# Patient Record
Sex: Female | Born: 1945 | Race: White | Hispanic: No | Marital: Married | Smoking: Former smoker
Health system: Southern US, Community
[De-identification: ages and names within clinical notes are randomized; demographics above are authoritative.]

## PROBLEM LIST (undated history)

## (undated) DIAGNOSIS — U071 COVID-19: Secondary | ICD-10-CM

## (undated) DIAGNOSIS — Z91018 Allergy to other foods: Secondary | ICD-10-CM

## (undated) DIAGNOSIS — I7300 Raynaud's syndrome without gangrene: Secondary | ICD-10-CM

## (undated) DIAGNOSIS — I872 Venous insufficiency (chronic) (peripheral): Secondary | ICD-10-CM

## (undated) DIAGNOSIS — E785 Hyperlipidemia, unspecified: Secondary | ICD-10-CM

## (undated) DIAGNOSIS — M359 Systemic involvement of connective tissue, unspecified: Secondary | ICD-10-CM

## (undated) DIAGNOSIS — R7989 Other specified abnormal findings of blood chemistry: Secondary | ICD-10-CM

## (undated) DIAGNOSIS — R945 Abnormal results of liver function studies: Secondary | ICD-10-CM

## (undated) DIAGNOSIS — I739 Peripheral vascular disease, unspecified: Secondary | ICD-10-CM

## (undated) DIAGNOSIS — J439 Emphysema, unspecified: Secondary | ICD-10-CM

## (undated) DIAGNOSIS — K648 Other hemorrhoids: Secondary | ICD-10-CM

## (undated) DIAGNOSIS — T7840XA Allergy, unspecified, initial encounter: Secondary | ICD-10-CM

## (undated) DIAGNOSIS — K635 Polyp of colon: Secondary | ICD-10-CM

## (undated) DIAGNOSIS — D6852 Prothrombin gene mutation: Secondary | ICD-10-CM

## (undated) DIAGNOSIS — IMO0002 Reserved for concepts with insufficient information to code with codable children: Secondary | ICD-10-CM

## (undated) DIAGNOSIS — I776 Arteritis, unspecified: Secondary | ICD-10-CM

## (undated) DIAGNOSIS — M1990 Unspecified osteoarthritis, unspecified site: Secondary | ICD-10-CM

## (undated) DIAGNOSIS — J45909 Unspecified asthma, uncomplicated: Secondary | ICD-10-CM

## (undated) DIAGNOSIS — R87619 Unspecified abnormal cytological findings in specimens from cervix uteri: Secondary | ICD-10-CM

## (undated) DIAGNOSIS — J189 Pneumonia, unspecified organism: Secondary | ICD-10-CM

## (undated) DIAGNOSIS — I10 Essential (primary) hypertension: Secondary | ICD-10-CM

## (undated) HISTORY — DX: Polyp of colon: K63.5

## (undated) HISTORY — PX: DILATION AND CURETTAGE OF UTERUS: SHX78

## (undated) HISTORY — DX: Unspecified abnormal cytological findings in specimens from cervix uteri: R87.619

## (undated) HISTORY — DX: Other hemorrhoids: K64.8

## (undated) HISTORY — PX: OTHER SURGICAL HISTORY: SHX169

## (undated) HISTORY — DX: Hyperlipidemia, unspecified: E78.5

## (undated) HISTORY — DX: Arteritis, unspecified: I77.6

## (undated) HISTORY — PX: COLONOSCOPY: SHX174

## (undated) HISTORY — PX: ABLATION SAPHENOUS VEIN W/ RFA: SUR11

## (undated) HISTORY — DX: Reserved for concepts with insufficient information to code with codable children: IMO0002

## (undated) HISTORY — DX: Prothrombin gene mutation: D68.52

## (undated) HISTORY — DX: Abnormal results of liver function studies: R94.5

## (undated) HISTORY — DX: Peripheral vascular disease, unspecified: I73.9

## (undated) HISTORY — DX: Other specified abnormal findings of blood chemistry: R79.89

## (undated) HISTORY — DX: Venous insufficiency (chronic) (peripheral): I87.2

## (undated) HISTORY — DX: Unspecified osteoarthritis, unspecified site: M19.90

## (undated) HISTORY — PX: PELVIC LAPAROSCOPY: SHX162

## (undated) HISTORY — DX: Systemic involvement of connective tissue, unspecified: M35.9

## (undated) HISTORY — PX: MASS EXCISION: SHX2000

## (undated) HISTORY — PX: LASER ABLATION: SHX1947

## (undated) HISTORY — DX: Raynaud's syndrome without gangrene: I73.00

## (undated) HISTORY — DX: Essential (primary) hypertension: I10

## (undated) HISTORY — DX: Allergy, unspecified, initial encounter: T78.40XA

---

## 1968-03-31 HISTORY — PX: CERVIX LESION DESTRUCTION: SHX591

## 1977-03-31 HISTORY — PX: ABDOMINAL HYSTERECTOMY: SHX81

## 2000-04-09 ENCOUNTER — Encounter: Admission: RE | Admit: 2000-04-09 | Discharge: 2000-04-09 | Payer: Self-pay | Admitting: Neurology

## 2000-04-09 ENCOUNTER — Encounter: Payer: Self-pay | Admitting: Neurology

## 2000-06-18 ENCOUNTER — Other Ambulatory Visit: Admission: RE | Admit: 2000-06-18 | Discharge: 2000-06-18 | Payer: Self-pay | Admitting: Obstetrics and Gynecology

## 2001-07-09 ENCOUNTER — Encounter: Payer: Self-pay | Admitting: Internal Medicine

## 2002-09-22 ENCOUNTER — Encounter: Payer: Self-pay | Admitting: Internal Medicine

## 2002-09-22 ENCOUNTER — Encounter: Admission: RE | Admit: 2002-09-22 | Discharge: 2002-09-22 | Payer: Self-pay | Admitting: Internal Medicine

## 2002-12-12 ENCOUNTER — Encounter: Payer: Self-pay | Admitting: Internal Medicine

## 2004-11-11 ENCOUNTER — Ambulatory Visit: Payer: Self-pay | Admitting: Internal Medicine

## 2004-11-13 ENCOUNTER — Ambulatory Visit: Payer: Self-pay | Admitting: Internal Medicine

## 2005-06-17 ENCOUNTER — Ambulatory Visit: Payer: Self-pay | Admitting: Internal Medicine

## 2006-01-27 ENCOUNTER — Ambulatory Visit: Payer: Self-pay | Admitting: Internal Medicine

## 2006-01-27 LAB — CONVERTED CEMR LAB
ALT: 22 units/L (ref 0–40)
AST: 24 units/L (ref 0–37)
Albumin: 3.4 g/dL — ABNORMAL LOW (ref 3.5–5.2)
Alkaline Phosphatase: 55 units/L (ref 39–117)
BUN: 12 mg/dL (ref 6–23)
Basophils Absolute: 0 10*3/uL (ref 0.0–0.1)
Basophils Relative: 0.1 % (ref 0.0–1.0)
CO2: 28 meq/L (ref 19–32)
Calcium: 9.1 mg/dL (ref 8.4–10.5)
Chloride: 107 meq/L (ref 96–112)
Chol/HDL Ratio, serum: 4.3
Cholesterol: 207 mg/dL (ref 0–200)
Creatinine, Ser: 1 mg/dL (ref 0.4–1.2)
Eosinophil percent: 3.1 % (ref 0.0–5.0)
GFR calc non Af Amer: 61 mL/min
Glomerular Filtration Rate, Af Am: 73 mL/min/{1.73_m2}
Glucose, Bld: 83 mg/dL (ref 70–99)
HCT: 38.1 % (ref 36.0–46.0)
HDL: 47.8 mg/dL (ref >39.0)
Hemoglobin: 12.9 g/dL (ref 12.0–15.0)
LDL DIRECT: 133.6 mg/dL
Lymphocytes Relative: 47.9 % — ABNORMAL HIGH (ref 12.0–46.0)
MCHC: 33.7 g/dL (ref 30.0–36.0)
MCV: 104 fL — ABNORMAL HIGH (ref 78.0–100.0)
Monocytes Absolute: 0.4 10*3/uL (ref 0.2–0.7)
Monocytes Relative: 10.8 % (ref 3.0–11.0)
Neutro Abs: 1.4 10*3/uL (ref 1.4–7.7)
Neutrophils Relative %: 38.1 % — ABNORMAL LOW (ref 43.0–77.0)
Platelets: 283 10*3/uL (ref 150–400)
Potassium: 4 meq/L (ref 3.5–5.1)
RBC: 3.66 M/uL — ABNORMAL LOW (ref 3.87–5.11)
RDW: 12.5 % (ref 11.5–14.6)
Sodium: 142 meq/L (ref 135–145)
TSH: 1.8 microintl units/mL (ref 0.35–5.50)
Total Bilirubin: 0.6 mg/dL (ref 0.3–1.2)
Total Protein: 7.1 g/dL (ref 6.0–8.3)
Triglyceride fasting, serum: 78 mg/dL (ref 0–149)
VLDL: 16 mg/dL (ref 0–40)
WBC: 3.8 10*3/uL — ABNORMAL LOW (ref 4.5–10.5)

## 2006-02-03 ENCOUNTER — Ambulatory Visit: Payer: Self-pay | Admitting: Internal Medicine

## 2006-02-03 LAB — CONVERTED CEMR LAB
Folate: 13.3 ng/mL
Vitamin B-12: 262 pg/mL (ref 211–911)

## 2006-03-31 HISTORY — PX: OTHER SURGICAL HISTORY: SHX169

## 2006-04-21 ENCOUNTER — Ambulatory Visit: Payer: Self-pay | Admitting: Internal Medicine

## 2006-05-08 ENCOUNTER — Ambulatory Visit: Payer: Self-pay | Admitting: Internal Medicine

## 2006-09-29 ENCOUNTER — Ambulatory Visit: Payer: Self-pay

## 2006-10-09 ENCOUNTER — Ambulatory Visit: Payer: Self-pay | Admitting: Internal Medicine

## 2006-10-16 ENCOUNTER — Ambulatory Visit: Payer: Self-pay | Admitting: Internal Medicine

## 2006-10-19 ENCOUNTER — Ambulatory Visit: Payer: Self-pay | Admitting: Internal Medicine

## 2006-10-19 LAB — CONVERTED CEMR LAB
Basophils Absolute: 0 10*3/uL (ref 0.0–0.1)
Basophils Relative: 1.1 % — ABNORMAL HIGH (ref 0.0–1.0)
ENA SM Ab Ser-aCnc: <0.2 (ref <1.0)
Eosinophils Absolute: 0.2 10*3/uL (ref 0.0–0.6)
Eosinophils Relative: 3.8 % (ref 0.0–5.0)
HCT: 38.8 % (ref 36.0–46.0)
Hemoglobin: 13.3 g/dL (ref 12.0–15.0)
Lymphocytes Relative: 46.4 % — ABNORMAL HIGH (ref 12.0–46.0)
MCHC: 34.3 g/dL (ref 30.0–36.0)
MCV: 102.5 fL — ABNORMAL HIGH (ref 78.0–100.0)
Monocytes Absolute: 0.5 10*3/uL (ref 0.2–0.7)
Monocytes Relative: 10.3 % (ref 3.0–11.0)
Neutro Abs: 1.7 10*3/uL (ref 1.4–7.7)
Neutrophils Relative %: 38.4 % — ABNORMAL LOW (ref 43.0–77.0)
Platelets: 296 10*3/uL (ref 150–400)
RBC: 3.78 M/uL — ABNORMAL LOW (ref 3.87–5.11)
RDW: 13.1 % (ref 11.5–14.6)
Scleroderma (Scl-70) (ENA) Antibody, IgG: <0.2 (ref <1.0)
WBC: 4.4 10*3/uL — ABNORMAL LOW (ref 4.5–10.5)
ds DNA Ab: <1 (ref <5)

## 2006-10-20 ENCOUNTER — Encounter: Payer: Self-pay | Admitting: Internal Medicine

## 2006-10-26 ENCOUNTER — Encounter: Payer: Self-pay | Admitting: Internal Medicine

## 2006-10-30 ENCOUNTER — Ambulatory Visit: Payer: Self-pay | Admitting: Vascular Surgery

## 2006-10-30 ENCOUNTER — Encounter: Payer: Self-pay | Admitting: Internal Medicine

## 2006-11-10 ENCOUNTER — Telehealth: Payer: Self-pay | Admitting: Internal Medicine

## 2006-11-18 ENCOUNTER — Ambulatory Visit: Payer: Self-pay | Admitting: Internal Medicine

## 2006-11-19 ENCOUNTER — Telehealth: Payer: Self-pay | Admitting: Internal Medicine

## 2006-11-19 DIAGNOSIS — I10 Essential (primary) hypertension: Secondary | ICD-10-CM | POA: Insufficient documentation

## 2007-01-06 ENCOUNTER — Encounter: Payer: Self-pay | Admitting: Internal Medicine

## 2007-07-07 ENCOUNTER — Encounter: Payer: Self-pay | Admitting: Internal Medicine

## 2007-09-29 ENCOUNTER — Encounter: Payer: Self-pay | Admitting: Internal Medicine

## 2007-10-12 ENCOUNTER — Ambulatory Visit: Payer: Self-pay | Admitting: Internal Medicine

## 2007-10-22 ENCOUNTER — Ambulatory Visit: Payer: Self-pay | Admitting: Internal Medicine

## 2007-11-23 ENCOUNTER — Encounter: Payer: Self-pay | Admitting: Internal Medicine

## 2007-11-29 ENCOUNTER — Ambulatory Visit: Payer: Self-pay | Admitting: Internal Medicine

## 2007-11-29 DIAGNOSIS — I999 Unspecified disorder of circulatory system: Secondary | ICD-10-CM | POA: Insufficient documentation

## 2007-12-15 ENCOUNTER — Encounter: Payer: Self-pay | Admitting: Internal Medicine

## 2008-07-12 ENCOUNTER — Encounter: Payer: Self-pay | Admitting: Internal Medicine

## 2008-11-08 ENCOUNTER — Encounter (INDEPENDENT_AMBULATORY_CARE_PROVIDER_SITE_OTHER): Payer: Self-pay | Admitting: *Deleted

## 2009-01-24 ENCOUNTER — Emergency Department (HOSPITAL_COMMUNITY): Admission: EM | Admit: 2009-01-24 | Discharge: 2009-01-24 | Payer: Self-pay | Admitting: Emergency Medicine

## 2009-01-24 ENCOUNTER — Ambulatory Visit: Payer: Self-pay | Admitting: Internal Medicine

## 2009-01-24 DIAGNOSIS — Z860100 Personal history of colon polyps, unspecified: Secondary | ICD-10-CM | POA: Insufficient documentation

## 2009-01-24 DIAGNOSIS — K648 Other hemorrhoids: Secondary | ICD-10-CM

## 2009-01-24 HISTORY — DX: Other hemorrhoids: K64.8

## 2009-01-25 ENCOUNTER — Encounter: Payer: Self-pay | Admitting: Internal Medicine

## 2009-01-25 ENCOUNTER — Ambulatory Visit: Payer: Self-pay | Admitting: Internal Medicine

## 2009-01-26 LAB — CONVERTED CEMR LAB
Alkaline Phosphatase: 52 units/L (ref 39–117)
Amylase: 109 units/L (ref 27–131)
Bilirubin, Direct: 0.1 mg/dL (ref 0.0–0.3)
Sed Rate: 46 mm/hr — ABNORMAL HIGH (ref 0–22)

## 2009-01-29 ENCOUNTER — Telehealth: Payer: Self-pay | Admitting: Internal Medicine

## 2009-02-05 ENCOUNTER — Encounter: Payer: Self-pay | Admitting: Internal Medicine

## 2009-02-14 ENCOUNTER — Ambulatory Visit: Payer: Self-pay | Admitting: Internal Medicine

## 2009-02-15 LAB — CONVERTED CEMR LAB
AST: 62 units/L — ABNORMAL HIGH (ref 0–37)
Folate: 20 ng/mL
Vitamin B-12: 463 pg/mL (ref 211–911)

## 2009-03-07 ENCOUNTER — Telehealth: Payer: Self-pay | Admitting: Internal Medicine

## 2009-03-21 ENCOUNTER — Ambulatory Visit: Payer: Self-pay | Admitting: Internal Medicine

## 2009-04-09 ENCOUNTER — Ambulatory Visit: Payer: Self-pay | Admitting: Internal Medicine

## 2009-05-04 ENCOUNTER — Ambulatory Visit: Payer: Self-pay | Admitting: Internal Medicine

## 2009-05-07 LAB — CONVERTED CEMR LAB
ALT: 84 units/L — ABNORMAL HIGH (ref 0–35)
AST: 60 units/L — ABNORMAL HIGH (ref 0–37)
Alkaline Phosphatase: 61 units/L (ref 39–117)
Bilirubin, Direct: 0.1 mg/dL (ref 0.0–0.3)
Iron: 117 ug/dL (ref 42–145)
Saturation Ratios: 38.1 % (ref 20.0–50.0)
Total Bilirubin: 0.6 mg/dL (ref 0.3–1.2)
Total Protein: 7.3 g/dL (ref 6.0–8.3)

## 2009-05-09 ENCOUNTER — Encounter: Payer: Self-pay | Admitting: Internal Medicine

## 2009-08-29 ENCOUNTER — Telehealth: Payer: Self-pay | Admitting: Internal Medicine

## 2009-09-12 ENCOUNTER — Telehealth: Payer: Self-pay | Admitting: Internal Medicine

## 2009-09-18 ENCOUNTER — Encounter: Payer: Self-pay | Admitting: Internal Medicine

## 2009-09-18 ENCOUNTER — Telehealth: Payer: Self-pay | Admitting: Internal Medicine

## 2009-09-27 ENCOUNTER — Telehealth: Payer: Self-pay | Admitting: Internal Medicine

## 2009-10-19 ENCOUNTER — Ambulatory Visit: Payer: Self-pay | Admitting: Internal Medicine

## 2009-10-20 LAB — CONVERTED CEMR LAB
ALT: 65 units/L — ABNORMAL HIGH (ref 0–35)
AST: 48 units/L — ABNORMAL HIGH (ref 0–37)
Albumin: 3.6 g/dL (ref 3.5–5.2)
Alkaline Phosphatase: 57 units/L (ref 39–117)
Bilirubin, Direct: 0.1 mg/dL (ref 0.0–0.3)
Total Protein: 6.7 g/dL (ref 6.0–8.3)

## 2009-10-23 ENCOUNTER — Encounter: Payer: Self-pay | Admitting: Internal Medicine

## 2009-10-29 LAB — CONVERTED CEMR LAB
IgA: 480 mg/dL — ABNORMAL HIGH (ref 68–378)
IgG (Immunoglobin G), Serum: 1370 mg/dL (ref 694–1618)

## 2009-11-19 ENCOUNTER — Encounter: Admission: RE | Admit: 2009-11-19 | Discharge: 2009-11-19 | Payer: Self-pay | Admitting: Obstetrics and Gynecology

## 2009-11-20 ENCOUNTER — Encounter: Admission: RE | Admit: 2009-11-20 | Discharge: 2009-11-20 | Payer: Self-pay | Admitting: Obstetrics and Gynecology

## 2009-12-24 ENCOUNTER — Telehealth: Payer: Self-pay | Admitting: Internal Medicine

## 2010-02-04 ENCOUNTER — Ambulatory Visit: Payer: Self-pay | Admitting: Internal Medicine

## 2010-02-04 DIAGNOSIS — E785 Hyperlipidemia, unspecified: Secondary | ICD-10-CM | POA: Insufficient documentation

## 2010-02-04 LAB — CONVERTED CEMR LAB
Folate: 15.3 ng/mL
Vitamin B-12: 393 pg/mL (ref 211–911)

## 2010-02-05 LAB — CONVERTED CEMR LAB
AST: 54 units/L — ABNORMAL HIGH (ref 0–37)
Alkaline Phosphatase: 73 units/L (ref 39–117)
BUN: 17 mg/dL (ref 6–23)
CO2: 29 meq/L (ref 19–32)
Creatinine, Ser: 1.2 mg/dL (ref 0.4–1.2)
Eosinophils Relative: 1.8 % (ref 0.0–5.0)
Glucose, Bld: 82 mg/dL (ref 70–99)
HCT: 41.6 % (ref 36.0–46.0)
LDL Cholesterol: 67 mg/dL (ref 0–99)
Monocytes Absolute: 0.5 10*3/uL (ref 0.1–1.0)
Total CHOL/HDL Ratio: 2
Triglycerides: 70 mg/dL (ref 0.0–149.0)
VLDL: 14 mg/dL (ref 0.0–40.0)

## 2010-04-21 ENCOUNTER — Encounter: Payer: Self-pay | Admitting: Internal Medicine

## 2010-04-28 LAB — CONVERTED CEMR LAB
AST: 31 units/L (ref 0–37)
Alkaline Phosphatase: 59 units/L (ref 39–117)
Bilirubin, Direct: 0.1 mg/dL (ref 0.0–0.3)
Calcium: 9.8 mg/dL (ref 8.4–10.5)
Chloride: 106 meq/L (ref 96–112)
Creatinine, Ser: 1.1 mg/dL (ref 0.4–1.2)
Eosinophils Absolute: 0 10*3/uL (ref 0.0–0.7)
Eosinophils Relative: 1.3 % (ref 0.0–5.0)
GFR calc Af Amer: 65 mL/min
GFR calc non Af Amer: 54 mL/min
Lymphocytes Relative: 43.1 % (ref 12.0–46.0)
MCV: 103.8 fL — ABNORMAL HIGH (ref 78.0–100.0)
Neutrophils Relative %: 45.7 % (ref 43.0–77.0)
Potassium: 4.3 meq/L (ref 3.5–5.1)
RBC: 3.44 M/uL — ABNORMAL LOW (ref 3.87–5.11)
RDW: 13.4 % (ref 11.5–14.6)
Total Bilirubin: 0.5 mg/dL (ref 0.3–1.2)
Total Protein: 7.5 g/dL (ref 6.0–8.3)
Triglycerides: 56 mg/dL (ref 0–149)
VLDL: 11 mg/dL (ref 0–40)
WBC: 3.2 10*3/uL — ABNORMAL LOW (ref 4.5–10.5)

## 2010-04-30 NOTE — Letter (Signed)
Summary: Need [facility]  [address] [place], [place] [address]   Phone: [phone]  Fax: [phone]              September 18, 2009 MRN: [id number]    Kari Welch [address] [place], [place]  [address]    Dear Ms. Jambor,  Our office has made several attempts to reach you in regards to coming in for follow up labs to check on your liver. To date, we still have not seen any labwork completed. It is extremely important that we follow these tests especially in view of the fact that you have had chronic elevation of these tests previously as well as a history of pancreatitis. Please come for this labwork as soon as possible or call our office should you have any questions. We look forward in continuing to participate in your healthcare needs.  Sincerely,     Hedwig Morton. Juanda Chance, MD

## 2010-04-30 NOTE — Progress Notes (Signed)
Summary: tetanus?  Phone Note Call from Patient Call back at Home Phone [phone] Call back at [facility]   Caller: vm Summary of Call: Last tetanus shot.   None on record.  Patient aware.  Says she will get it tomorrow at [facility] appointment.  Asked her to call us back when she has had it, so we can get it on our record also.  Rudy Jew, RN  December 24, 2009 12:43 PM  Initial call taken by: Rudy Jew, RN,  December 24, 2009 9:52 AM

## 2010-04-30 NOTE — Assessment & Plan Note (Signed)
Summary: ears clogged/njr   Vital Signs:  Patient profile:   65 year old female Weight:      130 pounds Temp:     97.7 degrees F Pulse rate:   64 / minute Resp:     12 per minute BP sitting:   114 / 70  (left arm)  Vitals Entered By: Gladis Riffle, RN (April 09, 2009 10:34 AM)   Primary Care Provider:  Birdie Sons, MD   History of Present Illness: feels as if ears are clogged she uses q-tips frequently admits to decreased hearing needs somee meds refilled  .ncoo   Preventive Screening-Counseling & Management  Alcohol-Tobacco     Smoking Status: quit     Year Quit: 1985  Current Problems (verified): 1)  Internal Hemorrhoids  (ICD-455.0) 2)  Colonic Polyps, Hyperplastic, Hx of  (ICD-V12.72) 3)  Hx of Splenic Infarction  (ICD-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 Take One By Mouth Once Daily 2)  Felodipine 10 Mg Xr24h-Tab (Felodipine) .... One Tablet By Mouth Two Times A Day 3)  Triamterene-Hctz 37.5-25 Mg Caps (Triamterene-Hctz) .... Take 1 Capsule By Mouth Once A Day 4)  Baby Aspirin 81 Mg  Chew (Aspirin) .... One Qid 5)  Vivelle-Dot 0.05 Mg/24hr Pttw (Estradiol) .... Change Twice Weekly 6)  Pletal 100 Mg  Tabs (Cilostazol) .Marland Kitchen.. 1 By Mouth Two Times A Day 7)  Zocor 40 Mg Tabs (Simvastatin) .Marland Kitchen.. 1 By Mouth Twice Weekly 8)  Isosorbide Mononitrate Cr 60 Mg Tb24 (Isosorbide Mononitrate) .Marland Kitchen.. 1 By Mouth Once Daily 9)  Daily Multiple Vitamins  Tabs (Multiple Vitamin) .... Once Daily 10)  Vitamin D 1000 Unit Tabs (Cholecalciferol) .... One Tablet By Mouth Once Daily 11)  Klor-Con M20 20 Meq Cr-Tabs (Potassium Chloride Crys Cr) .... One Tablet By Mouth Once Daily 12)  Hydrocodone-Acetaminophen 5-500 Mg Tabs (Hydrocodone-Acetaminophen) .... Take One Every Eight Hours As Needed Migraines, Back Pain or Leg Pain 13)   Alprazolam 0.5 Mg Tabs (Alprazolam) .... Take 1 Tablet By Mouth Once A Day As Needed  Allergies: 1)  ! Prednisone (Prednisone)  Comments:  Nurse/Medical Assistant: c/o bilateral ear clooging x 1 month  The patient's medications and allergies were reviewed with the patient and were updated in the Medication and Allergy Lists. Gladis Riffle, RN (April 09, 2009 10:35 AM)  Past History:  Past Medical History: Last updated: 01/24/2009 she is being evaluated for possibility of vasculitis Hypertension Headache, migraine  Family Hx of COLON CANCER (ICD-153.9) INTERNAL HEMORRHOIDS (ICD-455.0) COLONIC POLYPS, HYPERPLASTIC, HX OF (ICD-V12.72) Hx of SPLENIC INFARCTION (ICD-289.59) BACK PAIN (ICD-724.5) VASCULITIS (ICD-447.6) HEADACHE (ICD-784.0) HYPERTENSION, ESSENTIAL NOS (ICD-401.9) PAIN, EXTREMITY (ICD-729.5)    Past Surgical History: Last updated: 01/24/2009 Hysterectomy--fibroids, ovarian cyst (2/3 one ovary remaining) 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 Excision of mass in left hand sympthectomy left hand  Family History: Last updated: 02/14/2009 Family History of Stroke F 1st degree relative <60--in 60's Family History of Stroke M 1st degree relative <50--in 60's 2 brothers, 1 sister healthy Family History of Colon Cancer : Mother  Social History: Last updated: 01/24/2009 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 Head:  normocephalic and atraumatic.   Ears:  bilateral cerumen impaction Skin:  turgor normal and color normal.     Impression & Recommendations:  Problem # 1:  CERUMEN IMPACTION (ICD-380.4) discussed ears---bilateral successfully  irrigated discussed avoiding q-tips  Complete Medication List: 1)  Fluoxetine Hcl 20 Mg Caps (Fluoxetine hcl) .... Take one by mouth once daily 2)  Felodipine 10 Mg Xr24h-tab (Felodipine) .... One tablet by mouth two times a day 3)  Triamterene-hctz 37.5-25 Mg Caps (Triamterene-hctz) .... Take 1 capsule by mouth once a day 4)  Baby Aspirin 81 Mg Chew (Aspirin) .... One qid 5)  Vivelle-dot 0.05 Mg/24hr Pttw (Estradiol) .... Change twice weekly 6)  Pletal 100 Mg Tabs (Cilostazol) .Marland Kitchen.. 1 by mouth two times a day 7)  Zocor 40 Mg Tabs (Simvastatin) .Marland Kitchen.. 1 by mouth twice weekly 8)  Isosorbide Mononitrate Cr 60 Mg Tb24 (Isosorbide mononitrate) .Marland Kitchen.. 1 by mouth once daily 9)  Daily Multiple Vitamins Tabs (Multiple vitamin) .... Once daily 10)  Vitamin D 1000 Unit Tabs (Cholecalciferol) .... One tablet by mouth once daily 11)  Klor-con M20 20 Meq Cr-tabs (Potassium chloride crys cr) .... One tablet by mouth once daily 12)  Hydrocodone-acetaminophen 5-500 Mg Tabs (Hydrocodone-acetaminophen) .... Take one every eight hours as needed migraines, back pain or leg pain 13)  Alprazolam 0.5 Mg Tabs (Alprazolam) .... Take 1 tablet by mouth once a day as needed Prescriptions: ALPRAZOLAM 0.5 MG TABS (ALPRAZOLAM) Take 1 tablet by mouth once a day as needed  #30 x 0   Entered by:   Gladis Riffle, RN   Authorized by:   Birdie Sons MD   Signed by:   Gladis Riffle, RN on 04/09/2009   Method used:   Print then Give to Patient   RxID:   [id number]

## 2010-04-30 NOTE — Assessment & Plan Note (Signed)
Summary: fup on meds//ccm   Vital Signs:  Patient profile:   65 year old female Weight:      133 pounds BMI:     24.81 Temp:     97.6 degrees F oral Pulse rate:   84 / minute Pulse rhythm:   regular BP sitting:   96 / 60  (left arm) Cuff size:   regular  Vitals Entered By: Alfred Levins, CMA (February 04, 2010 11:54 AM)  Primary Care Provider:  Birdie Sons, MD   History of Present Illness: not seen in quite some time.  Patient comes in for followup of multiple medical problems. She is most concerned about renewing medications.  left leg pain from hip to knee. significant pain with walking, going up or down stairs. She thinks sxs coming from hip  lipids---needs f/u  review of systems: Patient does admit to some fatigue. She does fairly well with her chronic pain syndrome. She hasn't had any significant rashes or pain of her hands in quite some time. No other complaints in a complete review of systems.  Current Medications (verified): 1)  Fluoxetine Hcl 20 Mg Caps (Fluoxetine Hcl) .... Take One By Mouth Once Daily 2)  Felodipine 10 Mg Xr24h-Tab (Felodipine) .... One Tablet By Mouth Two Times A Day 3)  Triamterene-Hctz 37.5-25 Mg Caps (Triamterene-Hctz) .... Take 1 Capsule By Mouth Once A Day 4)  Baby Aspirin 81 Mg  Chew (Aspirin) .... One Qid 5)  Vivelle-Dot 0.05 Mg/24hr Pttw (Estradiol) .... Change Twice Weekly 6)  Pletal 100 Mg  Tabs (Cilostazol) .Marland Kitchen.. 1 By Mouth Two Times A Day 7)  Lipitor 40 Mg Tabs (Atorvastatin Calcium) .... Take 1 Tab By Mouth Daily 8)  Isosorbide Mononitrate Cr 60 Mg Tb24 (Isosorbide Mononitrate) .Marland Kitchen.. 1 By Mouth Once Daily 9)  Daily Multiple Vitamins  Tabs (Multiple Vitamin) .... Once Daily 10)  Vitamin D 1000 Unit Tabs (Cholecalciferol) .... One Tablet By Mouth Once Daily 11)  Hydrocodone-Acetaminophen 5-500 Mg Tabs (Hydrocodone-Acetaminophen) .... Take One Every Eight Hours As Needed Migraines, Back Pain or Leg Pain 12)  Alprazolam 0.5 Mg Tabs  (Alprazolam) .... Take 1 Tablet By Mouth Once A Day As Needed 13)  Magnesium 200 Mg Tabs (Magnesium) .... Once Daily 14)  Calcium Carbonate-Vitamin D 600-400 Mg-Unit  Tabs (Calcium Carbonate-Vitamin D) .... Once Daily 15)  Zinc 100 Mg Tabs (Zinc) .... Once Daily 16)  Osteo Bi-Flex Joint Shield  Tabs (Misc Natural Products) .... Once Daily  Allergies (verified): 1)  ! 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 HEENT exam atraumatic, normocephalic, neck is supple. Chest clear auscultation. Cardiac  exam S1-S2 are regular. Abdominal exam active bowel sounds, soft her extremities there is no clubbing or edema.   Impression & Recommendations:  Problem # 1:  HYPERTENSION, ESSENTIAL NOS (ICD-401.9) well-controlled. Continue current medications. Part of her medication regimen is to across her known vasculitis. Her updated medication list for  this problem includes:    Felodipine 10 Mg Xr24h-tab (Felodipine) ..... One tablet by mouth two times a day    Triamterene-hctz 37.5-25 Mg Caps (Triamterene-hctz) .Marland Kitchen... Take 1 capsule by mouth once a day  BP today: 96/60 Prior BP: 114/70 (04/09/2009)  Labs Reviewed: K+: 4.2 (02/14/2009) Creat: : 1.1 (11/29/2007)   Chol: 117 (11/29/2007)   HDL: 73.7 (11/29/2007)   LDL: 32 (11/29/2007)   TG: 56 (11/29/2007)  Orders: TLB-BMP (Basic Metabolic Panel-BMET) (80048-METABOL)  Problem # 2:  HYPERLIPIDEMIA (ICD-272.4)  changed from zocor at [facility] Her updated medication list for this problem includes:    Lipitor 40 Mg Tabs (Atorvastatin calcium) .Marland Kitchen... Take 1 tab by mouth daily  Orders: Venipuncture ([address]) Specimen Handling (528[address]) TLB-Lipid Panel (80061-LIPID) TLB-Hepatic/Liver Function Pnl (80076-HEPATIC)  Problem # 3:  Hx of SPLENIC INFARCTION (ICD-289.59) followed at [facility]  Complete Medication List: 1)  Fluoxetine Hcl 20 Mg Caps (Fluoxetine hcl) .... Take one by mouth once daily 2)  Felodipine 10 Mg Xr24h-tab (Felodipine) .... One tablet by mouth two times a day 3)  Triamterene-hctz 37.5-25 Mg Caps (Triamterene-hctz) .... Take 1 capsule by mouth once a day 4)  Baby Aspirin 81 Mg Chew (Aspirin) .... One qid 5)  Vivelle-dot 0.05 Mg/24hr Pttw (Estradiol) .... Change twice weekly 6)  Pletal 100 Mg Tabs (Cilostazol) .Marland Kitchen.. 1 by mouth two times a day 7)  Lipitor 40 Mg Tabs (Atorvastatin calcium) .... Take 1 tab by mouth daily 8)  Isosorbide Mononitrate Cr 60 Mg Tb24 (Isosorbide mononitrate) .Marland Kitchen.. 1 by mouth once daily 9)  Daily Multiple Vitamins Tabs (Multiple vitamin) .... Once daily 10)  Vitamin D 1000 Unit Tabs (Cholecalciferol) .... One tablet by mouth once daily 11)  Hydrocodone-acetaminophen 5-500 Mg Tabs (Hydrocodone-acetaminophen) .... Take one every eight hours as needed migraines, back pain or leg pain 12)  Alprazolam 0.5 Mg Tabs (Alprazolam) .... Take 1 tablet by mouth once a day as  needed 13)  Magnesium 200 Mg Tabs (Magnesium) .... Once daily 14)  Calcium Carbonate-vitamin D 600-400 Mg-unit Tabs (Calcium carbonate-vitamin d) .... Once daily 15)  Zinc 100 Mg Tabs (Zinc) .... Once daily 16)  Osteo Bi-flex Joint Shield Tabs (Misc natural products) .... Once daily  Other Orders: TLB-CBC Platelet - w/Differential (85025-CBCD) Prescriptions: HYDROCODONE-ACETAMINOPHEN 5-500 MG TABS (HYDROCODONE-ACETAMINOPHEN) Take one every eight hours as needed migraines, back pain or leg pain  #30 x 0   Entered and Authorized by:   Birdie Sons MD   Signed by:   Birdie Sons MD on 02/04/2010   Method used:   Print then Give to Patient   RxID:   [id number] ALPRAZOLAM 0.5 MG TABS (ALPRAZOLAM) Take 1 tablet by mouth once a day as needed  #30 x 0   Entered and Authorized by:   Birdie Sons MD   Signed by:   Birdie Sons MD on 02/04/2010   Method used:   Print then Give to Patient   RxID:   [id number]    Orders Added: 1)  Venipuncture [33295] 2)  Specimen Handling [99000] 3)  TLB-Lipid Panel [80061-LIPID] 4)  TLB-Hepatic/Liver Function Pnl [80076-HEPATIC] 5)  TLB-BMP (Basic Metabolic Panel-BMET) [80048-METABOL] 6)  TLB-CBC Platelet - w/Differential [85025-CBCD] 7)  Est. Patient Level IV [id number]

## 2010-04-30 NOTE — Progress Notes (Signed)
Summary: REFILL REQUEST (Vicodin)  Phone Note Refill Request Message from:  Fax from Pharmacy on August 29, 2009 10:01 AM  Refills Requested: Medication #1:  HYDROCODONE-ACETAMINOPHEN 5-500 MG TABS Take one every eight hours as needed migraines   Notes: [facility] - Qty-30.  Medication #2:  ALPRAZOLAM 0.5 MG TABS Take 1 tablet by mouth once a day as needed.   Notes: [facility] - Qty-30.    Initial call taken by: Debbra Riding,  August 29, 2009 10:03 AM  Follow-up for Phone Call        see Rx Follow-up by: Gladis Riffle, RN,  August 29, 2009 3:33 PM    Prescriptions: ALPRAZOLAM 0.5 MG TABS (ALPRAZOLAM) Take 1 tablet by mouth once a day as needed  #30 x 3   Entered by:   Gladis Riffle, RN   Authorized by:   Birdie Sons MD   Signed by:   Gladis Riffle, RN on 08/29/2009   Method used:   Telephoned to ...       [facility] (retail)       [address] [place], [place]  [address]       Ph: [phone] or [phone]       Fax: [phone]   RxID:   [id number] HYDROCODONE-ACETAMINOPHEN 5-500 MG TABS (HYDROCODONE-ACETAMINOPHEN) Take one every eight hours as needed migraines, back pain or leg pain  #30 x 3   Entered by:   Gladis Riffle, RN   Authorized by:   Birdie Sons MD   Signed by:   Gladis Riffle, RN on 08/29/2009   Method used:   Telephoned to ...       [facility] (retail)       [address] [place], [place]  [address]       Ph: [phone] or [phone]       Fax: [phone]   RxID:   [id number]

## 2010-04-30 NOTE — Progress Notes (Signed)
Summary: Patient overdue for Labs   ---- Converted from flag ----     ---- 05/09/2009 8:24 AM, Twinkle Kitchens Nelson-Smith CMA (AAMA) wrote: pt to have labs drawn 5/11...did she?..I have left a message for the patient to call back.    ---- 09/10/2009 9:02 AM, Lamona Curl CMA (AAMA) wrote: lm for Patient to have labs drawn...   ---- 08/30/2009 8:33 AM, Miyani Kitchens Nelson-Smith CMA (AAMA) wrote: reminded Patient to have labs drawn  After multiple attempts to have patient come for labs, labs have still not been completed on patient. I will send a letter to patient and advise Dr Juanda Chance as well. Dottie Nelson-Smith CMA Duncan Dull)  September 18, 2009 8:31 AM

## 2010-04-30 NOTE — Letter (Signed)
Summary: [facility]  [facility]   Imported By: Maryln Gottron 06/27/2009 13:02:00  _____________________________________________________________________  External Attachment:    Type:   Image     Comment:   External Document

## 2010-04-30 NOTE — Progress Notes (Signed)
Summary: poison ivy  Phone Note Call from Patient   Caller: Patient Call For: Birdie Sons MD Summary of Call:  Poison ivy all over and continues to break out.  Around eyes also. Broken out between fingers, palm of hands, inside nose and all over trunk and extremities.  Cannot tolerate steroids, due to swelling.... [facility] [phone]  Benadryl, and Avino Baths have not helped.  Triamcinolone cream did not help neither. Initial call taken by: Lynann Beaver CMA,  September 12, 2009 10:59 AM  Follow-up for Phone Call        can call in triamcinolone cream 0.5 % two times a day to affected area (can be used on most severe areas, not on face) 30 grams/1 atarax 25 mg by mouth three times a day as needed pruritis #30/0 Follow-up by: Birdie Sons MD,  September 12, 2009 3:41 PM  Additional Follow-up for Phone Call Additional follow up Details #1::        Phone Call Completed Additional Follow-up by: Rudy Jew, RN,  September 12, 2009 4:50 PM    New/Updated Medications: TRIAMCINOLONE ACETONIDE 0.5 % CREA (TRIAMCINOLONE ACETONIDE) Apply two times a day affected area.  Do not use on face. HYDROXYZINE HCL 25 MG TABS (HYDROXYZINE HCL) One by mouth three times a day as needed pruritis Prescriptions: HYDROXYZINE HCL 25 MG TABS (HYDROXYZINE HCL) One by mouth three times a day as needed pruritis  #30 x 0   Entered by:   Rudy Jew, RN   Authorized by:   Birdie Sons MD   Signed by:   Rudy Jew, RN on 09/12/2009   Method used:   Electronically to        [facility]* (retail)       [address] [place], [place]  [address]       Ph: [phone] or [phone]       Fax: [phone]   RxID:   [id number] TRIAMCINOLONE ACETONIDE 0.5 % CREA (TRIAMCINOLONE ACETONIDE) Apply two times a day affected area.  Do not use on face.  #30 grams x 1   Entered by:   Rudy Jew, RN   Authorized by:   Birdie Sons MD   Signed by:   Rudy Jew, RN on 09/12/2009   Method used:   Electronically to        [facility]* (retail)       [address] [place], [place]  [address]       Ph: [phone] or [phone]       Fax: [phone]   RxID:   [id number]

## 2010-04-30 NOTE — Progress Notes (Signed)
Summary: Labs?   Phone Note Call from Patient   Caller: Dr Amelia Jo, Patient's Husband Call For: Dr Juanda Chance Summary of Call: Dr Clarisse Gouge called to request we add some tests to the hepatic panel his wife will be having drawn next week (which she is overdue for at this time.) He states that [facility] Cardiologist, Dr Coral Ceo originally wanted tests ordered at his facility, however, the incorrect ones were drawn. Per [facility]'s 05/09/09 office note, it states that patient needs "serial hepatic transaminase determinations with consideration of additional studies to include a smooth muscle antibody, microsomal antibody and serum immunoglobulins." Dr Clarisse Gouge states that we need to make certain to draw the "antimicrosomal antibody for the kidneys and liver, NOT the antimicrosomal antibody for the thyroid." Could you tell me which tests to order? Is this just our normal hepatic panel, Anti-SMA, AMA, ANA, IgG,IgM,IgA???? Results to be sent to Dr Coral Ceo at fax #[phone]. Initial call taken by: Lamona Curl CMA Duncan Dull),  September 27, 2009 3:44 PM  Follow-up for Phone Call        I have never oreder antimicrosomal antibody. We will have to look into the CPT code book when I get back in to the office.  Follow-up by: Hart Carwin MD,  September 27, 2009 11:16 PM  Additional Follow-up for Phone Call Additional follow up Details #1::        OK, so, I "googled" the test and found a "liver-kidney microsome-1 antibody"....I called [facility] who does our testing....they draw this lab but send it off to [facility]. I have spoken to Dr Clarisse Gouge and told him the exact name of the test I have found. He seems to think that this is the correct test to be ordered. [facility] lab order # is [id number]. It should be drawn in an SST tube, 1 mL of serum and it should be refrigerated. I have entered the order in the computer and will send the results to Dr Lorenda Cahill when they return. Additional Follow-up by: Lamona Curl CMA Duncan Dull),   September 28, 2009 9:58 AM     Appended Document: Labs? Lab oreder

## 2010-07-04 LAB — CBC
HCT: 39.3 % (ref 36.0–46.0)
Hemoglobin: 13.2 g/dL (ref 12.0–15.0)
MCHC: 33.7 g/dL (ref 30.0–36.0)
MCV: 105.8 fL — ABNORMAL HIGH (ref 78.0–100.0)
Platelets: 270 10*3/uL (ref 150–400)
RBC: 3.71 MIL/uL — ABNORMAL LOW (ref 3.87–5.11)
RDW: 13.7 % (ref 11.5–15.5)
WBC: 5.8 10*3/uL (ref 4.0–10.5)

## 2010-07-04 LAB — IRON AND TIBC
Saturation Ratios: 5 % — ABNORMAL LOW (ref 20–55)
TIBC: 298 ug/dL (ref 250–470)
UIBC: 282 ug/dL

## 2010-07-04 LAB — URINALYSIS, ROUTINE W REFLEX MICROSCOPIC
Hgb urine dipstick: NEGATIVE
Nitrite: NEGATIVE
Protein, ur: NEGATIVE mg/dL
Urobilinogen, UA: 0.2 mg/dL (ref 0.0–1.0)

## 2010-07-04 LAB — PROTIME-INR
INR: 0.96 (ref 0.00–1.49)
Prothrombin Time: 12.7 seconds (ref 11.6–15.2)

## 2010-07-04 LAB — COMPREHENSIVE METABOLIC PANEL
ALT: 67 U/L — ABNORMAL HIGH (ref 0–35)
AST: 47 U/L — ABNORMAL HIGH (ref 0–37)
Albumin: 3.6 g/dL (ref 3.5–5.2)
BUN: 12 mg/dL (ref 6–23)
CO2: 29 mEq/L (ref 19–32)
Calcium: 9 mg/dL (ref 8.4–10.5)
Chloride: 100 mEq/L (ref 96–112)
GFR calc non Af Amer: 53 mL/min — ABNORMAL LOW (ref >60)
Glucose, Bld: 93 mg/dL (ref 70–99)
Total Protein: 7.7 g/dL (ref 6.0–8.3)

## 2010-07-04 LAB — AMYLASE: Amylase: 136 U/L — ABNORMAL HIGH (ref 27–131)

## 2010-07-04 LAB — SEDIMENTATION RATE: Sed Rate: 54 mm/hr — ABNORMAL HIGH (ref 0–22)

## 2010-07-04 LAB — TSH: TSH: 4.594 u[IU]/mL — ABNORMAL HIGH (ref 0.350–4.500)

## 2010-08-13 NOTE — Assessment & Plan Note (Signed)
OFFICE VISIT   Kari, Welch  DOB:  11/19/1945                                       10/30/2006  CHART#:[id number]   The patient is a 65 year old female who has a 20-year history of  Raynaud's syndrome.  She had several exacerbations and remissions of  this while she was living in  in a [place].  However,  recently she has developed ulceration on the left fourth finger.  It has  been present for greater than a month and is quite painful.  She has  also had previous problems with her left third finger.  Apparently, she  has had a biopsy in the past that showed fibrovascular tissue with focal  arteritis.  She has also had a previous history of a positive rheumatoid  factor in 2000, and a positive ANA.  She has been treated with Plaquenil  in the past.  She has previously been treated for her Raynaud's with  felodipine twice a day, but cut back to once a day because of swelling  in her lower extremities.  She also has some left foot numbness and  tingling from walking.  She also seems to have some symptoms of  Raynaud's in the feet when there is cold weather.  She does have a  family history of rheumatoid arthritis in a paternal aunt and a brother.  She is a former smoker but has not smoked for almost 30 years.  She was  recently placed on nitroglycerin paste by Dr. Kellie Simmering, but is having  some headaches from this.   PAST MEDICAL HISTORY:  Otherwise remarkable for hypertension.   FAMILY HISTORY:  As mentioned above.   SOCIAL HISTORY:  She is married.  Works as an Tree surgeon.  Her husband is a  Scientist, research (medical).  She has three children.  Smoking history is as  above.  She occasionally drinks a glass of wine.   REVIEW OF SYSTEMS:  She is 5 feet 2 inches, 140 pounds.  She denies  recent history of shortness of breath or cardiac arrhythmias.  She has  no history of wheezing, but has had some bronchitis in the past.  She  denies history of dysphagia  or peptic ulcer disease.  She denies history  of renal dysfunction, TIA, stroke, or DVT.  She does have some chronic  headaches and joint pain.  She denies any history of hypercoagulable  state.   PAST SURGICAL HISTORY:  Remarkable for excision of a mass in her left  fourth finger in 09/1997.   MEDICATIONS:  1. Prozac 40 mg once a day.  2. Felodipine 5 mg once a day.  3. Lisinopril 5 mg once a day.  4. Alona patch 0.05 mg twice a week.  5. Aspirin two 81 mg once a day.  6. Nitroglycerin patch.   She has no known drug allergies.  However, she does have intolerance to  PREDNISONE, CELEBREX, NONSTEROIDALS, and LYRICA.   PHYSICAL EXAMINATION:  Blood pressure is 144/84, heart rate is 54 and  regular.  HEENT shows no changes in the external skin tissue of the face  and neck.  She has 2+ carotid pulses bilaterally.  Chest is clear to  auscultation.  Cardiac exam is regular rate and rhythm, without murmur.  She has 2+ brachial and radial pulses bilaterally.  Abdomen is soft,  nontender.  Lower extremity exam shows a 1+ dorsalis pedis and 2+  posterior tibial pulse on the right foot, a 1+ dorsalis pedis and absent  posterior tibial pulse on the left foot.  She has some mild decrease in  sensation in her toes to light touch.  On her upper extremities, the  left fourth finger has an ulceration.  There is some flexion contracture  to this and loss of the nail.  She has some smoothing and tightness of  the skin in the fingers, with blunting of the tips of the fingers.   She underwent upper extremity noninvasive vascular testing today.  This  showed normal biphasic waveforms in the right and left brachial, radial,  and ulnar arteries.  Left and right sides were fairly symmetric.  However, waveforms were fairly flat in the right third and fourth digit,  as well as the left fourth digit, and to a lesser extent the left fifth  digit.  These are both consistent with vasospastic disorder of the   digital vessels as well as obstruction of the digital vessels.  Of note,  she also recently had ABIs performed at [facility] on 09/29/2006.  ABI on  the right was 1.2, and on the left 0.92.  These were overall fairly  normal, with some atherosclerosis in both lower extremities which was  mild.   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  I agree with Dr.  Kellie Simmering that this is most likely the cause of the ulceration in her  finger.  Noninvasive tests confirmed that this is primarily a digital  artery problem and not a large vessel obstruction problem.  She also has  some Raynaud's syndrome which probably contributes to the obstruction  symptoms.  Most common cause of this is some type of connective tissue  disorder.  The most common of these is scleroderma.  However, there are  also undifferentiated connective tissue disorders which could fit this  picture.  Dr. Kellie Simmering is still in the process of trying to eliminate  these disorders as a cause for her symptoms.   Unfortunately, from a vascular surgical standpoint, there are no large  vessel obstruction apparent which we would be able to reconstruct.  I do  not believe an arteriogram is warranted at this point, since the  noninvasive tests have pretty much confirmed that this is digital  obstruction, which could be consistent with a large variety of  disorders, and the arteriogram would be nondiagnostic of any of these  and also not provide any treatment options.  She is currently receiving  the best treatment possible for her Raynaud's symptoms, including  calcium channel blockers and nitroglycerin.  Hopefully, these  ulcerations will continue to heal with conservative management with good  local wound care.  I have not scheduled her for further followup.  Please let me know if I can be of further assistance in her care.   Janetta Hora. Fields, MD  Electronically  Signed   CEF/MEDQ  D:  11/01/2006  T:  11/02/2006  Job:  [id number]   cc:   Valetta Mole. Swords, MD  Aundra Dubin, M.D.  Rene Kocher, M.D.

## 2010-08-16 ENCOUNTER — Other Ambulatory Visit: Payer: Self-pay | Admitting: Internal Medicine

## 2010-09-23 ENCOUNTER — Other Ambulatory Visit: Payer: Self-pay | Admitting: Internal Medicine

## 2010-10-29 ENCOUNTER — Other Ambulatory Visit: Payer: Self-pay | Admitting: Obstetrics and Gynecology

## 2010-10-29 DIAGNOSIS — Z1231 Encounter for screening mammogram for malignant neoplasm of breast: Secondary | ICD-10-CM

## 2010-11-22 ENCOUNTER — Ambulatory Visit: Payer: Self-pay

## 2010-12-03 ENCOUNTER — Other Ambulatory Visit: Payer: Self-pay | Admitting: Internal Medicine

## 2010-12-24 ENCOUNTER — Other Ambulatory Visit (INDEPENDENT_AMBULATORY_CARE_PROVIDER_SITE_OTHER): Payer: BC Managed Care – PPO

## 2010-12-24 DIAGNOSIS — R945 Abnormal results of liver function studies: Secondary | ICD-10-CM

## 2010-12-24 LAB — HEPATIC FUNCTION PANEL
ALT: 87 U/L — ABNORMAL HIGH (ref 0–35)
Total Bilirubin: 0.6 mg/dL (ref 0.3–1.2)
Total Protein: 7.3 g/dL (ref 6.0–8.3)

## 2010-12-24 LAB — CBC WITH DIFFERENTIAL/PLATELET
Basophils Relative: 0.5 % (ref 0.0–3.0)
Eosinophils Relative: 2.1 % (ref 0.0–5.0)
Lymphocytes Relative: 38.7 % (ref 12.0–46.0)
MCV: 107.5 fl — ABNORMAL HIGH (ref 78.0–100.0)
Neutrophils Relative %: 48.9 % (ref 43.0–77.0)
RBC: 3.73 Mil/uL — ABNORMAL LOW (ref 3.87–5.11)
WBC: 4.4 10*3/uL — ABNORMAL LOW (ref 4.5–10.5)

## 2010-12-26 LAB — PROTEIN ELECTROPHORESIS, SERUM
Albumin ELP: 54.4 % — ABNORMAL LOW (ref 55.8–66.1)
Alpha-1-Globulin: 3.9 % (ref 2.9–4.9)
Beta 2: 6.9 % — ABNORMAL HIGH (ref 3.2–6.5)
Beta Globulin: 6.8 % (ref 4.7–7.2)

## 2011-01-09 ENCOUNTER — Encounter: Payer: Self-pay | Admitting: Internal Medicine

## 2011-01-14 ENCOUNTER — Other Ambulatory Visit: Payer: Self-pay | Admitting: Internal Medicine

## 2011-01-16 ENCOUNTER — Encounter: Payer: Self-pay | Admitting: Internal Medicine

## 2011-01-17 ENCOUNTER — Ambulatory Visit (INDEPENDENT_AMBULATORY_CARE_PROVIDER_SITE_OTHER): Payer: BC Managed Care – PPO | Admitting: Internal Medicine

## 2011-01-17 ENCOUNTER — Encounter: Payer: Self-pay | Admitting: Internal Medicine

## 2011-01-17 VITALS — BP 114/72 | HR 88 | Temp 98.5°F | Ht 61.5 in | Wt 136.0 lb

## 2011-01-17 DIAGNOSIS — E785 Hyperlipidemia, unspecified: Secondary | ICD-10-CM

## 2011-01-17 DIAGNOSIS — I776 Arteritis, unspecified: Secondary | ICD-10-CM

## 2011-01-17 DIAGNOSIS — Z23 Encounter for immunization: Secondary | ICD-10-CM

## 2011-01-17 MED ORDER — HYDROCODONE-ACETAMINOPHEN 5-500 MG PO TABS: 1.0000 | ORAL_TABLET | Freq: Three times a day (TID) | ORAL | Status: DC | PRN

## 2011-01-17 MED ORDER — ALPRAZOLAM 0.5 MG PO TABS: 0.5000 mg | ORAL_TABLET | Freq: Every day | ORAL | Status: DC | PRN

## 2011-01-17 NOTE — Progress Notes (Signed)
  Subjective:    Patient ID: Kari Welch, female    DOB: 1946-01-30, 65 y.o.   MRN: [id number]  HPI  Vasculitis---sxs are reasonably well controlled  Lipids--back on statin (previously stopped related to possibility of increased LFTs)  Ankle swelling---likely related to plendil (taking for symptomatic relief of vasculitis). Tolerating intermittent lasix use  Past Medical History  Diagnosis Date  . Hypertension   . Migraine   . Colon cancer   . Splenic infarction   . Vasculitis   . Hyperlipidemia    Past Surgical History  Procedure Date  . Abdominal hysterectomy   . Dilation and curettage of uterus   . Mass excision     left hand  . Sympthectomy     left hand    reports that she has quit smoking. She does not have any smokeless tobacco history on file. She reports that she drinks about .6 ounces of alcohol per week. She reports that she does not use illicit drugs. family history includes Cancer in her mother and Stroke in her father and mother. Allergies  Allergen Reactions  . Prednisone     REACTION: short course=swelling     Review of Systems  patient denies chest pain, shortness of breath, orthopnea. Denies lower extremity edema, abdominal pain, change in appetite, change in bowel movements. Patient denies rashes, musculoskeletal complaints. No other specific complaints in a complete review of systems.      Objective:   Physical Exam  Well-developed well-nourished female in no acute distress. HEENT exam atraumatic, normocephalic, extraocular muscles are intact. Neck is supple. No jugular venous distention no thyromegaly. Chest clear to auscultation without increased work of breathing. Cardiac exam S1 and S2 are regular. Abdominal exam active bowel sounds, soft, nontender. Extremities no edema. Neurologic exam she is alert without any motor sensory deficits. Gait is normal.        Assessment & Plan:

## 2011-01-19 NOTE — Assessment & Plan Note (Signed)
Patient's symptoms are much better. She has followup at [facility]. She does complain of some lower extremity edema. I suspect this is related to felodipine. In this situation and it is better for her to continue the felodipine then risks recurrent symptoms from vasculitis.

## 2011-01-19 NOTE — Assessment & Plan Note (Signed)
She has her lipids checked at [facility] she will get me those results. If she's unable to obtain them we will get them in approximately one month. Note liver function test abnormalities. These have been ongoing. Not related to statin therapy. She is recently discontinued thiazide diuretic. We will followup laboratories in one month or so.

## 2011-01-30 ENCOUNTER — Other Ambulatory Visit (INDEPENDENT_AMBULATORY_CARE_PROVIDER_SITE_OTHER): Payer: BC Managed Care – PPO

## 2011-01-30 DIAGNOSIS — I776 Arteritis, unspecified: Secondary | ICD-10-CM

## 2011-01-30 LAB — BASIC METABOLIC PANEL
BUN: 14 mg/dL (ref 6–23)
Chloride: 105 mEq/L (ref 96–112)
Glucose, Bld: 89 mg/dL (ref 70–99)
Potassium: 4.1 mEq/L (ref 3.5–5.1)

## 2011-01-30 LAB — HEPATIC FUNCTION PANEL
ALT: 81 U/L — ABNORMAL HIGH (ref 0–35)
AST: 49 U/L — ABNORMAL HIGH (ref 0–37)
Albumin: 3.8 g/dL (ref 3.5–5.2)

## 2011-01-30 LAB — TSH: TSH: 1.43 u[IU]/mL (ref 0.35–5.50)

## 2011-01-30 LAB — CBC WITH DIFFERENTIAL/PLATELET
Basophils Absolute: 0 10*3/uL (ref 0.0–0.1)
Eosinophils Absolute: 0.1 10*3/uL (ref 0.0–0.7)
HCT: 39.6 % (ref 36.0–46.0)
Lymphocytes Relative: 37.8 % (ref 12.0–46.0)
Lymphs Abs: 1.7 10*3/uL (ref 0.7–4.0)
Monocytes Relative: 9.9 % (ref 3.0–12.0)
Platelets: 322 10*3/uL (ref 150.0–400.0)
RDW: 14.1 % (ref 11.5–14.6)

## 2011-01-31 LAB — HEPATITIS B CORE ANTIBODY, TOTAL: Hep B Core Total Ab: NEGATIVE

## 2011-01-31 LAB — HEPATITIS B CORE ANTIBODY, IGM: Hep B C IgM: NEGATIVE

## 2011-01-31 LAB — HEPATITIS C ANTIBODY: HCV Ab: NEGATIVE

## 2011-01-31 LAB — GAMMA GT: GGT: 23 U/L (ref 7–51)

## 2011-04-30 ENCOUNTER — Other Ambulatory Visit: Payer: Self-pay | Admitting: Internal Medicine

## 2011-05-01 ENCOUNTER — Other Ambulatory Visit: Payer: Self-pay | Admitting: Internal Medicine

## 2011-05-02 NOTE — Telephone Encounter (Signed)
Called into pharmacy

## 2011-05-02 NOTE — Telephone Encounter (Signed)
Cindy please call hydrocodone into [facility] [phone]

## 2011-07-30 ENCOUNTER — Other Ambulatory Visit: Payer: Self-pay | Admitting: Internal Medicine

## 2011-11-30 LAB — HM MAMMOGRAPHY

## 2011-12-11 ENCOUNTER — Other Ambulatory Visit: Payer: Self-pay | Admitting: Internal Medicine

## 2011-12-15 ENCOUNTER — Telehealth: Payer: Self-pay | Admitting: Internal Medicine

## 2011-12-15 NOTE — Telephone Encounter (Signed)
Pt is requesting appt for follow up on meds etc. Pt is requesting appt with Dr Cato Mulligan only before 01-02-2012

## 2011-12-16 NOTE — Telephone Encounter (Signed)
11:30 on 10/1

## 2011-12-16 NOTE — Telephone Encounter (Signed)
lmom for pt to call back

## 2011-12-17 NOTE — Telephone Encounter (Signed)
Pt is aware.  

## 2011-12-30 ENCOUNTER — Encounter: Payer: Self-pay | Admitting: Internal Medicine

## 2011-12-30 ENCOUNTER — Ambulatory Visit (INDEPENDENT_AMBULATORY_CARE_PROVIDER_SITE_OTHER): Payer: BC Managed Care – PPO | Admitting: Internal Medicine

## 2011-12-30 VITALS — BP 114/80 | HR 84 | Temp 97.9°F | Ht 61.5 in | Wt 129.0 lb

## 2011-12-30 DIAGNOSIS — D239 Other benign neoplasm of skin, unspecified: Secondary | ICD-10-CM

## 2011-12-30 DIAGNOSIS — I10 Essential (primary) hypertension: Secondary | ICD-10-CM

## 2011-12-30 DIAGNOSIS — E785 Hyperlipidemia, unspecified: Secondary | ICD-10-CM

## 2011-12-30 DIAGNOSIS — I776 Arteritis, unspecified: Secondary | ICD-10-CM

## 2011-12-30 LAB — CBC WITH DIFFERENTIAL/PLATELET
Basophils Relative: 0.6 % (ref 0.0–3.0)
Eosinophils Absolute: 0 10*3/uL (ref 0.0–0.7)
HCT: 41.2 % (ref 36.0–46.0)
Hemoglobin: 13.6 g/dL (ref 12.0–15.0)
Lymphocytes Relative: 28.5 % (ref 12.0–46.0)
Lymphs Abs: 1.3 10*3/uL (ref 0.7–4.0)
MCHC: 32.9 g/dL (ref 30.0–36.0)
Neutro Abs: 2.8 10*3/uL (ref 1.4–7.7)
RBC: 3.84 Mil/uL — ABNORMAL LOW (ref 3.87–5.11)

## 2011-12-30 LAB — HEPATIC FUNCTION PANEL
AST: 82 U/L — ABNORMAL HIGH (ref 0–37)
Bilirubin, Direct: 0.1 mg/dL (ref 0.0–0.3)
Total Bilirubin: 0.6 mg/dL (ref 0.3–1.2)

## 2011-12-30 LAB — LIPID PANEL
HDL: 64.7 mg/dL (ref >39.00)
Total CHOL/HDL Ratio: 2
VLDL: 9 mg/dL (ref 0.0–40.0)

## 2011-12-30 LAB — BASIC METABOLIC PANEL
CO2: 27 mEq/L (ref 19–32)
Glucose, Bld: 102 mg/dL — ABNORMAL HIGH (ref 70–99)
Potassium: 3 mEq/L — ABNORMAL LOW (ref 3.5–5.1)
Sodium: 139 mEq/L (ref 135–145)

## 2011-12-30 MED ORDER — SCOPOLAMINE 1 MG/3DAYS TD PT72
1.0000 | MEDICATED_PATCH | TRANSDERMAL | Status: DC
Start: 2011-12-30 — End: 2013-04-04

## 2011-12-30 NOTE — Progress Notes (Signed)
Patient ID: LIBBY GOEHRING, female   DOB: April 28, 1945, 66 y.o.   MRN: [id number]  Left great toe-- painful / swollen 1st mtp joint.  Dark mole. RLQ   She otherwise feels well Has regular f/u with rheumatology  Reviewed meds, pmh   Well-developed well-nourished female in no acute distress. HEENT exam atraumatic, normocephalic, extraocular muscles are intact. Neck is supple. No jugular venous distention no thyromegaly. Chest clear to auscultation without increased work of breathing. Cardiac exam S1 and S2 are regular. Abdominal exam active bowel sounds, soft, nontender. Extremities no edema. Neurologic exam she is alert without any motor sensory deficits. Gait is normal.derm- has 2.5 mm dark , irregular mole RLQ  A/P-- mole- removed- verbal consent Sterile technique Punch biopsy after lidocaine/epin  Bunion- refer ortho-- she will call for appt

## 2012-01-02 ENCOUNTER — Telehealth: Payer: Self-pay | Admitting: Family Medicine

## 2012-01-02 ENCOUNTER — Other Ambulatory Visit: Payer: Self-pay | Admitting: *Deleted

## 2012-01-02 MED ORDER — POTASSIUM CHLORIDE CRYS ER 20 MEQ PO TBCR: 20.0000 meq | EXTENDED_RELEASE_TABLET | Freq: Every day | ORAL | Status: DC

## 2012-01-02 NOTE — Telephone Encounter (Signed)
Patient notified that results are not available at this time.  Please follow up with Dr. Cato Mulligan.

## 2012-01-02 NOTE — Telephone Encounter (Signed)
Talked with pt and gave results

## 2012-01-02 NOTE — Telephone Encounter (Signed)
Pt req call back from nurse asap re: biopsy and labs.

## 2012-01-02 NOTE — Telephone Encounter (Signed)
Dr. Clarisse Gouge requesting a call back from you on Monday, 10/7 re his wife, Kari Welch. This is regarding a biopsy she had 10/1. Thank you.

## 2012-01-15 ENCOUNTER — Encounter: Payer: Self-pay | Admitting: Internal Medicine

## 2012-01-15 ENCOUNTER — Other Ambulatory Visit (INDEPENDENT_AMBULATORY_CARE_PROVIDER_SITE_OTHER): Payer: BC Managed Care – PPO

## 2012-01-15 DIAGNOSIS — K750 Abscess of liver: Secondary | ICD-10-CM

## 2012-01-15 LAB — HEPATIC FUNCTION PANEL
Alkaline Phosphatase: 60 U/L (ref 39–117)
Bilirubin, Direct: 0.1 mg/dL (ref 0.0–0.3)
Total Bilirubin: 0.5 mg/dL (ref 0.3–1.2)
Total Protein: 6.8 g/dL (ref 6.0–8.3)

## 2012-01-16 ENCOUNTER — Other Ambulatory Visit: Payer: Self-pay | Admitting: *Deleted

## 2012-01-16 MED ORDER — ALPRAZOLAM 0.5 MG PO TABS: 0.5000 mg | ORAL_TABLET | Freq: Three times a day (TID) | ORAL | Status: DC | PRN

## 2012-01-16 MED ORDER — HYDROCODONE-ACETAMINOPHEN 5-500 MG PO TABS: 1.0000 | ORAL_TABLET | Freq: Every day | ORAL | Status: DC

## 2012-01-20 ENCOUNTER — Other Ambulatory Visit: Payer: Self-pay | Admitting: Obstetrics and Gynecology

## 2012-01-20 DIAGNOSIS — Z1231 Encounter for screening mammogram for malignant neoplasm of breast: Secondary | ICD-10-CM

## 2012-01-28 ENCOUNTER — Ambulatory Visit (INDEPENDENT_AMBULATORY_CARE_PROVIDER_SITE_OTHER): Payer: BC Managed Care – PPO | Admitting: Internal Medicine

## 2012-01-28 ENCOUNTER — Encounter: Payer: Self-pay | Admitting: Internal Medicine

## 2012-01-28 VITALS — BP 122/80 | Wt 129.0 lb

## 2012-01-28 DIAGNOSIS — D239 Other benign neoplasm of skin, unspecified: Secondary | ICD-10-CM

## 2012-01-28 DIAGNOSIS — R7989 Other specified abnormal findings of blood chemistry: Secondary | ICD-10-CM

## 2012-01-28 DIAGNOSIS — D229 Melanocytic nevi, unspecified: Secondary | ICD-10-CM

## 2012-01-28 DIAGNOSIS — H6120 Impacted cerumen, unspecified ear: Secondary | ICD-10-CM

## 2012-01-29 ENCOUNTER — Ambulatory Visit
Admission: RE | Admit: 2012-01-29 | Discharge: 2012-01-29 | Disposition: A | Discharge status: Home / Self Care | Payer: BC Managed Care – PPO | Source: Ambulatory Visit | Attending: Internal Medicine | Admitting: Internal Medicine

## 2012-01-29 DIAGNOSIS — R7989 Other specified abnormal findings of blood chemistry: Secondary | ICD-10-CM

## 2012-02-02 NOTE — Progress Notes (Signed)
Patient ID: Kari Welch, female   DOB: 1945/04/02, 66 y.o.   MRN: [id number] reexcision of mole- dysplastic nevus Sterile technique 1%lidocaine infomred consent  Sutures out in 7-10 days

## 2012-02-16 ENCOUNTER — Other Ambulatory Visit: Payer: Self-pay | Admitting: Internal Medicine

## 2012-02-19 ENCOUNTER — Telehealth: Payer: Self-pay | Admitting: Internal Medicine

## 2012-02-19 MED ORDER — FLUOXETINE HCL 40 MG PO CAPS: 40.0000 mg | ORAL_CAPSULE | Freq: Every day | ORAL | Status: DC

## 2012-02-19 NOTE — Telephone Encounter (Signed)
Pt called and said that Dr Donalee Citrin at [facility] has transferred to another state. Dr Donalee Citrin used to fill her FLUoxetine (PROZAC) 40 MG capsule. Pt is unable to get this med filled until she see Dr Pandora Leiter  Feb 26th 2014. Pt is req Dr Cato Mulligan to write the script for FLUoxetine (PROZAC) 40 MG capsule 90 day supply to [facility] in [place], to last until pts appt on February 26. Pls call pt when this has been done.

## 2012-02-19 NOTE — Telephone Encounter (Signed)
Ok per Dr Cato Mulligan, rx sent in electronically, pt aware

## 2012-02-24 ENCOUNTER — Ambulatory Visit
Admission: RE | Admit: 2012-02-24 | Discharge: 2012-02-24 | Disposition: A | Discharge status: Home / Self Care | Payer: BC Managed Care – PPO | Source: Ambulatory Visit | Attending: Obstetrics and Gynecology | Admitting: Obstetrics and Gynecology

## 2012-02-24 DIAGNOSIS — Z1231 Encounter for screening mammogram for malignant neoplasm of breast: Secondary | ICD-10-CM

## 2012-03-16 ENCOUNTER — Other Ambulatory Visit: Payer: Self-pay | Admitting: *Deleted

## 2012-03-16 MED ORDER — FLUOXETINE HCL 40 MG PO CAPS: 40.0000 mg | ORAL_CAPSULE | Freq: Every day | ORAL | Status: DC

## 2012-05-26 DIAGNOSIS — G64 Other disorders of peripheral nervous system: Secondary | ICD-10-CM | POA: Insufficient documentation

## 2012-05-26 DIAGNOSIS — I739 Peripheral vascular disease, unspecified: Secondary | ICD-10-CM | POA: Insufficient documentation

## 2012-05-26 DIAGNOSIS — I7300 Raynaud's syndrome without gangrene: Secondary | ICD-10-CM | POA: Insufficient documentation

## 2012-05-26 DIAGNOSIS — D6859 Other primary thrombophilia: Secondary | ICD-10-CM | POA: Insufficient documentation

## 2012-05-26 DIAGNOSIS — D6852 Prothrombin gene mutation: Secondary | ICD-10-CM | POA: Insufficient documentation

## 2012-05-26 DIAGNOSIS — I872 Venous insufficiency (chronic) (peripheral): Secondary | ICD-10-CM | POA: Insufficient documentation

## 2012-05-26 DIAGNOSIS — I10 Essential (primary) hypertension: Secondary | ICD-10-CM | POA: Insufficient documentation

## 2012-06-18 ENCOUNTER — Other Ambulatory Visit: Payer: Self-pay | Admitting: Internal Medicine

## 2012-06-21 ENCOUNTER — Other Ambulatory Visit: Payer: Self-pay | Admitting: Internal Medicine

## 2012-06-23 ENCOUNTER — Telehealth: Payer: Self-pay | Admitting: Internal Medicine

## 2012-06-23 NOTE — Telephone Encounter (Signed)
PT called and stated that her HYDROcodone-acetaminophen (NORCO/VICODIN) 5-325 MG per tablet was NOT received by the pharmacy. We sent her Xanax prescription over and it was received and filled. She wants to refrain from picking it up, until her Hydrocodone is prepared as well. Please assist.

## 2012-06-23 NOTE — Telephone Encounter (Signed)
This was called in this morning, pt aware

## 2012-08-17 ENCOUNTER — Other Ambulatory Visit: Payer: Self-pay | Admitting: Neurology

## 2012-08-17 ENCOUNTER — Telehealth: Payer: Self-pay | Admitting: Internal Medicine

## 2012-08-17 ENCOUNTER — Ambulatory Visit
Admission: RE | Admit: 2012-08-17 | Discharge: 2012-08-17 | Disposition: A | Discharge status: Home / Self Care | Payer: BC Managed Care – PPO | Source: Ambulatory Visit | Attending: Internal Medicine | Admitting: Internal Medicine

## 2012-08-17 DIAGNOSIS — I776 Arteritis, unspecified: Secondary | ICD-10-CM

## 2012-08-17 NOTE — Telephone Encounter (Signed)
Patient Information:  Caller Name: Kari Welch  Phone: [phone]  Patient: Kari Welch, Kari Welch  Gender: Female  DOB: 03-11-1946  Age: 67 Years  PCP: Kari Welch (Adults only)  Office Follow Up:  Does the office need to follow up with this patient?: Yes  Instructions For The Office: Please check with MD and call [name] to have appointment scheduled. Needs order/referral to [facility] for [place] of L leg.  RN Note:  Requesting appointment to have nodule in leg checked and possible [place]. Hx Vasculitis - and seen at [facility] but wanting checked locally. Uses [facility].  Symptoms  Reason For Call & Symptoms: Nodule in lower L leg since 08/08/12 that feels tender with light pressure Twinge or pull in leg as she starts walking. Looks bluish in that area. Yesterday had slight pain/ pinching feeling in back of knee on that leg. Felt chilled yesterday, but no fever.   Reviewed Health History In EMR: Yes  Reviewed Medications In EMR: Yes  Reviewed Allergies In EMR: No  Reviewed Surgeries / Procedures: Yes  Date of Onset of Symptoms: 08/08/2012  Treatments Tried: Resting more frequently.  Treatments Tried Worked: No  Guideline(s) Used:  Leg Pain  Disposition Per Guideline:   Go to ED Now (or to Office with PCP Approval)  Reason For Disposition Reached:   Thigh or calf pain in only one leg and present > 1 hour  Advice Given:  Call Back If:  Moderate pain (e.g., limping) lasts more than 3 days  Signs of infection occur (e.g., spreading redness, warmth, fever)  You become worse.  Call Back If:   Calf swelling or constant calf pain occur  Signs of infection occur (e.g., spreading redness, warmth, fever)  You become worse.  Varicose Veins - Treatment  Walking is good for your blood flow (Reason: helps pump the blood out of the veins).  Avoid prolonged standing in one place.  Patient Refused Recommendation:  Patient Will Make Own Appointment  Requesting appointment before or after US  imaging of L leg

## 2012-08-17 NOTE — Addendum Note (Signed)
Addended by: Alfred Levins D on: 08/17/2012 11:57 AM   Modules accepted: Orders

## 2012-08-17 NOTE — Addendum Note (Signed)
Addended by: Alfred Levins D on: 08/17/2012 01:27 PM   Modules accepted: Orders

## 2012-08-17 NOTE — Addendum Note (Signed)
Addended by: Alfred Levins D on: 08/17/2012 12:21 PM   Modules accepted: Orders

## 2012-08-17 NOTE — Telephone Encounter (Signed)
Ok per Dr Cato Mulligan for hx of vasculitis, r/o clot.  Pt aware, referral order placed

## 2012-08-17 NOTE — Telephone Encounter (Signed)
Dr Clarisse Gouge called back and wanted pt have bilateral instead of unilateral left.  Order cancelled and changed to bilateral

## 2012-08-18 ENCOUNTER — Other Ambulatory Visit (HOSPITAL_COMMUNITY): Payer: Self-pay | Admitting: Interventional Radiology

## 2012-08-18 DIAGNOSIS — I83813 Varicose veins of bilateral lower extremities with pain: Secondary | ICD-10-CM

## 2012-08-19 ENCOUNTER — Other Ambulatory Visit: Payer: Self-pay | Admitting: Internal Medicine

## 2012-09-08 ENCOUNTER — Ambulatory Visit
Admission: RE | Admit: 2012-09-08 | Discharge: 2012-09-08 | Disposition: A | Discharge status: Home / Self Care | Payer: BC Managed Care – PPO | Source: Ambulatory Visit | Attending: Interventional Radiology | Admitting: Interventional Radiology

## 2012-09-08 VITALS — BP 112/56 | HR 84 | Resp 16 | Ht 61.5 in | Wt 130.0 lb

## 2012-09-08 DIAGNOSIS — I83813 Varicose veins of bilateral lower extremities with pain: Secondary | ICD-10-CM

## 2012-09-15 ENCOUNTER — Encounter: Payer: Self-pay | Admitting: Internal Medicine

## 2012-09-20 ENCOUNTER — Other Ambulatory Visit: Payer: Self-pay | Admitting: Internal Medicine

## 2012-09-21 ENCOUNTER — Telehealth: Payer: Self-pay | Admitting: Emergency Medicine

## 2012-09-21 NOTE — Telephone Encounter (Signed)
CALLED PT TO MAKE HER AWARE THAT [employer] IS REQUIRING HER TO DO OF CONSERVATIVE TX W/ COMPRESSION STOCKINGS AND WE WILL SCHEDULE HER BACK HER FOR A F/U APPT.

## 2012-09-27 ENCOUNTER — Encounter: Payer: Self-pay | Admitting: Internal Medicine

## 2012-10-05 ENCOUNTER — Telehealth: Payer: Self-pay | Admitting: Emergency Medicine

## 2012-10-05 ENCOUNTER — Other Ambulatory Visit (HOSPITAL_COMMUNITY): Payer: Self-pay | Admitting: Interventional Radiology

## 2012-10-05 DIAGNOSIS — I83812 Varicose veins of left lower extremities with pain: Secondary | ICD-10-CM

## 2012-10-05 NOTE — Telephone Encounter (Signed)
CALLED PT TO MAKE HER AWARE THAT [employer] APPROVED HER EVLT.  SHE WOULD LIKE TO SET UP IN AUG.  WILL CALL HER WHEN SCHEDULE IS AVA.

## 2012-10-12 ENCOUNTER — Encounter: Payer: Self-pay | Admitting: Internal Medicine

## 2012-10-18 ENCOUNTER — Telehealth: Payer: Self-pay | Admitting: *Deleted

## 2012-10-18 ENCOUNTER — Encounter: Payer: Self-pay | Admitting: Internal Medicine

## 2012-10-18 ENCOUNTER — Ambulatory Visit (AMBULATORY_SURGERY_CENTER): Payer: BC Managed Care – PPO | Admitting: *Deleted

## 2012-10-18 VITALS — Ht 61.0 in | Wt 131.0 lb

## 2012-10-18 DIAGNOSIS — Z86010 Personal history of colonic polyps: Secondary | ICD-10-CM

## 2012-10-18 MED ORDER — MOVIPREP 100 G PO SOLR: ORAL | Status: DC

## 2012-10-18 NOTE — Telephone Encounter (Signed)
Dr. Juanda Chance, This pt is on Pletal for PVD.  Do you want her to hold this for her procedure?  Thank you, Baxter Hire

## 2012-10-18 NOTE — Telephone Encounter (Signed)
No. Pletal does not have to be stopped prior to colonoscpy.

## 2012-10-18 NOTE — Progress Notes (Signed)
No egg or soy allergies 

## 2012-10-19 NOTE — Telephone Encounter (Signed)
Spoke with pt and told her that she doesn't need to stop her Pletal or ASA

## 2012-10-25 ENCOUNTER — Telehealth: Payer: Self-pay | Admitting: Emergency Medicine

## 2012-10-25 NOTE — Telephone Encounter (Signed)
Called pt to make her aware that Dr Archer Asa does want her to stop her Pletal 5d prior to her laser tx.  Told pt to stop on 11-12-12. Any other questions, please call.

## 2012-11-03 ENCOUNTER — Ambulatory Visit (AMBULATORY_SURGERY_CENTER): Payer: BC Managed Care – PPO | Admitting: Internal Medicine

## 2012-11-03 ENCOUNTER — Encounter: Payer: Self-pay | Admitting: Internal Medicine

## 2012-11-03 VITALS — BP 116/68 | HR 71 | Temp 98.5°F | Resp 16 | Ht 61.0 in | Wt 131.0 lb

## 2012-11-03 DIAGNOSIS — Z86010 Personal history of colonic polyps: Secondary | ICD-10-CM

## 2012-11-03 DIAGNOSIS — Z800 Family history of malignant neoplasm of digestive organs: Secondary | ICD-10-CM

## 2012-11-03 MED ORDER — SODIUM CHLORIDE 0.9 % IV SOLN: 500.0000 mL | INTRAVENOUS | Status: DC

## 2012-11-03 NOTE — Op Note (Signed)
[facility] [address] [place], [address]   COLONOSCOPY PROCEDURE REPORT  PATIENT: Kari Welch, Kari Welch  MR#: [id number] BIRTHDATE: 04-13-1945 , 66  yrs. old GENDER: Female ENDOSCOPIST: Hart Carwin, MD REFERRED XB:JYNWGN colonoscopy PROCEDURE DATE:  11/03/2012 PROCEDURE:   Colonoscopy, screening First Screening Colonoscopy - Avg.  risk and is 50 yrs.  old or older - No.  Prior Negative Screening - Now for repeat screening. Above average risk  History of Adenoma - Now for follow-up colonoscopy & has been > or = to 3 yrs.  N/A  Polyps Removed Today? No.  Recommend repeat exam, <10 yrs? Yes.  High risk (family or personal hx). ASA CLASS:   Class I INDICATIONS:Patient's immediate family history of colon cancer and mother with colon cancer, normal colonoscopy 09/2007. MEDICATIONS: MAC sedation, administered by CRNA and propofol (Diprivan) 200mg  IV  DESCRIPTION OF PROCEDURE:   After the risks benefits and alternatives of the procedure were thoroughly explained, informed consent was obtained.  A digital rectal exam revealed no abnormalities of the rectum.   The LB [id number]-[id number]  endoscope was introduced through the anus and advanced to the cecum, which was identified by both the appendix and ileocecal valve. No adverse events experienced.   The quality of the prep was excellent, using MoviPrep  The instrument was then slowly withdrawn as the colon was fully examined.      COLON FINDINGS: A normal appearing cecum, ileocecal valve, and appendiceal orifice were identified.  The ascending, hepatic flexure, transverse, splenic flexure, descending, sigmoid colon and rectum appeared unremarkable.  No polyps or cancers were seen. Retroflexed views revealed no abnormalities. The time to cecum=minutes 0 seconds.  Withdrawal time=8 minutes 0 seconds.  The scope was withdrawn and the procedure completed. COMPLICATIONS: There were no complications.  ENDOSCOPIC  IMPRESSION: Normal colon  RECOMMENDATIONS: High fiber diet recall 5 years   eSigned:  Hart Carwin, MD 11/03/2012 4:06 PM   cc:   PATIENT NAME:  Maycie, Luera MR#: [id number]

## 2012-11-03 NOTE — Progress Notes (Signed)
Patient did not experience any of the following events: a burn prior to discharge; a fall within the facility; wrong site/side/patient/procedure/implant event; or a hospital transfer or hospital admission upon discharge from the facility. (G8907) Patient did not have preoperative order for IV antibiotic SSI prophylaxis. (G8918)  

## 2012-11-03 NOTE — Patient Instructions (Addendum)
YOU HAD AN ENDOSCOPIC PROCEDURE TODAY AT THE [facility]: Refer to the procedure report that was given to you for any specific questions about what was found during the examination.  If the procedure report does not answer your questions, please call your gastroenterologist to clarify.  If you requested that your care partner not be given the details of your procedure findings, then the procedure report has been included in a sealed envelope for you to review at your convenience later.  YOU SHOULD EXPECT: Some feelings of bloating in the abdomen. Passage of more gas than usual.  Walking can help get rid of the air that was put into your GI tract during the procedure and reduce the bloating. If you had a lower endoscopy (such as a colonoscopy or flexible sigmoidoscopy) you may notice spotting of blood in your stool or on the toilet paper. If you underwent a bowel prep for your procedure, then you may not have a normal bowel movement for a few days.  DIET: Your first meal following the procedure should be a light meal and then it is ok to progress to your normal diet.  A half-sandwich or bowl of soup is an example of a good first meal.  Heavy or fried foods are harder to digest and may make you feel nauseous or bloated.  Likewise meals heavy in dairy and vegetables can cause extra gas to form and this can also increase the bloating.  Drink plenty of fluids but you should avoid alcoholic beverages for 24 hours.  ACTIVITY: Your care partner should take you home directly after the procedure.  You should plan to take it easy, moving slowly for the rest of the day.  You can resume normal activity the day after the procedure however you should NOT DRIVE or use heavy machinery for 24 hours (because of the sedation medicines used during the test).    SYMPTOMS TO REPORT IMMEDIATELY: A gastroenterologist can be reached at any hour.  During normal business hours, 8:30 AM to 5:00 PM Monday through Friday,  call [phone].  After hours and on weekends, please call the GI answering service at [phone] who will take a message and have the physician on call contact you.   Following lower endoscopy (colonoscopy or flexible sigmoidoscopy):  Excessive amounts of blood in the stool  Significant tenderness or worsening of abdominal pains  Swelling of the abdomen that is new, acute  Fever of 100F or higher    FOLLOW UP: If any biopsies were taken you will be contacted by phone or by letter within the next 1-3 weeks.  Call your gastroenterologist if you have not heard about the biopsies in 3 weeks.  Our staff will call the home number listed on your records the next business day following your procedure to check on you and address any questions or concerns that you may have at that time regarding the information given to you following your procedure. This is a courtesy call and so if there is no answer at the home number and we have not heard from you through the emergency physician on call, we will assume that you have returned to your regular daily activities without incident.  SIGNATURES/CONFIDENTIALITY: You and/or your care partner have signed paperwork which will be entered into your electronic medical record.  These signatures attest to the fact that that the information above on your After Visit Summary has been reviewed and is understood.  Full responsibility of the confidentiality   of this discharge information lies with you and/or your care-partner.     

## 2012-11-05 ENCOUNTER — Telehealth: Payer: Self-pay

## 2012-11-05 NOTE — Telephone Encounter (Signed)
No answer, left message to call if questions or concerns following procedure 11/03/2012

## 2012-11-17 ENCOUNTER — Ambulatory Visit
Admission: RE | Admit: 2012-11-17 | Discharge: 2012-11-17 | Disposition: A | Discharge status: Home / Self Care | Payer: BC Managed Care – PPO | Source: Ambulatory Visit | Attending: Interventional Radiology | Admitting: Interventional Radiology

## 2012-11-17 VITALS — BP 111/76 | HR 74 | Temp 97.5°F | Resp 14

## 2012-11-17 DIAGNOSIS — I83812 Varicose veins of left lower extremities with pain: Secondary | ICD-10-CM

## 2012-11-17 MED ORDER — DIAZEPAM 10 MG PO TABS: 10.0000 mg | ORAL_TABLET | Freq: Once | ORAL | Status: DC

## 2012-11-17 MED ORDER — DIAZEPAM 5 MG PO TABS: 5.0000 mg | ORAL_TABLET | Freq: Once | ORAL | Status: DC

## 2012-11-17 NOTE — Progress Notes (Signed)
1610  Informed consent obtained.  Given verbal & written discharge instructions.  Patient states that she understands.    0945  22 gauge x 1" insyte catheter started Left antecubital (x 1 attempt) with saline lock & 7" extension.  Flushed w/ 10 mL NSS without difficulty.  Site unremarkable.  1045 Procedure started.  1235  Procedure completed.  Patient wearing graduated compression pantyhose, 20-30 mm Hg.   1245 IV discontinued, catheter intact, site unremarkable.    1250  Reviewed discharge instructions with patient and spouse.  1255 Ambulated to car.  Spouse to drive.    Teneil Shiller Carmell [place], RN 11/17/2012 1:23 PM

## 2012-11-24 ENCOUNTER — Ambulatory Visit
Admission: RE | Admit: 2012-11-24 | Discharge: 2012-11-24 | Disposition: A | Discharge status: Home / Self Care | Payer: BC Managed Care – PPO | Source: Ambulatory Visit | Attending: Interventional Radiology | Admitting: Interventional Radiology

## 2012-11-24 ENCOUNTER — Other Ambulatory Visit (HOSPITAL_COMMUNITY): Payer: Self-pay | Admitting: Interventional Radiology

## 2012-11-24 DIAGNOSIS — I83812 Varicose veins of left lower extremities with pain: Secondary | ICD-10-CM

## 2012-11-30 ENCOUNTER — Telehealth: Payer: Self-pay | Admitting: Emergency Medicine

## 2012-11-30 NOTE — Telephone Encounter (Signed)
Called pt to r/s her 25mo f/u EVLT of LLE as requested per pt.   Pt also stated that the IBU has helped the red spot on her LLE/ankle area is much improved.  Still tender, but not red.  She will call if needed prior to her next appt. On 01-13-13.

## 2012-12-01 ENCOUNTER — Encounter: Payer: BC Managed Care – PPO | Admitting: Internal Medicine

## 2012-12-13 ENCOUNTER — Encounter: Payer: Self-pay | Admitting: Obstetrics and Gynecology

## 2012-12-22 ENCOUNTER — Other Ambulatory Visit: Payer: BC Managed Care – PPO

## 2012-12-22 ENCOUNTER — Ambulatory Visit: Payer: BC Managed Care – PPO

## 2012-12-24 ENCOUNTER — Other Ambulatory Visit: Payer: Self-pay | Admitting: *Deleted

## 2012-12-24 MED ORDER — POTASSIUM CHLORIDE CRYS ER 20 MEQ PO TBCR: 20.0000 meq | EXTENDED_RELEASE_TABLET | Freq: Every day | ORAL | Status: DC

## 2012-12-27 ENCOUNTER — Other Ambulatory Visit: Payer: Self-pay | Admitting: *Deleted

## 2012-12-27 MED ORDER — FLUOXETINE HCL 40 MG PO CAPS: 40.0000 mg | ORAL_CAPSULE | Freq: Every day | ORAL | Status: DC

## 2012-12-29 ENCOUNTER — Telehealth: Payer: Self-pay | Admitting: Internal Medicine

## 2012-12-29 ENCOUNTER — Other Ambulatory Visit: Payer: Self-pay | Admitting: *Deleted

## 2012-12-29 MED ORDER — HYDROCODONE-ACETAMINOPHEN 5-325 MG PO TABS: ORAL_TABLET | ORAL | Status: DC

## 2012-12-29 NOTE — Telephone Encounter (Signed)
FYI Pt will be switching to  [employer]

## 2012-12-29 NOTE — Telephone Encounter (Signed)
Chart is already updated

## 2013-01-07 ENCOUNTER — Ambulatory Visit (INDEPENDENT_AMBULATORY_CARE_PROVIDER_SITE_OTHER): Payer: Medicare Other

## 2013-01-07 DIAGNOSIS — Z23 Encounter for immunization: Secondary | ICD-10-CM

## 2013-01-13 ENCOUNTER — Ambulatory Visit
Admission: RE | Admit: 2013-01-13 | Discharge: 2013-01-13 | Disposition: A | Discharge status: Home / Self Care | Payer: Medicare Other | Source: Ambulatory Visit | Attending: Interventional Radiology | Admitting: Interventional Radiology

## 2013-01-13 DIAGNOSIS — I83812 Varicose veins of left lower extremities with pain: Secondary | ICD-10-CM

## 2013-01-20 ENCOUNTER — Telehealth: Payer: Self-pay | Admitting: Radiology

## 2013-01-20 ENCOUNTER — Telehealth: Payer: Self-pay | Admitting: Emergency Medicine

## 2013-01-20 NOTE — Telephone Encounter (Signed)
Pt returned our call to ck status of her left lower ankle sclero injections.  Pt states that everything is going well, no complications!

## 2013-01-20 NOTE — Telephone Encounter (Signed)
Left message requesting patient call with s/p sclerotherapy update.  Daphnie Venturini Carmell [place], RN 01/20/2013 11:34 AM

## 2013-01-26 ENCOUNTER — Other Ambulatory Visit (HOSPITAL_COMMUNITY): Payer: Self-pay | Admitting: Interventional Radiology

## 2013-01-26 DIAGNOSIS — I83811 Varicose veins of right lower extremities with pain: Secondary | ICD-10-CM

## 2013-02-22 ENCOUNTER — Other Ambulatory Visit: Payer: Self-pay | Admitting: *Deleted

## 2013-02-22 MED ORDER — FLUOXETINE HCL 40 MG PO CAPS: 40.0000 mg | ORAL_CAPSULE | Freq: Every day | ORAL | Status: DC

## 2013-03-02 ENCOUNTER — Ambulatory Visit
Admission: RE | Admit: 2013-03-02 | Discharge: 2013-03-02 | Disposition: A | Discharge status: Home / Self Care | Payer: Medicare Other | Source: Ambulatory Visit | Attending: Interventional Radiology | Admitting: Interventional Radiology

## 2013-03-02 VITALS — BP 109/71 | HR 70 | Temp 97.6°F | Resp 14

## 2013-03-02 DIAGNOSIS — I83811 Varicose veins of right lower extremities with pain: Secondary | ICD-10-CM

## 2013-03-02 MED ORDER — DIAZEPAM 5 MG PO TABS
10.0000 mg | ORAL_TABLET | Freq: Once | ORAL | Status: AC
  Administered 2013-03-02: 10 mg via ORAL

## 2013-03-02 MED ORDER — DIAZEPAM 5 MG PO TABS
5.0000 mg | ORAL_TABLET | Freq: Once | ORAL | Status: AC
  Administered 2013-03-02: 5 mg via ORAL

## 2013-03-03 ENCOUNTER — Other Ambulatory Visit: Payer: Medicare Other

## 2013-03-03 ENCOUNTER — Ambulatory Visit: Payer: Medicare Other

## 2013-03-09 ENCOUNTER — Ambulatory Visit
Admission: RE | Admit: 2013-03-09 | Discharge: 2013-03-09 | Disposition: A | Discharge status: Home / Self Care | Payer: Medicare Other | Source: Ambulatory Visit | Attending: Interventional Radiology | Admitting: Interventional Radiology

## 2013-03-09 DIAGNOSIS — I83811 Varicose veins of right lower extremities with pain: Secondary | ICD-10-CM

## 2013-03-11 ENCOUNTER — Encounter: Payer: Self-pay | Admitting: *Deleted

## 2013-03-14 ENCOUNTER — Other Ambulatory Visit (INDEPENDENT_AMBULATORY_CARE_PROVIDER_SITE_OTHER): Payer: Medicare Other

## 2013-03-14 DIAGNOSIS — E785 Hyperlipidemia, unspecified: Secondary | ICD-10-CM

## 2013-03-14 DIAGNOSIS — D735 Infarction of spleen: Secondary | ICD-10-CM

## 2013-03-14 DIAGNOSIS — D7389 Other diseases of spleen: Secondary | ICD-10-CM

## 2013-03-14 LAB — HEPATIC FUNCTION PANEL
ALT: 49 U/L — ABNORMAL HIGH (ref 0–35)
AST: 34 U/L (ref 0–37)
Alkaline Phosphatase: 65 U/L (ref 39–117)
Bilirubin, Direct: 0.1 mg/dL (ref 0.0–0.3)
Total Bilirubin: 0.7 mg/dL (ref 0.3–1.2)

## 2013-03-15 ENCOUNTER — Telehealth: Payer: Self-pay | Admitting: *Deleted

## 2013-03-15 NOTE — Telephone Encounter (Signed)
Received a call from [place] with Dr. Clarisse Gouge. Dr. Clarisse Gouge is asking for Dr. Juanda Chance to call him about his wife's labs from yesterday.

## 2013-03-16 ENCOUNTER — Other Ambulatory Visit (HOSPITAL_COMMUNITY): Payer: Self-pay | Admitting: Interventional Radiology

## 2013-03-16 ENCOUNTER — Ambulatory Visit: Payer: Self-pay | Admitting: Obstetrics and Gynecology

## 2013-03-16 ENCOUNTER — Telehealth: Payer: Self-pay | Admitting: Internal Medicine

## 2013-03-16 DIAGNOSIS — I83811 Varicose veins of right lower extremities with pain: Secondary | ICD-10-CM

## 2013-03-16 DIAGNOSIS — I83813 Varicose veins of bilateral lower extremities with pain: Secondary | ICD-10-CM

## 2013-03-16 NOTE — Telephone Encounter (Signed)
[facility] samples up front for p/u, pt aware

## 2013-03-16 NOTE — Telephone Encounter (Signed)
I have spoken to Dr. Vela Prose personally about his wife's abnormal liver function tests. She will make an appointment to see me and we are planning to obtain serum ceruloplasmin, alpha-1 antitrypsin, IgG IgM, antimitochondrial antibody and ferritin levels she will need upper abdominal ultrasound every 2 years. Last one was in October 2013

## 2013-03-17 ENCOUNTER — Ambulatory Visit: Payer: Self-pay | Admitting: Obstetrics and Gynecology

## 2013-03-18 ENCOUNTER — Other Ambulatory Visit: Payer: Self-pay

## 2013-03-18 DIAGNOSIS — Z1231 Encounter for screening mammogram for malignant neoplasm of breast: Secondary | ICD-10-CM

## 2013-03-23 ENCOUNTER — Other Ambulatory Visit: Payer: Self-pay | Admitting: *Deleted

## 2013-03-23 MED ORDER — ESTRADIOL 0.05 MG/24HR TD PTTW: 1.0000 | MEDICATED_PATCH | TRANSDERMAL | Status: DC

## 2013-03-23 NOTE — Telephone Encounter (Signed)
Faxed refill request received from pharmacy for [facility] Last filled by MD on 03/02/12 X 1 YEAR Last AEX - 03/02/12  Next AEX - 04/04/12 Last MMG - 02/24/12  Patient would like to have 3 month supply for insurance reasons.  Pt has mammogram scheduled 03/29/13.  Please advise if OK.

## 2013-03-29 ENCOUNTER — Ambulatory Visit
Admission: RE | Admit: 2013-03-29 | Discharge: 2013-03-29 | Disposition: A | Discharge status: Home / Self Care | Payer: Medicare Other | Source: Ambulatory Visit

## 2013-03-29 DIAGNOSIS — Z1231 Encounter for screening mammogram for malignant neoplasm of breast: Secondary | ICD-10-CM

## 2013-03-29 LAB — HM MAMMOGRAPHY

## 2013-03-31 LAB — HM PAP SMEAR

## 2013-03-31 LAB — HM MAMMOGRAPHY

## 2013-04-04 ENCOUNTER — Ambulatory Visit (INDEPENDENT_AMBULATORY_CARE_PROVIDER_SITE_OTHER): Payer: Medicare Other | Admitting: Obstetrics and Gynecology

## 2013-04-04 ENCOUNTER — Encounter: Payer: Self-pay | Admitting: Obstetrics and Gynecology

## 2013-04-04 VITALS — BP 102/60 | HR 80 | Resp 16 | Ht 61.25 in | Wt 130.2 lb

## 2013-04-04 DIAGNOSIS — Z780 Asymptomatic menopausal state: Secondary | ICD-10-CM

## 2013-04-04 DIAGNOSIS — N951 Menopausal and female climacteric states: Secondary | ICD-10-CM

## 2013-04-04 DIAGNOSIS — Z01419 Encounter for gynecological examination (general) (routine) without abnormal findings: Secondary | ICD-10-CM

## 2013-04-04 DIAGNOSIS — M810 Age-related osteoporosis without current pathological fracture: Secondary | ICD-10-CM

## 2013-04-04 NOTE — Progress Notes (Signed)
GYNECOLOGY VISIT  PCP: Dr Leanne Chang  Referring provider:   HPI: 68 y.o.   Married  Caucasian  female   G0P0 with Patient's last menstrual period was 03/31/1977.   here for  AEX. A couple of concerns today. Lumps on labial folds, more on the right side.  No pain or drainage but are larger than last year.  Minivelle not covered by Medicare. Has hot flashes when is off from the patch for a couple of days Wants to continue with ERT due to hot flashes.   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 History of ulceration of fingertips. History of phlebitis.  Has had saphenous vein surgery.  Had a skin reaction to Climara patch. Off Vivelle last year and switched to [place] 0.5.  Hgb: PCP  Urine:  PCP  GYNECOLOGIC HISTORY: Patient's last menstrual period was 03/31/1977. Sexually active: yes  Partner preference: female Contraception:  TAH  Menopausal hormone therapy: Minivelle 0.05mg  DES exposure:   no Blood transfusions: no   Sexually transmitted diseases:  no  GYN Procedures:  TAH-infertility work up prior to surgery, D&C, statu post LSO Mammogram:  03/29/13 normal               Pap: 06/18/00 normal   History of abnormal pap smear:  yes   OB History   Grav Para Term Preterm Abortions TAB SAB Ect Mult Living   0              Obstetric Comments   3 adopted children       LIFESTYLE: Exercise:   walking            Tobacco: former smoker Alcohol:occ glass of wine Drug use: none   OTHER HEALTH MAINTENANCE: Tetanus/TDap: 2014 Gardisil: no Influenza:  2014 Zostavax: 2013  Bone density:2009 Colonoscopy:2013 repeat in 5 years  Cholesterol check: 12/14 with PCP  Family History  Problem Relation Age of Onset  . Stroke Mother   . Cancer Mother     colon  . Colon cancer Mother   . Heart attack Mother   . Migraines Mother   . Stroke Father   . Heart attack Father   . Esophageal cancer Neg Hx   . Stomach cancer Neg Hx   . Rectal cancer Neg  Hx   . Diabetes Maternal Grandmother     Patient Active Problem List   Diagnosis Date Noted  . HYPERLIPIDEMIA 02/04/2010  . 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 INTERNAL HEMORRHOIDS 01/24/2009  . COLONIC POLYPS, HYPERPLASTIC, HX OF 01/24/2009  . VASCULITIS 11/29/2007  . HYPERTENSION, ESSENTIAL NOS 11/19/2006   Past Medical History  Diagnosis Date  . Hypertension   . Migraine   . Splenic infarction   . Vasculitis   . Hyperlipidemia   . Allergy     seasonal  . Arthritis   . Clotting disorder     superficial left leg- seeing a vein specialist in August  . PVD (peripheral vascular disease)     takes Pletal to open blood flow of blood to fingers, toes, and lower extremities  . Raynaud's disease     Past Surgical History  Procedure Laterality Date  . Dilation and curettage of uterus    . Mass excision      left hand  . Sympthectomy      left hand, 2008  . Colonoscopy    . Abdominal hysterectomy  1979    TAH/LSO  . Pelvic laparoscopy      d/t  infertility  . Cervix lesion destruction  1970  . Laser ablation      bilateral leg-left 8/14, right 12/14    ALLERGIES: Prednisone  Current Outpatient Prescriptions  Medication Sig Dispense Refill  . ALPRAZolam (XANAX) 0.5 MG tablet TAKE 1 TABLET BY MOUTH EVERY 8 HOURS AS NEEDED  30 tablet  4  . amLODipine (NORVASC) 5 MG tablet Take 1 tablet by mouth 2 (two) times daily.       Marland Kitchen aspirin (BABY ASPIRIN) 81 MG chewable tablet Chew 81 mg by mouth daily.        . cilostazol (PLETAL) 100 MG tablet Take 100 mg by mouth 2 (two) times daily.        Marland Kitchen estradiol (MINIVELLE) 0.05 MG/24HR patch Place 1 patch (0.05 mg total) onto the skin 2 (two) times a week.  24 patch  0  . FLUoxetine (PROZAC) 40 MG capsule Take 1 capsule (40 mg total) by mouth daily.  90 capsule  3  . HYDROcodone-acetaminophen (NORCO/VICODIN) 5-325 MG per tablet TAKE 1 TABLET BY MOUTH EVERY DAY AS NEEDED  30 tablet  3  . isosorbide mononitrate (IMDUR) 60 MG 24 hr tablet Take  60 mg by mouth daily.        . potassium chloride SA (K-DUR,KLOR-CON) 20 MEQ tablet Take 1 tablet (20 mEq total) by mouth daily.  90 tablet  3  . PROAIR HFA 108 (90 BASE) MCG/ACT inhaler       . rosuvastatin (CRESTOR) 20 MG tablet Take 10 mg by mouth every other day.      . triamterene-hydrochlorothiazide (MAXZIDE-25) 37.5-25 MG per tablet Take 1 tablet by mouth daily.      Marland Kitchen atorvastatin (LIPITOR) 40 MG tablet 40 mg.       No current facility-administered medications for this visit.     ROS:  Pertinent items are noted in HPI.  SOCIAL HISTORY:    Patient is a Brewing technologist. Husband is a neurologist.  PHYSICAL EXAMINATION:    LMP 03/31/1977   Wt Readings from Last 3 Encounters:  11/03/12 131 lb (59.421 kg)  10/18/12 131 lb (59.421 kg)  09/08/12 130 lb (58.968 kg)     Ht Readings from Last 3 Encounters:  11/03/12 5\' 1"  (1.549 m)  10/18/12 5\' 1"  (1.549 m)  09/08/12 5' 1.5" (1.562 m)    General appearance: alert, cooperative and appears stated age Head: Normocephalic, without obvious abnormality, atraumatic Neck: no adenopathy, supple, symmetrical, trachea midline and thyroid not enlarged, symmetric, no tenderness/mass/nodules Lungs: clear to auscultation bilaterally Breasts: Inspection negative, No nipple retraction or dimpling, No nipple discharge or bleeding, No axillary or supraclavicular adenopathy, Normal to palpation without dominant masses Heart: regular rate and rhythm Abdomen: soft, non-tender; no masses,  no organomegaly Extremities: extremities normal, atraumatic, no cyanosis or edema Skin: Skin color, texture, turgor normal. No rashes or lesions Lymph nodes: Cervical, supraclavicular, and axillary nodes normal. No abnormal inguinal nodes palpated Neurologic: Grossly normal  Pelvic: External genitalia:  no lesions.  Small right labia majora cysts - 4 mm and firm (colestiomas or sebaceous cysts)              Urethra:  normal appearing urethra with no masses, tenderness or  lesions              Bartholins and Skenes: normal                 Vagina: normal appearing vagina with normal color and discharge, no lesions  Cervix: normal appearance              Pap and high risk HPV testing done: no.            Bimanual Exam:  Uterus:  uterus is  absent                                      Adnexa: normal adnexa in size, nontender and no masses                                      Rectovaginal: Confirms                                      Anus:  normal sphincter tone, no lesions  ASSESSMENT  Normal gynecologic exam. History of phlebitis and vasculitis. Cardiology OK's use of ERT.  Benign vulvar cysts.  PLAN  Mammogram # D yearly. Pap smear and high risk HPV testing not indicated. Counseled on use and side effects of HRT - risk of stroke and DVT/PE, breast cancer discussed. When runs out of [place] will refill with generic patch of Vivelle 0.05 mg.  Patient will call in beginning of March when runs out.  May need prior authorization for ERT at that time.  Bone density ordered. Discussed Calcium and Vit D and weight bearing exercise. Return annually or prn   An After Visit Summary was printed and given to the patient.

## 2013-04-04 NOTE — Patient Instructions (Signed)

## 2013-04-11 ENCOUNTER — Telehealth: Payer: Self-pay | Admitting: Internal Medicine

## 2013-04-11 DIAGNOSIS — R7989 Other specified abnormal findings of blood chemistry: Secondary | ICD-10-CM

## 2013-04-11 DIAGNOSIS — R945 Abnormal results of liver function studies: Secondary | ICD-10-CM

## 2013-04-12 ENCOUNTER — Ambulatory Visit
Admission: RE | Admit: 2013-04-12 | Discharge: 2013-04-12 | Disposition: A | Discharge status: Home / Self Care | Payer: Medicare Other | Source: Ambulatory Visit | Attending: Interventional Radiology | Admitting: Interventional Radiology

## 2013-04-12 ENCOUNTER — Other Ambulatory Visit (INDEPENDENT_AMBULATORY_CARE_PROVIDER_SITE_OTHER): Payer: Medicare Other

## 2013-04-12 ENCOUNTER — Other Ambulatory Visit (HOSPITAL_COMMUNITY): Payer: Self-pay | Admitting: Interventional Radiology

## 2013-04-12 DIAGNOSIS — R945 Abnormal results of liver function studies: Secondary | ICD-10-CM

## 2013-04-12 DIAGNOSIS — I83811 Varicose veins of right lower extremities with pain: Secondary | ICD-10-CM

## 2013-04-12 DIAGNOSIS — I83813 Varicose veins of bilateral lower extremities with pain: Secondary | ICD-10-CM

## 2013-04-12 DIAGNOSIS — R7989 Other specified abnormal findings of blood chemistry: Secondary | ICD-10-CM

## 2013-04-12 LAB — FERRITIN: Ferritin: 152.3 ng/mL (ref 10.0–291.0)

## 2013-04-12 NOTE — Telephone Encounter (Signed)
Spoke with patient and scheduled labs and OV on 04/26/13 at 2:30 PM with Dr. Olevia Perches.

## 2013-04-13 ENCOUNTER — Ambulatory Visit: Payer: Medicare Other | Admitting: Internal Medicine

## 2013-04-13 ENCOUNTER — Telehealth: Payer: Self-pay | Admitting: Obstetrics and Gynecology

## 2013-04-13 DIAGNOSIS — Z13820 Encounter for screening for osteoporosis: Secondary | ICD-10-CM

## 2013-04-13 DIAGNOSIS — Z780 Asymptomatic menopausal state: Secondary | ICD-10-CM

## 2013-04-13 LAB — CERULOPLASMIN: Ceruloplasmin: 28 mg/dL (ref 20–60)

## 2013-04-13 LAB — IGM: IGM, SERUM: 95 mg/dL (ref 52–322)

## 2013-04-13 LAB — ALPHA-1-ANTITRYPSIN: A1 ANTITRYPSIN SER: 159 mg/dL (ref 90–200)

## 2013-04-13 LAB — IGG: IGG (IMMUNOGLOBIN G), SERUM: 1280 mg/dL (ref 690–1700)

## 2013-04-13 NOTE — Telephone Encounter (Signed)
My apologies for the confusion. It was supposed to say for osteoporosis screening and not osteoporosis. Menopause is also an appropriate diagnosis.

## 2013-04-13 NOTE — Telephone Encounter (Signed)
Kari Welch from the [facility] is calling for some clarification on a order for bone density Referral said it was for osteopetrosis  but patient said she doesn't have osteopetrosis when they call to schedule for bone density needs some clarification

## 2013-04-13 NOTE — Telephone Encounter (Signed)
Dr. Quincy Simmonds, okay to change order to screening for osteoporosis or menopausal?

## 2013-04-14 ENCOUNTER — Encounter: Payer: Self-pay | Admitting: *Deleted

## 2013-04-14 ENCOUNTER — Other Ambulatory Visit: Payer: Self-pay | Admitting: Obstetrics and Gynecology

## 2013-04-14 DIAGNOSIS — E2839 Other primary ovarian failure: Secondary | ICD-10-CM

## 2013-04-14 LAB — MITOCHONDRIAL ANTIBODIES: MITOCHONDRIAL M2 AB, IGG: 0.7 (ref <0.91)

## 2013-04-14 NOTE — Telephone Encounter (Signed)
Spoke with Noelia at [facility]. New order placed with dx screening for osteoporosis.

## 2013-04-19 ENCOUNTER — Encounter: Payer: Self-pay | Admitting: Internal Medicine

## 2013-04-19 ENCOUNTER — Ambulatory Visit (INDEPENDENT_AMBULATORY_CARE_PROVIDER_SITE_OTHER): Payer: Medicare Other | Admitting: Internal Medicine

## 2013-04-19 VITALS — BP 114/74 | HR 88 | Temp 98.2°F | Ht 61.5 in | Wt 129.0 lb

## 2013-04-19 DIAGNOSIS — Z23 Encounter for immunization: Secondary | ICD-10-CM

## 2013-04-19 DIAGNOSIS — I10 Essential (primary) hypertension: Secondary | ICD-10-CM

## 2013-04-19 DIAGNOSIS — Z0000 Encounter for general adult medical examination without abnormal findings: Secondary | ICD-10-CM

## 2013-04-19 DIAGNOSIS — E785 Hyperlipidemia, unspecified: Secondary | ICD-10-CM

## 2013-04-19 LAB — HEPATIC FUNCTION PANEL
ALBUMIN: 3.8 g/dL (ref 3.5–5.2)
ALT: 74 U/L — ABNORMAL HIGH (ref 0–35)
AST: 55 U/L — ABNORMAL HIGH (ref 0–37)
Alkaline Phosphatase: 56 U/L (ref 39–117)
BILIRUBIN TOTAL: 0.6 mg/dL (ref 0.3–1.2)
Bilirubin, Direct: 0 mg/dL (ref 0.0–0.3)
Total Protein: 7.9 g/dL (ref 6.0–8.3)

## 2013-04-19 LAB — BASIC METABOLIC PANEL
BUN: 15 mg/dL (ref 6–23)
CALCIUM: 9.6 mg/dL (ref 8.4–10.5)
CHLORIDE: 104 meq/L (ref 96–112)
CO2: 25 meq/L (ref 19–32)
Creatinine, Ser: 1.4 mg/dL — ABNORMAL HIGH (ref 0.4–1.2)
GFR: 41.1 mL/min — ABNORMAL LOW (ref >60.00)
GLUCOSE: 93 mg/dL (ref 70–99)
POTASSIUM: 3.9 meq/L (ref 3.5–5.1)
Sodium: 138 mEq/L (ref 135–145)

## 2013-04-19 LAB — CBC WITH DIFFERENTIAL/PLATELET
BASOS ABS: 0 10*3/uL (ref 0.0–0.1)
Basophils Relative: 0.6 % (ref 0.0–3.0)
EOS PCT: 1.8 % (ref 0.0–5.0)
Eosinophils Absolute: 0.1 10*3/uL (ref 0.0–0.7)
HEMATOCRIT: 43.2 % (ref 36.0–46.0)
Hemoglobin: 14.5 g/dL (ref 12.0–15.0)
LYMPHS PCT: 33.3 % (ref 12.0–46.0)
Lymphs Abs: 1.5 10*3/uL (ref 0.7–4.0)
MCHC: 33.5 g/dL (ref 30.0–36.0)
MCV: 105.9 fl — ABNORMAL HIGH (ref 78.0–100.0)
Monocytes Absolute: 0.5 10*3/uL (ref 0.1–1.0)
Monocytes Relative: 10.6 % (ref 3.0–12.0)
NEUTROS PCT: 53.7 % (ref 43.0–77.0)
Neutro Abs: 2.4 10*3/uL (ref 1.4–7.7)
PLATELETS: 360 10*3/uL (ref 150.0–400.0)
RBC: 4.07 Mil/uL (ref 3.87–5.11)
RDW: 13.7 % (ref 11.5–14.6)
WBC: 4.5 10*3/uL (ref 4.5–10.5)

## 2013-04-19 LAB — TSH: TSH: 4.02 u[IU]/mL (ref 0.35–5.50)

## 2013-04-19 LAB — LIPID PANEL
CHOL/HDL RATIO: 2
Cholesterol: 187 mg/dL (ref 0–200)
HDL: 78.4 mg/dL (ref >39.00)
LDL Cholesterol: 93 mg/dL (ref 0–99)
TRIGLYCERIDES: 80 mg/dL (ref 0.0–149.0)
VLDL: 16 mg/dL (ref 0.0–40.0)

## 2013-04-19 MED ORDER — HYDROCODONE-ACETAMINOPHEN 5-325 MG PO TABS: ORAL_TABLET | ORAL | Status: DC

## 2013-04-19 MED ORDER — ALPRAZOLAM 0.5 MG PO TABS: ORAL_TABLET | ORAL | Status: DC

## 2013-04-19 NOTE — Progress Notes (Signed)
Pre visit review using our clinic review tool, if applicable. No additional management support is needed unless otherwise documented below in the visit note. 

## 2013-04-19 NOTE — Progress Notes (Signed)
cpx  Past Medical History  Diagnosis Date  . Hypertension   . Migraine   . Splenic infarction   . Vasculitis   . Hyperlipidemia   . Allergy     seasonal  . Arthritis   . Clotting disorder     superficial left leg- seeing a vein specialist in August  . PVD (peripheral vascular disease)     takes Pletal to open blood flow of blood to fingers, toes, and lower extremities  . Raynaud's disease   . Abnormal Pap smear     h/o  . Collagen vascular disease   . Internal hemorrhoids   . Hyperplastic colon polyp     History   Social History  . Marital Status: Married    Spouse Name: N/A    Number of Children: N/A  . Years of Education: N/A   Occupational History  . Not on file.   Social History Main Topics  . Smoking status: Former Research scientist (life sciences)  . Smokeless tobacco: Never Used  . Alcohol Use: 0.0 oz/week     Comment: occ glass of wine  . Drug Use: No  . Sexual Activity: Yes    Partners: Male    Birth Control/ Protection: Surgical     Comment: TAH   Other Topics Concern  . Not on file   Social History Narrative   Married to Dr Catalina Gravel    Past Surgical History  Procedure Laterality Date  . Dilation and curettage of uterus    . Mass excision Left     hand  . Sympthectomy Left 2008    hand  . Colonoscopy    . Abdominal hysterectomy  1979    TAH/LSO  . Pelvic laparoscopy      d/t infertility  . Cervix lesion destruction  1970  . Laser ablation Bilateral     leg-left 8/14, right 12/14  . Right bartholin's gland cyst excision Right     1990s    Family History  Problem Relation Age of Onset  . Stroke Mother   . Colon cancer Mother   . Heart attack Mother   . Migraines Mother   . Stroke Father   . Heart attack Father   . Esophageal cancer Neg Hx   . Stomach cancer Neg Hx   . Rectal cancer Neg Hx   . Diabetes Maternal Grandmother     Allergies  Allergen Reactions  . Prednisone Swelling    short course=swelling    Current Outpatient Prescriptions on File  Prior to Visit  Medication Sig Dispense Refill  . ALPRAZolam (XANAX) 0.5 MG tablet TAKE 1 TABLET BY MOUTH EVERY 8 HOURS AS NEEDED  30 tablet  4  . amLODipine (NORVASC) 5 MG tablet Take 1 tablet by mouth 2 (two) times daily.       Marland Kitchen aspirin (BABY ASPIRIN) 81 MG chewable tablet Chew 81 mg by mouth daily.        . cilostazol (PLETAL) 100 MG tablet Take 100 mg by mouth 2 (two) times daily.        Marland Kitchen estradiol (MINIVELLE) 0.05 MG/24HR patch Place 1 patch (0.05 mg total) onto the skin 2 (two) times a week.  24 patch  0  . FLUoxetine (PROZAC) 40 MG capsule Take 1 capsule (40 mg total) by mouth daily.  90 capsule  3  . HYDROcodone-acetaminophen (NORCO/VICODIN) 5-325 MG per tablet TAKE 1 TABLET BY MOUTH EVERY DAY AS NEEDED  30 tablet  3  . isosorbide mononitrate (IMDUR) 60 MG  24 hr tablet Take 60 mg by mouth daily.        . potassium chloride SA (K-DUR,KLOR-CON) 20 MEQ tablet Take 1 tablet (20 mEq total) by mouth daily.  90 tablet  3  . PROAIR HFA 108 (90 BASE) MCG/ACT inhaler       . rosuvastatin (CRESTOR) 20 MG tablet Take 10 mg by mouth every other day.      . triamterene-hydrochlorothiazide (MAXZIDE-25) 37.5-25 MG per tablet Take 1 tablet by mouth daily.       No current facility-administered medications on file prior to visit.     patient denies chest pain, shortness of breath, orthopnea. Denies lower extremity edema, abdominal pain, change in appetite, change in bowel movements. Patient denies rashes, musculoskeletal complaints. No other specific complaints in a complete review of systems.   BP 114/74  Pulse 88  Temp(Src) 98.2 F (36.8 C) (Oral)  Ht 5' 1.5" (1.562 m)  Wt 129 lb (58.514 kg)  BMI 23.98 kg/m2  LMP 03/31/1977  Well-developed well-nourished female in no acute distress. HEENT exam atraumatic, normocephalic, extraocular muscles are intact. Neck is supple. No jugular venous distention no thyromegaly. Chest clear to auscultation without increased work of breathing. Cardiac exam S1 and  S2 are regular. Abdominal exam active bowel sounds, soft, nontender. Extremities no edema. Neurologic exam she is alert without any motor sensory deficits. Gait is normal.  Well visit- health maint utd

## 2013-04-20 ENCOUNTER — Ambulatory Visit
Admission: RE | Admit: 2013-04-20 | Discharge: 2013-04-20 | Disposition: A | Discharge status: Home / Self Care | Payer: Medicare Other | Source: Ambulatory Visit | Attending: Interventional Radiology | Admitting: Interventional Radiology

## 2013-04-20 DIAGNOSIS — I83811 Varicose veins of right lower extremities with pain: Secondary | ICD-10-CM

## 2013-04-26 ENCOUNTER — Ambulatory Visit (INDEPENDENT_AMBULATORY_CARE_PROVIDER_SITE_OTHER): Payer: Medicare Other | Admitting: Internal Medicine

## 2013-04-26 ENCOUNTER — Encounter: Payer: Self-pay | Admitting: Internal Medicine

## 2013-04-26 ENCOUNTER — Other Ambulatory Visit (INDEPENDENT_AMBULATORY_CARE_PROVIDER_SITE_OTHER): Payer: Medicare Other

## 2013-04-26 VITALS — BP 110/68 | HR 75 | Ht 61.25 in | Wt 131.8 lb

## 2013-04-26 DIAGNOSIS — R7989 Other specified abnormal findings of blood chemistry: Secondary | ICD-10-CM

## 2013-04-26 DIAGNOSIS — R945 Abnormal results of liver function studies: Secondary | ICD-10-CM

## 2013-04-26 DIAGNOSIS — R109 Unspecified abdominal pain: Secondary | ICD-10-CM

## 2013-04-26 LAB — VITAMIN B12: VITAMIN B 12: 387 pg/mL (ref 211–911)

## 2013-04-26 LAB — AMYLASE: AMYLASE: 105 U/L (ref 27–131)

## 2013-04-26 LAB — LIPASE: Lipase: 40 U/L (ref 11.0–59.0)

## 2013-04-26 LAB — PROTIME-INR
INR: 1 ratio (ref 0.8–1.0)
Prothrombin Time: 10.6 s (ref 10.2–12.4)

## 2013-04-26 NOTE — Progress Notes (Signed)
Kari Welch [birth year] [id number]  Note: This dictation was prepared with Dragon digital system. Any transcriptional errors that result from this procedure are unintentional.   History of Present Illness:  This is a 68 year old white female, wife of Dr.Stogner, a local neurologist, who comes to discuss abnormal liver function tests and an episode of abdominal pain which occurred approximately 4 weeks ago and started rather suddenly in the epigastrium in a bandlike fashion around her rib cage to the back. It lasted at least an hour. She has a history of pancreatitis which was evaluated in 2010. The etiology was not clear. Her ultrasound of the gallbladder was negative and she has no hx of alcohol.excess. She has been treated for vasculitis or possibly collagen vascular disease at [facility] and has been on Pletal 100 mg twice a day for Raynaud syndrome. Her liver function tests showed mild elevation of transaminases as follows: in 2011, AST 60, ALT 84, in November 2011, AST 54, ALT 79, in November 2012, AST 49, ALT 81, in October 2013, AST 82, ALT 117, in December 2014, AST 34, ALT 49 and most recently 2 weeks ago, the LFTs were about the same. She had extensive blood tests to look for the etiology of the liver disease, but all parameters were negative including AMA titer, ceruloplasmin, IgG, IgM, ferritin, alpha 1 antitrypsin, and hepatitis B and C serologies. Her INR was 9.8. She has a borderline low white cell count around 4,000-5,000 and MCV of 107 in the presence of normal B12 levels. She has never had a liver biopsy. Since the most recent episode of severe epigastric pain, she has not had any recurrence. Her screening colonoscopy in August 2014 because of family history of colon cancer in her mother was normal.    Past Medical History  Diagnosis Date  . Hypertension   . Migraine   . Splenic infarction   . Vasculitis   . Hyperlipidemia   . Allergy     seasonal  . Arthritis   . Clotting disorder      superficial left leg- seeing a vein specialist in August  . PVD (peripheral vascular disease)     takes Pletal to open blood flow of blood to fingers, toes, and lower extremities  . Raynaud's disease   . Abnormal Pap smear     h/o  . Collagen vascular disease   . Internal hemorrhoids   . Hyperplastic colon polyp   . Elevated LFTs     Past Surgical History  Procedure Laterality Date  . Dilation and curettage of uterus    . Mass excision Left     hand  . Sympthectomy Left 2008    hand  . Colonoscopy    . Abdominal hysterectomy  1979    TAH/LSO  . Pelvic laparoscopy      d/t infertility  . Cervix lesion destruction  1970  . Laser ablation Bilateral     leg-left 8/14, right 12/14  . Right bartholin's gland cyst excision Right     1990s  . Ablation saphenous vein w/ rfa      Allergies  Allergen Reactions  . Prednisone Swelling    short course=swelling    Family history and social history have been reviewed.  Review of Systems: Denies heartburn. Occasional indigestion  The remainder of the 10 point ROS is negative except as outlined in the H&P  Physical Exam: General Appearance Well developed, in no distress Eyes  Non icteric  HEENT  Non traumatic, normocephalic  Mouth No lesion, tongue papillated, no cheilosis Neck Supple without adenopathy, thyroid not enlarged, no carotid bruits, no JVD Lungs Clear to auscultation bilaterally COR Normal S1, normal S2, regular rhythm, no murmur, quiet precordium Abdomen Soft tender on the right costal margin. Normal active bowel sounds. No rebound. Rectal Not done  Extremities  No pedal edema Skin No lesions Neurological Alert and oriented x 3 ,No asterixis  Psychological Normal mood and affect  Assessment and Plan:   Problem #1 Chronic mild elevation of transaminases which over the years have been attributed to statins and cholesterol lowering agents. However, these have not changed in the event these agents were  discontinued. Her liver synthetic function seems to be normal and she has a normal albumin and INR. She is having attacks of abdominal pain and has had at least one episode of pancreatitis which raises the possibility of biliary origin. There is a family history of gallbladder disease in her father who actually died at age 77 due to septic gallbladder. We will proceed with HIDA scan with CCK to rule out choledocholithiasis. We will also recheck her B12, folate level, amylase, lipase, anti DNA antibody and ANA titer. I will consider a percutaneous liver biopsy at some point. Family hx of colon cancer in a direct relative- up to date on colonoscopy  Macrocytosis in the setting of normal B12 levels. Consider myeloproliferative disease. We need to follow her WBC as well and consider heme consult in the future.  Problem #2 Colorectal screening. Patient has a positive family history of colon cancer in her mother. She is up-to-date on her colonoscopy at this time.  Problem #3 Abdominal pain. We need to rule out choledocholithiasis. There are no symptoms to suggest gastroesophageal reflux.    Delfin Edis 04/26/2013

## 2013-04-26 NOTE — Patient Instructions (Addendum)
Your physician has requested that you go to the basement for the following lab work before leaving today: B12, Folate, ANA, Anti DNA, PT, Amylase, Lipase  You have been scheduled for a HIDA scan at [facility] (1st floor) on 05-02-13. Please arrive 15 minutes prior to your scheduled appointment at  1:82 am. Make certain not to have anything to eat or drink at least 6 hours prior to your test. We also ask that you hold your hydrocodone 8 hours prior to your appointment. Should this appointment date or time not work well for you, please call radiology scheduling at [phone].  _____________________________________________________________________ hepatobiliary (HIDA) scan is an imaging procedure used to diagnose problems in the liver, gallbladder and bile ducts. In the HIDA scan, a radioactive chemical or tracer is injected into a vein in your arm. The tracer is handled by the liver like bile. Bile is a fluid produced and excreted by your liver that helps your digestive system break down fats in the foods you eat. Bile is stored in your gallbladder and the gallbladder releases the bile when you eat a meal. A special nuclear medicine scanner (gamma camera) tracks the flow of the tracer from your liver into your gallbladder and small intestine.  During your HIDA scan  You'll be asked to change into a hospital gown before your HIDA scan begins. Your health care team will position you on a table, usually on your back. The radioactive tracer is then injected into a vein in your arm.The tracer travels through your bloodstream to your liver, where it's taken up by the bile-producing cells. The radioactive tracer travels with the bile from your liver into your gallbladder and through your bile ducts to your small intestine.You may feel some pressure while the radioactive tracer is injected into your vein. As you lie on the table, a special gamma camera is positioned over your abdomen taking pictures of the  tracer as it moves through your body. The gamma camera takes pictures continually for about an hour. You'll need to keep still during the HIDA scan. This can become uncomfortable, but you may find that you can lessen the discomfort by taking deep breaths and thinking about other things. Tell your health care team if you're uncomfortable. The radiologist will watch on a computer the progress of the radioactive tracer through your body. The HIDA scan may be stopped when the radioactive tracer is seen in the gallbladder and enters your small intestine. This typically takes about an hour. In some cases extra imaging will be performed if original images aren't satisfactory, if morphine is given to help visualize the gallbladder or if the medication CCK is given to look at the contraction of the gallbladder. This test typically takes 2 hours to complete. ________________________________________________________________________

## 2013-04-27 ENCOUNTER — Other Ambulatory Visit (INDEPENDENT_AMBULATORY_CARE_PROVIDER_SITE_OTHER): Payer: Medicare Other

## 2013-04-27 ENCOUNTER — Other Ambulatory Visit: Payer: Medicare Other

## 2013-04-27 ENCOUNTER — Ambulatory Visit
Admission: RE | Admit: 2013-04-27 | Discharge: 2013-04-27 | Disposition: A | Discharge status: Home / Self Care | Payer: Self-pay | Source: Ambulatory Visit | Attending: Obstetrics and Gynecology | Admitting: Obstetrics and Gynecology

## 2013-04-27 DIAGNOSIS — Z13820 Encounter for screening for osteoporosis: Secondary | ICD-10-CM

## 2013-04-27 DIAGNOSIS — Z780 Asymptomatic menopausal state: Secondary | ICD-10-CM

## 2013-04-27 DIAGNOSIS — R7989 Other specified abnormal findings of blood chemistry: Secondary | ICD-10-CM

## 2013-04-27 DIAGNOSIS — R945 Abnormal results of liver function studies: Secondary | ICD-10-CM

## 2013-04-27 DIAGNOSIS — R109 Unspecified abdominal pain: Secondary | ICD-10-CM

## 2013-04-27 LAB — ANA: ANA: NEGATIVE

## 2013-04-27 LAB — FOLATE: Folate: 15.5 ng/mL (ref >5.9)

## 2013-04-27 LAB — ANTI-DNA ANTIBODY, DOUBLE-STRANDED: ds DNA Ab: <1 IU/mL

## 2013-05-02 ENCOUNTER — Ambulatory Visit (HOSPITAL_COMMUNITY)
Admission: RE | Admit: 2013-05-02 | Discharge: 2013-05-02 | Disposition: A | Discharge status: Home / Self Care | Payer: Medicare Other | Source: Ambulatory Visit | Attending: Internal Medicine | Admitting: Internal Medicine

## 2013-05-02 DIAGNOSIS — R945 Abnormal results of liver function studies: Secondary | ICD-10-CM

## 2013-05-02 DIAGNOSIS — R110 Nausea: Secondary | ICD-10-CM | POA: Insufficient documentation

## 2013-05-02 DIAGNOSIS — R7989 Other specified abnormal findings of blood chemistry: Secondary | ICD-10-CM

## 2013-05-02 DIAGNOSIS — R109 Unspecified abdominal pain: Secondary | ICD-10-CM

## 2013-05-02 MED ORDER — SINCALIDE 5 MCG IJ SOLR: 1.2000 ug | Freq: Once | INTRAMUSCULAR | Status: DC

## 2013-05-02 MED ORDER — TECHNETIUM TC 99M MEBROFENIN IV KIT
5.5000 | PACK | Freq: Once | INTRAVENOUS | Status: AC | PRN
  Administered 2013-05-02: 6 via INTRAVENOUS

## 2013-05-04 ENCOUNTER — Telehealth: Payer: Self-pay | Admitting: Internal Medicine

## 2013-05-04 NOTE — Telephone Encounter (Signed)
Relevant patient education assigned to patient using Emmi. ° °

## 2013-05-19 ENCOUNTER — Encounter: Payer: Self-pay | Admitting: Internal Medicine

## 2013-06-02 ENCOUNTER — Telehealth: Payer: Self-pay | Admitting: Internal Medicine

## 2013-06-02 NOTE — Telephone Encounter (Signed)
Pt request refill HYDROcodone-acetaminophen (NORCO/VICODIN) 5-325 MG per tablet °

## 2013-06-03 MED ORDER — HYDROCODONE-ACETAMINOPHEN 5-325 MG PO TABS: ORAL_TABLET | ORAL | Status: DC

## 2013-06-03 NOTE — Telephone Encounter (Signed)
Ok per [employer], rx up front for p/u, pt aware

## 2013-06-27 ENCOUNTER — Telehealth: Payer: Self-pay | Admitting: Internal Medicine

## 2013-06-27 NOTE — Telephone Encounter (Signed)
Pt has sinus infection and would like a rx of amoxillicin 545 mg one capsule three times a day for 14 day. [place]. Pt decline appt

## 2013-06-28 NOTE — Telephone Encounter (Signed)
Pt decline appt. Pt stated she is turning the corner

## 2013-06-28 NOTE — Telephone Encounter (Signed)
Unlikely that she really has an infection See Abby Potash

## 2013-07-04 ENCOUNTER — Other Ambulatory Visit (INDEPENDENT_AMBULATORY_CARE_PROVIDER_SITE_OTHER): Payer: Medicare Other

## 2013-07-04 DIAGNOSIS — I10 Essential (primary) hypertension: Secondary | ICD-10-CM

## 2013-07-04 LAB — HEPATIC FUNCTION PANEL
ALBUMIN: 3.7 g/dL (ref 3.5–5.2)
ALT: 57 U/L — ABNORMAL HIGH (ref 0–35)
AST: 48 U/L — ABNORMAL HIGH (ref 0–37)
Alkaline Phosphatase: 48 U/L (ref 39–117)
BILIRUBIN TOTAL: 0.5 mg/dL (ref 0.3–1.2)
Bilirubin, Direct: 0 mg/dL (ref 0.0–0.3)
Total Protein: 7.4 g/dL (ref 6.0–8.3)

## 2013-07-06 ENCOUNTER — Telehealth: Payer: Self-pay | Admitting: Obstetrics and Gynecology

## 2013-07-06 ENCOUNTER — Other Ambulatory Visit: Payer: Self-pay | Admitting: Obstetrics and Gynecology

## 2013-07-06 MED ORDER — ESTRADIOL 0.05 MG/24HR TD PTTW: 1.0000 | MEDICATED_PATCH | TRANSDERMAL | Status: DC

## 2013-07-06 NOTE — Telephone Encounter (Signed)
Spoke with patient. Message from [place] given as seen below. Patient agreeable and verbalizes understanding.  Routing to provider for final review. Patient agreeable to disposition. Will close encounter

## 2013-07-06 NOTE — Telephone Encounter (Signed)
Dr. Quincy Simmonds, patient requested change to Generic. Your note from AEX 1/15 suggested that she may want to change based on cost, but I attempted to place order and it suggested Vivelle Patch still may not be covered. Should we try to enter with Vivelle and then send prior authorization?

## 2013-07-06 NOTE — Telephone Encounter (Signed)
I think it is a good idea to do the Vivelle and then ask for authorization if necessary.  I will try to put in an order.  Patient had a skin reaction to [place] patch.

## 2013-07-06 NOTE — Telephone Encounter (Signed)
Patient requests Minivelle generic, if it doesn't come in generic, she wants to try something else.   Prior authorization for [facility] requested by patient. BCBS [facility] Medicare PPO [place]

## 2013-07-11 ENCOUNTER — Telehealth: Payer: Self-pay | Admitting: Emergency Medicine

## 2013-07-11 NOTE — Telephone Encounter (Signed)
Ally from [facility] calling to advise that Estradiol is approved for one year, effective today. They will notify patient via letter as well.

## 2013-07-19 ENCOUNTER — Telehealth: Payer: Self-pay | Admitting: Internal Medicine

## 2013-07-19 MED ORDER — HYDROCODONE-ACETAMINOPHEN 5-325 MG PO TABS: ORAL_TABLET | ORAL | Status: DC

## 2013-07-19 NOTE — Telephone Encounter (Signed)
Ok per Dr Leanne Chang, rx will be ready for p/u on 07/20/13, pt aware

## 2013-07-19 NOTE — Telephone Encounter (Signed)
Pt needs new rx hydrocodone °

## 2013-07-20 ENCOUNTER — Telehealth: Payer: Self-pay | Admitting: Obstetrics and Gynecology

## 2013-07-20 NOTE — Telephone Encounter (Signed)
Patient is calling about a prior authorization for estradiol (VIVELLE-DOT) 0.05 MG/24HR patch  Place 1 patch (0.05 mg total) onto the skin 2 (two) times a week., Starting 07/06/2013, Until Discontinued, Normal, Last Dose: Not Recorded  Refills: 8 ordered Pharmacy: [facility], [place].

## 2013-07-20 NOTE — Telephone Encounter (Signed)
Patient notified we have received approval through 07/12/14. Advised to call [facility] to advise she was going to come pick up rx.   Routing to provider for final review. Patient agreeable to disposition. Will close encounter

## 2013-08-29 ENCOUNTER — Telehealth: Payer: Self-pay | Admitting: Internal Medicine

## 2013-08-29 NOTE — Telephone Encounter (Signed)
Pt is needing new rx for HYDROcodone-acetaminophen (NORCO/VICODIN) 5-325 MG per tablet, please call when available for pick up. ° °

## 2013-08-31 MED ORDER — HYDROCODONE-ACETAMINOPHEN 5-325 MG PO TABS: ORAL_TABLET | ORAL | Status: DC

## 2013-08-31 NOTE — Addendum Note (Signed)
Addended by: Townsend Roger D on: 08/31/2013 05:37 PM   Modules accepted: Orders

## 2013-08-31 NOTE — Telephone Encounter (Signed)
Will you sign for this?

## 2013-08-31 NOTE — Telephone Encounter (Signed)
yes

## 2013-08-31 NOTE — Telephone Encounter (Signed)
rx up front for p/u, pt aware

## 2013-08-31 NOTE — Telephone Encounter (Signed)
rx was lost in office, Dr Shawna Orleans signed for it.

## 2013-09-21 ENCOUNTER — Other Ambulatory Visit (HOSPITAL_COMMUNITY): Payer: Self-pay | Admitting: Interventional Radiology

## 2013-09-21 DIAGNOSIS — I83813 Varicose veins of bilateral lower extremities with pain: Secondary | ICD-10-CM

## 2013-10-10 ENCOUNTER — Encounter (HOSPITAL_BASED_OUTPATIENT_CLINIC_OR_DEPARTMENT_OTHER): Payer: Self-pay | Admitting: *Deleted

## 2013-10-10 NOTE — Progress Notes (Signed)
To come in for [id number]  

## 2013-10-11 ENCOUNTER — Encounter (HOSPITAL_BASED_OUTPATIENT_CLINIC_OR_DEPARTMENT_OTHER)
Admission: RE | Admit: 2013-10-11 | Discharge: 2013-10-11 | Disposition: A | Discharge status: Home / Self Care | Payer: Medicare Other | Source: Ambulatory Visit | Attending: Orthopedic Surgery | Admitting: Orthopedic Surgery

## 2013-10-11 DIAGNOSIS — Q66219 Congenital metatarsus primus varus, unspecified foot: Secondary | ICD-10-CM | POA: Diagnosis not present

## 2013-10-11 DIAGNOSIS — I739 Peripheral vascular disease, unspecified: Secondary | ICD-10-CM | POA: Diagnosis not present

## 2013-10-11 DIAGNOSIS — M2010 Hallux valgus (acquired), unspecified foot: Secondary | ICD-10-CM | POA: Diagnosis present

## 2013-10-11 DIAGNOSIS — E785 Hyperlipidemia, unspecified: Secondary | ICD-10-CM | POA: Diagnosis not present

## 2013-10-11 DIAGNOSIS — Z79899 Other long term (current) drug therapy: Secondary | ICD-10-CM | POA: Diagnosis not present

## 2013-10-11 DIAGNOSIS — Z87891 Personal history of nicotine dependence: Secondary | ICD-10-CM | POA: Diagnosis not present

## 2013-10-11 DIAGNOSIS — M2040 Other hammer toe(s) (acquired), unspecified foot: Secondary | ICD-10-CM | POA: Diagnosis not present

## 2013-10-11 DIAGNOSIS — I10 Essential (primary) hypertension: Secondary | ICD-10-CM | POA: Diagnosis not present

## 2013-10-11 DIAGNOSIS — Z7982 Long term (current) use of aspirin: Secondary | ICD-10-CM | POA: Diagnosis not present

## 2013-10-11 LAB — BASIC METABOLIC PANEL
Anion gap: 14 (ref 5–15)
BUN: 16 mg/dL (ref 6–23)
CALCIUM: 9.2 mg/dL (ref 8.4–10.5)
CO2: 27 mEq/L (ref 19–32)
Chloride: 98 mEq/L (ref 96–112)
Creatinine, Ser: 1.09 mg/dL (ref 0.50–1.10)
GFR, EST AFRICAN AMERICAN: 60 mL/min — AB (ref >90)
GFR, EST NON AFRICAN AMERICAN: 52 mL/min — AB (ref >90)
Glucose, Bld: 75 mg/dL (ref 70–99)
Potassium: 4.2 mEq/L (ref 3.7–5.3)
SODIUM: 139 meq/L (ref 137–147)

## 2013-10-12 ENCOUNTER — Other Ambulatory Visit: Payer: Self-pay | Admitting: Orthopedic Surgery

## 2013-10-12 DIAGNOSIS — M2010 Hallux valgus (acquired), unspecified foot: Secondary | ICD-10-CM | POA: Diagnosis not present

## 2013-10-13 ENCOUNTER — Ambulatory Visit (HOSPITAL_BASED_OUTPATIENT_CLINIC_OR_DEPARTMENT_OTHER): Payer: Medicare Other | Admitting: Anesthesiology

## 2013-10-13 ENCOUNTER — Encounter (HOSPITAL_BASED_OUTPATIENT_CLINIC_OR_DEPARTMENT_OTHER)
Admission: RE | Disposition: A | Discharge status: Home / Self Care | Payer: Self-pay | Source: Ambulatory Visit | Attending: Orthopedic Surgery

## 2013-10-13 ENCOUNTER — Encounter (HOSPITAL_BASED_OUTPATIENT_CLINIC_OR_DEPARTMENT_OTHER): Payer: Medicare Other | Admitting: Anesthesiology

## 2013-10-13 ENCOUNTER — Ambulatory Visit (HOSPITAL_BASED_OUTPATIENT_CLINIC_OR_DEPARTMENT_OTHER)
Admission: RE | Admit: 2013-10-13 | Discharge: 2013-10-13 | Disposition: A | Discharge status: Home / Self Care | Payer: Medicare Other | Source: Ambulatory Visit | Attending: Orthopedic Surgery | Admitting: Orthopedic Surgery

## 2013-10-13 ENCOUNTER — Encounter (HOSPITAL_BASED_OUTPATIENT_CLINIC_OR_DEPARTMENT_OTHER): Payer: Self-pay | Admitting: *Deleted

## 2013-10-13 DIAGNOSIS — M2010 Hallux valgus (acquired), unspecified foot: Secondary | ICD-10-CM | POA: Insufficient documentation

## 2013-10-13 DIAGNOSIS — Z79899 Other long term (current) drug therapy: Secondary | ICD-10-CM | POA: Insufficient documentation

## 2013-10-13 DIAGNOSIS — M2040 Other hammer toe(s) (acquired), unspecified foot: Secondary | ICD-10-CM | POA: Insufficient documentation

## 2013-10-13 DIAGNOSIS — I10 Essential (primary) hypertension: Secondary | ICD-10-CM | POA: Insufficient documentation

## 2013-10-13 DIAGNOSIS — E785 Hyperlipidemia, unspecified: Secondary | ICD-10-CM | POA: Insufficient documentation

## 2013-10-13 DIAGNOSIS — Z87891 Personal history of nicotine dependence: Secondary | ICD-10-CM | POA: Insufficient documentation

## 2013-10-13 DIAGNOSIS — M2012 Hallux valgus (acquired), left foot: Secondary | ICD-10-CM

## 2013-10-13 DIAGNOSIS — I739 Peripheral vascular disease, unspecified: Secondary | ICD-10-CM | POA: Insufficient documentation

## 2013-10-13 DIAGNOSIS — Q66219 Congenital metatarsus primus varus, unspecified foot: Secondary | ICD-10-CM | POA: Insufficient documentation

## 2013-10-13 DIAGNOSIS — Z7982 Long term (current) use of aspirin: Secondary | ICD-10-CM | POA: Insufficient documentation

## 2013-10-13 HISTORY — PX: BUNIONECTOMY WITH HAMMERTOE RECONSTRUCTION: SHX5600

## 2013-10-13 LAB — POCT HEMOGLOBIN-HEMACUE: Hemoglobin: 15.3 g/dL — ABNORMAL HIGH (ref 12.0–15.0)

## 2013-10-13 SURGERY — BUNIONECTOMY, WITH HAMMER TOE CORRECTION
Anesthesia: General | Site: Foot | Laterality: Left

## 2013-10-13 MED ORDER — CEFAZOLIN SODIUM-DEXTROSE 2-3 GM-% IV SOLR
2.0000 g | INTRAVENOUS | Status: AC
  Administered 2013-10-13: 2 g via INTRAVENOUS

## 2013-10-13 MED ORDER — ACETAMINOPHEN 500 MG PO TABS
ORAL_TABLET | ORAL | Status: AC
  Filled 2013-10-13: qty 2

## 2013-10-13 MED ORDER — OXYCODONE HCL 5 MG/5ML PO SOLN: 5.0000 mg | Freq: Once | ORAL | Status: DC | PRN

## 2013-10-13 MED ORDER — MIDAZOLAM HCL 2 MG/2ML IJ SOLN
1.0000 mg | INTRAMUSCULAR | Status: DC | PRN
  Administered 2013-10-13: 2 mg via INTRAVENOUS

## 2013-10-13 MED ORDER — 0.9 % SODIUM CHLORIDE (POUR BTL) OPTIME
TOPICAL | Status: DC | PRN
  Administered 2013-10-13: 200 mL

## 2013-10-13 MED ORDER — FENTANYL CITRATE 0.05 MG/ML IJ SOLN
INTRAMUSCULAR | Status: AC
  Filled 2013-10-13: qty 6

## 2013-10-13 MED ORDER — PROPOFOL 10 MG/ML IV BOLUS
INTRAVENOUS | Status: DC | PRN
  Administered 2013-10-13: 150 mg via INTRAVENOUS

## 2013-10-13 MED ORDER — CEFAZOLIN SODIUM-DEXTROSE 2-3 GM-% IV SOLR
INTRAVENOUS | Status: AC
  Filled 2013-10-13: qty 50

## 2013-10-13 MED ORDER — HYDROMORPHONE HCL PF 1 MG/ML IJ SOLN: 0.2500 mg | INTRAMUSCULAR | Status: DC | PRN

## 2013-10-13 MED ORDER — FENTANYL CITRATE 0.05 MG/ML IJ SOLN
50.0000 ug | INTRAMUSCULAR | Status: DC | PRN
  Administered 2013-10-13: 50 ug via INTRAVENOUS

## 2013-10-13 MED ORDER — SODIUM CHLORIDE 0.9 % IV SOLN: INTRAVENOUS | Status: DC

## 2013-10-13 MED ORDER — BACITRACIN ZINC 500 UNIT/GM EX OINT
TOPICAL_OINTMENT | CUTANEOUS | Status: AC
  Filled 2013-10-13: qty 28.35

## 2013-10-13 MED ORDER — ACETAMINOPHEN 500 MG PO TABS
1000.0000 mg | ORAL_TABLET | Freq: Once | ORAL | Status: AC
  Administered 2013-10-13: 1000 mg via ORAL

## 2013-10-13 MED ORDER — LIDOCAINE HCL (CARDIAC) 20 MG/ML IV SOLN
INTRAVENOUS | Status: DC | PRN
  Administered 2013-10-13: 30 mg via INTRAVENOUS

## 2013-10-13 MED ORDER — FENTANYL CITRATE 0.05 MG/ML IJ SOLN
INTRAMUSCULAR | Status: AC
  Filled 2013-10-13: qty 2

## 2013-10-13 MED ORDER — BUPIVACAINE HCL (PF) 0.25 % IJ SOLN
INTRAMUSCULAR | Status: DC | PRN
  Administered 2013-10-13: 20 mL via PERINEURAL

## 2013-10-13 MED ORDER — PROPOFOL 10 MG/ML IV EMUL
INTRAVENOUS | Status: AC
  Filled 2013-10-13: qty 50

## 2013-10-13 MED ORDER — HYDROCODONE-ACETAMINOPHEN 10-325 MG PO TABS: 1.0000 | ORAL_TABLET | Freq: Four times a day (QID) | ORAL | Status: DC | PRN

## 2013-10-13 MED ORDER — BACITRACIN ZINC 500 UNIT/GM EX OINT
TOPICAL_OINTMENT | CUTANEOUS | Status: DC | PRN
  Administered 2013-10-13: 1 via TOPICAL

## 2013-10-13 MED ORDER — BUPIVACAINE-EPINEPHRINE (PF) 0.5% -1:200000 IJ SOLN
INTRAMUSCULAR | Status: AC
  Filled 2013-10-13: qty 30

## 2013-10-13 MED ORDER — OXYCODONE HCL 5 MG PO TABS: 5.0000 mg | ORAL_TABLET | Freq: Once | ORAL | Status: DC | PRN

## 2013-10-13 MED ORDER — MIDAZOLAM HCL 5 MG/5ML IJ SOLN
INTRAMUSCULAR | Status: DC | PRN
  Administered 2013-10-13: 1 mg via INTRAVENOUS

## 2013-10-13 MED ORDER — LACTATED RINGERS IV SOLN
INTRAVENOUS | Status: DC
  Administered 2013-10-13 (×2): via INTRAVENOUS

## 2013-10-13 MED ORDER — ONDANSETRON HCL 4 MG/2ML IJ SOLN
INTRAMUSCULAR | Status: DC | PRN
  Administered 2013-10-13: 4 mg via INTRAVENOUS

## 2013-10-13 MED ORDER — SUCCINYLCHOLINE CHLORIDE 20 MG/ML IJ SOLN
INTRAMUSCULAR | Status: AC
  Filled 2013-10-13: qty 1

## 2013-10-13 MED ORDER — MIDAZOLAM HCL 2 MG/2ML IJ SOLN
INTRAMUSCULAR | Status: AC
  Filled 2013-10-13: qty 2

## 2013-10-13 MED ORDER — METOCLOPRAMIDE HCL 5 MG/ML IJ SOLN: 10.0000 mg | Freq: Once | INTRAMUSCULAR | Status: DC | PRN

## 2013-10-13 MED ORDER — DEXAMETHASONE SODIUM PHOSPHATE 10 MG/ML IJ SOLN
INTRAMUSCULAR | Status: DC | PRN
  Administered 2013-10-13: 4 mg via INTRAVENOUS

## 2013-10-13 MED ORDER — BUPIVACAINE-EPINEPHRINE (PF) 0.5% -1:200000 IJ SOLN
INTRAMUSCULAR | Status: DC | PRN
  Administered 2013-10-13: 20 mL via PERINEURAL

## 2013-10-13 MED ORDER — CHLORHEXIDINE GLUCONATE 4 % EX LIQD: 60.0000 mL | Freq: Once | CUTANEOUS | Status: DC

## 2013-10-13 SURGICAL SUPPLY — 89 items
BANDAGE ESMARK 6X9 LF (GAUZE/BANDAGES/DRESSINGS) ×1 IMPLANT
BIT DRILL 1.7 CANN W/AO CONN (BIT) IMPLANT
BIT DRILL 1.7 LOW PROFILE (BIT) ×1 IMPLANT
BLADE AVERAGE 25X9 (BLADE) ×1 IMPLANT
BLADE MICRO SAGITTAL (BLADE) IMPLANT
BLADE MINI RND TIP GREEN BEAV (BLADE) ×1 IMPLANT
BLADE OSC/SAG .038X5.5 CUT EDG (BLADE) ×1 IMPLANT
BLADE SURG 15 STRL LF DISP TIS (BLADE) ×3 IMPLANT
BLADE SURG 15 STRL SS (BLADE) ×6
BNDG CMPR 9X4 STRL LF SNTH (GAUZE/BANDAGES/DRESSINGS)
BNDG CMPR 9X6 STRL LF SNTH (GAUZE/BANDAGES/DRESSINGS) ×1
BNDG COHESIVE 4X5 TAN STRL (GAUZE/BANDAGES/DRESSINGS) ×2 IMPLANT
BNDG COHESIVE 6X5 TAN STRL LF (GAUZE/BANDAGES/DRESSINGS) IMPLANT
BNDG CONFORM 2 STRL LF (GAUZE/BANDAGES/DRESSINGS) IMPLANT
BNDG CONFORM 3 STRL LF (GAUZE/BANDAGES/DRESSINGS) ×2 IMPLANT
BNDG ESMARK 4X9 LF (GAUZE/BANDAGES/DRESSINGS) IMPLANT
BNDG ESMARK 6X9 LF (GAUZE/BANDAGES/DRESSINGS) ×2
CHLORAPREP W/TINT 26ML (MISCELLANEOUS) ×2 IMPLANT
COVER TABLE BACK 60X90 (DRAPES) ×2 IMPLANT
CUFF TOURNIQUET SINGLE 18IN (TOURNIQUET CUFF) IMPLANT
CUFF TOURNIQUET SINGLE 24IN (TOURNIQUET CUFF) ×1 IMPLANT
CUFF TOURNIQUET SINGLE 34IN LL (TOURNIQUET CUFF) ×2 IMPLANT
DRAPE EXTREMITY T 121X128X90 (DRAPE) ×2 IMPLANT
DRAPE OEC MINIVIEW 54X84 (DRAPES) ×2 IMPLANT
DRAPE SURG 17X23 STRL (DRAPES) IMPLANT
DRAPE U-SHAPE 47X51 STRL (DRAPES) ×2 IMPLANT
DRSG EMULSION OIL 3X3 NADH (GAUZE/BANDAGES/DRESSINGS) ×2 IMPLANT
DRSG PAD ABDOMINAL 8X10 ST (GAUZE/BANDAGES/DRESSINGS) ×1 IMPLANT
ELECT REM PT RETURN 9FT ADLT (ELECTROSURGICAL) ×2
ELECTRODE REM PT RTRN 9FT ADLT (ELECTROSURGICAL) ×1 IMPLANT
GAUZE SPONGE 4X4 12PLY STRL (GAUZE/BANDAGES/DRESSINGS) ×2 IMPLANT
GAUZE SPONGE 4X4 16PLY XRAY LF (GAUZE/BANDAGES/DRESSINGS) IMPLANT
GLOVE BIO SURGEON STRL SZ 6.5 (GLOVE) ×1 IMPLANT
GLOVE BIO SURGEON STRL SZ8 (GLOVE) ×2 IMPLANT
GLOVE BIOGEL PI IND STRL 7.0 (GLOVE) IMPLANT
GLOVE BIOGEL PI IND STRL 8 (GLOVE) ×1 IMPLANT
GLOVE BIOGEL PI INDICATOR 7.0 (GLOVE) ×2
GLOVE BIOGEL PI INDICATOR 8 (GLOVE) ×1
GLOVE ECLIPSE 6.5 STRL STRAW (GLOVE) ×1 IMPLANT
GLOVE EXAM NITRILE MD LF STRL (GLOVE) ×1 IMPLANT
GOWN STRL REUS W/ TWL LRG LVL3 (GOWN DISPOSABLE) ×1 IMPLANT
GOWN STRL REUS W/ TWL XL LVL3 (GOWN DISPOSABLE) ×1 IMPLANT
GOWN STRL REUS W/TWL LRG LVL3 (GOWN DISPOSABLE) ×4
GOWN STRL REUS W/TWL XL LVL3 (GOWN DISPOSABLE) ×2
GUIDEWIRE .08 (WIRE) IMPLANT
K-WIRE .045X4 (WIRE) IMPLANT
K-WIRE DBL END .054 LG (WIRE) ×2 IMPLANT
NDL HYPO 25X1 1.5 SAFETY (NEEDLE) IMPLANT
NEEDLE HYPO 22GX1.5 SAFETY (NEEDLE) IMPLANT
NEEDLE HYPO 25X1 1.5 SAFETY (NEEDLE) IMPLANT
NS IRRIG 1000ML POUR BTL (IV SOLUTION) ×2 IMPLANT
PACK BASIN DAY SURGERY FS (CUSTOM PROCEDURE TRAY) ×2 IMPLANT
PAD CAST 4YDX4 CTTN HI CHSV (CAST SUPPLIES) ×1 IMPLANT
PADDING CAST ABS 4INX4YD NS (CAST SUPPLIES) ×1
PADDING CAST ABS COTTON 4X4 ST (CAST SUPPLIES) ×1 IMPLANT
PADDING CAST COTTON 4X4 STRL (CAST SUPPLIES) ×2
PADDING CAST COTTON 6X4 STRL (CAST SUPPLIES) IMPLANT
PENCIL BUTTON HOLSTER BLD 10FT (ELECTRODE) ×1 IMPLANT
SANITIZER HAND PURELL 535ML FO (MISCELLANEOUS) ×2 IMPLANT
SCREW CORTICAL 2.3X16 (Screw) ×2 IMPLANT
SCREW QUICK FIX 2X12 (Screw) ×1 IMPLANT
SHEET MEDIUM DRAPE 40X70 STRL (DRAPES) ×2 IMPLANT
SLEEVE SCD COMPRESS KNEE MED (MISCELLANEOUS) ×2 IMPLANT
SPLINT FAST PLASTER 5X30 (CAST SUPPLIES)
SPLINT PLASTER CAST FAST 5X30 (CAST SUPPLIES) IMPLANT
SPONGE LAP 18X18 X RAY DECT (DISPOSABLE) ×2 IMPLANT
STOCKINETTE 6  STRL (DRAPES) ×1
STOCKINETTE 6 STRL (DRAPES) ×1 IMPLANT
SUCTION FRAZIER TIP 10 FR DISP (SUCTIONS) IMPLANT
SUT ETHILON 3 0 FSL (SUTURE) IMPLANT
SUT ETHILON 3 0 PS 1 (SUTURE) ×2 IMPLANT
SUT ETHILON 4 0 PS 2 18 (SUTURE) IMPLANT
SUT MNCRL AB 3-0 PS2 18 (SUTURE) ×2 IMPLANT
SUT PROLENE 3 0 PS 1 (SUTURE) ×1 IMPLANT
SUT VIC AB 0 SH 27 (SUTURE) IMPLANT
SUT VIC AB 2-0 PS2 27 (SUTURE) IMPLANT
SUT VIC AB 2-0 SH 27 (SUTURE) ×2
SUT VIC AB 2-0 SH 27XBRD (SUTURE) IMPLANT
SUT VIC AB 3-0 PS1 18 (SUTURE)
SUT VIC AB 3-0 PS1 18XBRD (SUTURE) IMPLANT
SUT VICRYL 4-0 PS2 18IN ABS (SUTURE) IMPLANT
SYR BULB 3OZ (MISCELLANEOUS) ×2 IMPLANT
SYRINGE CONTROL L 12CC (SYRINGE) IMPLANT
SYRINGE CONTROL LL 12CC (SYRINGE) IMPLANT
TOWEL OR 17X24 6PK STRL BLUE (TOWEL DISPOSABLE) ×2 IMPLANT
TOWEL OR NON WOVEN STRL DISP B (DISPOSABLE) ×1 IMPLANT
TUBE CONNECTING 20X1/4 (TUBING) IMPLANT
UNDERPAD 30X30 INCONTINENT (UNDERPADS AND DIAPERS) ×2 IMPLANT
YANKAUER SUCT BULB TIP NO VENT (SUCTIONS) IMPLANT

## 2013-10-13 NOTE — Anesthesia Preprocedure Evaluation (Signed)
Anesthesia Evaluation  Patient identified by MRN, date of birth, ID band Patient awake    Reviewed: Allergy & Precautions, H&P , NPO status , Patient's Chart, lab work & pertinent test results, reviewed documented beta blocker date and time   Airway Mallampati: II TM Distance: >3 FB Neck ROM: full    Dental   Pulmonary neg pulmonary ROS, former smoker,  breath sounds clear to auscultation        Cardiovascular hypertension, On Medications + Peripheral Vascular Disease Rhythm:regular     Neuro/Psych  Headaches, negative psych ROS   GI/Hepatic negative GI ROS, Neg liver ROS,   Endo/Other  negative endocrine ROS  Renal/GU negative Renal ROS  negative genitourinary   Musculoskeletal   Abdominal   Peds  Hematology negative hematology ROS (+)   Anesthesia Other Findings See surgeon's H&P   Reproductive/Obstetrics negative OB ROS                           Anesthesia Physical Anesthesia Plan  ASA: III  Anesthesia Plan: General   Post-op Pain Management:    Induction: Intravenous  Airway Management Planned: LMA  Additional Equipment:   Intra-op Plan:   Post-operative Plan:   Informed Consent: I have reviewed the patients History and Physical, chart, labs and discussed the procedure including the risks, benefits and alternatives for the proposed anesthesia with the patient or authorized representative who has indicated his/her understanding and acceptance.   Dental Advisory Given  Plan Discussed with: CRNA and Surgeon  Anesthesia Plan Comments:         Anesthesia Quick Evaluation

## 2013-10-13 NOTE — Discharge Instructions (Signed)
Regional Anesthesia Blocks  1. Numbness or the inability to move the "blocked" extremity may last from 3-48 hours after placement. The length of time depends on the medication injected and your individual response to the medication. If the numbness is not going away after 48 hours, call your surgeon.  2. The extremity that is blocked will need to be protected until the numbness is gone and the  Strength has returned. Because you cannot feel it, you will need to take extra care to avoid injury. Because it may be weak, you may have difficulty moving it or using it. You may not know what position it is in without looking at it while the block is in effect.  3. For blocks in the legs and feet, returning to weight bearing and walking needs to be done carefully. You will need to wait until the numbness is entirely gone and the strength has returned. You should be able to move your leg and foot normally before you try and bear weight or walk. You will need someone to be with you when you first try to ensure you do not fall and possibly risk injury.  4. Bruising and tenderness at the needle site are common side effects and will resolve in a few days.  5. Persistent numbness or new problems with movement should be communicated to the surgeon or the [facility] [phone] [place] [phone]).  Post Anesthesia Home Care Instructions  Activity: Get plenty of rest for the remainder of the day. A responsible adult should stay with you for 24 hours following the procedure.  For the next 24 hours, DO NOT: -Drive a car -Paediatric nurse -Drink alcoholic beverages -Take any medication unless instructed by your physician -Make any legal decisions or sign important papers.  Meals: Start with liquid foods such as gelatin or soup. Progress to regular foods as tolerated. Avoid greasy, spicy, heavy foods. If nausea and/or vomiting occur, drink only clear liquids until the nausea  and/or vomiting subsides. Call your physician if vomiting continues.  Special Instructions/Symptoms: Your throat may feel dry or sore from the anesthesia or the breathing tube placed in your throat during surgery. If this causes discomfort, gargle with warm salt water. The discomfort should disappear within 24 hours.  Kari Simmer, MD [facility]  Please read the following information regarding your care after surgery.  Medications  You only need a prescription for the narcotic pain medicine (ex. oxycodone, Percocet, Norco).  All of the other medicines listed below are available over the counter. X norco as prescribed for pain.  Narcotic pain medicine (ex. oxycodone, Percocet, Vicodin) will cause constipation.  To prevent this problem, take the following medicines while you are taking any pain medicine. X docusate sodium (Colace) 100 mg twice a day X senna (Senokot) 2 tablets twice a day  Weight Bearing ? Bear weight when you are able on your operated leg or foot. X Bear weight only on the heel of your operated foot in the post-op shoe. ? Do not bear any weight on the operated leg or foot.  Cast / Splint / Dressing X Keep your splint or cast clean and dry.  Dont put anything (coat hanger, pencil, etc) down inside of it.  If it gets damp, use a hair dryer on the cool setting to dry it.  If it gets soaked, call the office to schedule an appointment for a cast change. ? Remove your dressing 3 days after surgery and cover  the incisions with dry dressings.    After your dressing, cast or splint is removed; you may shower, but do not soak or scrub the wound.  Allow the water to run over it, and then gently pat it dry.  Swelling It is normal for you to have swelling where you had surgery.  To reduce swelling and pain, keep your toes above your nose for at least 3 days after surgery.  It may be necessary to keep your foot or leg elevated for several weeks.  If it hurts, it should be  elevated.  Follow Up Call my office at [phone] when you are discharged from the hospital or surgery center to schedule an appointment to be seen two weeks after surgery.  Call my office at [phone] if you develop a fever >101.5 F, nausea, vomiting, bleeding from the surgical site or severe pain.

## 2013-10-13 NOTE — Brief Op Note (Signed)
10/13/2013  9:05 AM  PATIENT:  Kari Welch  68 y.o. female  PRE-OPERATIVE DIAGNOSIS:  Left HALLUX VALGUS, metatarsus primus varus AND SECOND HAMMERTOE  POST-OPERATIVE DIAGNOSIS:  LEFT HALLUX VALGUS, metatarsus primus varus AND SECOND HAMMERTOE  Procedure(s): 1.  LEFT FIRST METATARSAL SCARF OSTEOTOMY;   2.  LEFT MODIFIED MCBRIDE BUNIONECTOMY 3.  Left SECOND HAMMERTOE CORRECTION 4.  Left second toe open flexor tendon release 5.  Left 2nd MT weil osteotomy 6.  Left foot ap and lateral xrays  SURGEON:  Wylene Simmer, MD  ASSISTANT: n/a  ANESTHESIA:   General, regional  EBL:  minimal   TOURNIQUET:   Total Tourniquet Time Documented: Thigh (Left) - 57 minutes Total: Thigh (Left) - 57 minutes   COMPLICATIONS:  None apparent  DISPOSITION:  Extubated, awake and stable to recovery.  DICTATION ID:  [id number]

## 2013-10-13 NOTE — Anesthesia Postprocedure Evaluation (Signed)
Anesthesia Post Note  Patient: Kari Welch  Procedure(s) Performed: Procedure(s) (LRB): LEFT FIRST METATARSAL SCARF OSTEOTOMY;  LEFT MODIFIED MCBRIDE BUNIONECTOMY AND SECOND HAMMERTOE CORRECTION (Left)  Anesthesia type: General  Patient location: PACU  Post pain: Pain level controlled  Post assessment: Patient's Cardiovascular Status Stable  Last Vitals:  Filed Vitals:   10/13/13 0929  BP: 110/60  Pulse: 64  Temp:   Resp: 16    Post vital signs: Reviewed and stable  Level of consciousness: alert  Complications: No apparent anesthesia complications

## 2013-10-13 NOTE — Progress Notes (Signed)
Assisted Dr. Frederick with left, ultrasound guided, popliteal/saphenous block. Side rails up, monitors on throughout procedure. See vital signs in flow sheet. Tolerated Procedure well. 

## 2013-10-13 NOTE — Op Note (Signed)
NAMETEKLA, MALACHOWSKI NO.:  [id number]  MEDICAL RECORD NO.:  [id number]  LOCATION:                                 FACILITY:  PHYSICIAN:  Wylene Simmer, MD        DATE OF BIRTH:  Aug 21, 1945  DATE OF PROCEDURE:  10/13/2013 DATE OF DISCHARGE:                              OPERATIVE REPORT   PREOPERATIVE DIAGNOSES: 1. Left hallux valgus. 2. Left metatarsus primus varus. 3. Left second hammertoe deformity.  POSTOPERATIVE DIAGNOSES: 1. Left hallux valgus. 2. Left metatarsus primus varus. 3. Left second hammertoe deformity.  PROCEDURE: 1. Left first metatarsal SCARF osteotomy. 2. Left modified McBride bunionectomy. 3. Left second hammertoe correction. 4. Left second toe open flexor tendon release. 5. Left second metatarsal Weil osteotomy. 6. Left foot AP and lateral radiographs.  SURGEON:  Wylene Simmer, MD  ANESTHESIA:  General, regional.  ESTIMATED BLOOD LOSS:  Minimal.  TOURNIQUET TIME:  57 minutes, 220 mmHg.  COMPLICATIONS:  None apparent.  DISPOSITION:  Extubated, awake, and stable to recovery.  INDICATIONS FOR PROCEDURE:  The patient is a 68 year old woman who complains of left forefoot pain related to hallux valgus and second hammertoe deformities.  She has failed nonoperative treatment to date including activity modification, oral anti-inflammatories, and shoe wear modification.  She presents now for operative treatment of these painful conditions.  She understands the risks and benefits, the alternative treatment options, and elects surgical treatment.  She specifically understands risks of bleeding, infection, nerve damage, blood clots, need for additional surgery, amputation, and death.  PROCEDURE IN DETAIL:  After preoperative consent was obtained and the correct operative site was identified, the patient was brought to the operating room and placed supine on the operating table.  General anesthesia was induced.  Preoperative antibiotics  were administered. Surgical time-out was taken.  Left lower extremity was prepped and draped in standard sterile fashion with tourniquet around the thigh. The extremity was exsanguinated.  The tourniquet was inflated to 220 mmHg.  A curvilinear incision was made at the dorsum of the first webspace.  Sharp dissection was carried down through the skin and subcutaneous tissue.  The intermetatarsal ligament was identified.  It was divided under direct vision.  An arthrotomy was then made just dorsal to the abductor hallucis tendon.  The joint capsule was then perforated in several locations.  The hallux could then be positioned in 30 degrees of varus passively.  Attention was then turned to the medial eminence where a longitudinal incision was made.  Sharp dissection was carried down through the skin and subcutaneous tissue.  The joint capsule was incised in line with its fibers and elevated plantarly and dorsally.  The medial eminence was resected with an oscillating saw in line with the metatarsal shaft.  A Scarf osteotomy was then made after marking the corners of the osteotomy with K-wires.  The head of the metatarsal was translated laterally and the osteotomy was provisionally held with a tenaculum.  An AP radiograph confirmed appropriate correction of the intermetatarsal angle.  The osteotomy was fixed with a 2.3-mm screw from the Arthrex QuickFix screw set distally and a second bicortical screw proximally.  The overhanging bone was then trimmed with an oscillating saw.  An AP radiograph confirmed appropriate correction of the hallux valgus and intermetatarsal angles and appropriate position of both screws.  Attention was then returned to the dorsal incision.  The second MTP joint was opened with an arthrotomy medial to the extensor tendon.  The synovitis was excised sharply.  The head of the metatarsal was exposed and a Weil osteotomy was made with the oscillating saw.  The  metatarsal head was allowed to retract proximally 2 mm and the osteotomy was fixed with a 2 mm partially threaded screw.  The overhanging bone was trimmed with a rongeur.  The toe was then noted to be somewhat unstable with deviation towards the hallux.  The decision was made to proceed with hammertoe correction by way of flexor to extensor transfer.  An oblique incision was made at the proximal flexion crease under the second toe.  Sharp dissection was carried down through the skin and subcutaneous tissue taking care to protect the digital vessels and nerves.  Flexor tendon sheath was incised.  The flexor digitorum longus tendon was isolated.  It was released percutaneously from the distal phalanx insertion.  It was withdrawn through the proximal incision and tagged with a 2-0 Vicryl suture.  It was then passed subperiosteally along the base of the proximal phalanx laterally.  The toe was held in a corrected position while the flexor tendon was sutured to the extensor tendon with 2-0 Vicryl figure-of-eight sutures.  Final AP and lateral radiographs confirmed appropriate position and length of all hardware and appropriate correction of the hallux valgus, metatarsus primus varus and second hammertoe deformities.  Both wounds were irrigated copiously. The medial joint capsule was closed with imbricating sutures of 2-0 Vicryl.  Both incisions were closed with 3-0 Monocryl in subcutaneous layer and running 3-0 nylon sutures.  Sterile dressings were applied followed by a bunion wrap and a compression dressing.  The patient's tourniquet was released at 57 minutes.  The patient was then awakened from anesthesia and transported to the recovery room in stable condition.  RADIOGRAPHS:  AP and lateral radiographs of the left foot were obtained intraoperatively today.  These show interval correction of her hallux valgus, metatarsus primus varus and second hammertoe deformities.  All hardware was  appropriately positioned.  No fracture dislocation is noted.  FOLLOWUP PLAN:  The patient will be weightbearing as tolerated on her heel in a Darco wedge style shoe.  She will follow up with me in 2 weeks for suture removal and a toe spacer.  She will resume her anticoagulation today.     Wylene Simmer, MD     JH/MEDQ  D:  10/13/2013  T:  10/13/2013  Job:  [id number]

## 2013-10-13 NOTE — Anesthesia Procedure Notes (Addendum)
Anesthesia Regional Block:  Popliteal block  Pre-Anesthetic Checklist: ,, timeout performed, Correct Patient, Correct Site, Correct Laterality, Correct Procedure, Correct Position, site marked, Risks and benefits discussed,  Surgical consent,  Pre-op evaluation,  At surgeon's request and post-op pain management  Laterality: Left  Prep: chloraprep       Needles:   Needle Type: Other     Needle Length: 9cm 9 cm Needle Gauge: 21 and 21 G    Additional Needles:  Procedures: ultrasound guided (picture in chart) Popliteal block Narrative:  Start time: 10/13/2013 6:58 AM End time: 10/13/2013 7:05 AM Injection made incrementally with aspirations every 5 mL.  Performed by: Personally  Anesthesiologist: Nada Libman, MD  Additional Notes: Ultrasound guidance used to: id relevant anatomy, confirm needle position, local anesthetic spread, avoidance of vascular puncture. Picture saved. No complications. Block performed personally by Jessy Oto. Albertina Parr, MD  .     Procedure Name: LMA Insertion Date/Time: 10/13/2013 7:39 AM Performed by: Levester Waldridge Pre-anesthesia Checklist: Patient identified, Emergency Drugs available, Suction available and Patient being monitored Patient Re-evaluated:Patient Re-evaluated prior to inductionOxygen Delivery Method: Circle System Utilized Preoxygenation: Pre-oxygenation with 100% oxygen Intubation Type: IV induction Ventilation: Mask ventilation without difficulty LMA: LMA inserted LMA Size: 4.0 Number of attempts: 1 Airway Equipment and Method: bite block Placement Confirmation: positive ETCO2 Tube secured with: Tape Dental Injury: Teeth and Oropharynx as per pre-operative assessment

## 2013-10-13 NOTE — H&P (Signed)
Kari Welch is an 68 y.o. female.   Chief Complaint: left forefoot pain HPI: 68 y/o female with left hallux valgus and 2nd hammertoe deformities.  She presents now for operative treatment.  She has been cleared by her hematologist for surgical treatment.  Past Medical History  Diagnosis Date  . Hypertension   . Migraine   . Splenic infarction   . Vasculitis   . Hyperlipidemia   . Allergy     seasonal  . Arthritis   . Clotting disorder     superficial left leg- seeing a vein specialist in August  . PVD (peripheral vascular disease)     takes Pletal to open blood flow of blood to fingers, toes, and lower extremities  . Raynaud's disease   . Abnormal Pap smear     h/o  . Collagen vascular disease   . Internal hemorrhoids   . Hyperplastic colon polyp   . Elevated LFTs   . Wears glasses     Past Surgical History  Procedure Laterality Date  . Dilation and curettage of uterus    . Mass excision Left     hand  . Sympthectomy Left 2008    hand  . Colonoscopy    . Abdominal hysterectomy  1979    TAH/LSO  . Pelvic laparoscopy      d/t infertility  . Cervix lesion destruction  1970  . Laser ablation Bilateral     leg-left 8/14, right 12/14  . Right bartholin's gland cyst excision Right     1990s  . Ablation saphenous vein w/ rfa    . Colonoscopy      Family History  Problem Relation Age of Onset  . Stroke Mother   . Colon cancer Mother   . Heart attack Mother   . Migraines Mother   . Stroke Father   . Heart attack Father   . Esophageal cancer Neg Hx   . Stomach cancer Neg Hx   . Rectal cancer Neg Hx   . Diabetes Maternal Grandmother   . Gallbladder disease Father 17    septic gallbladder   Social History:  reports that she quit smoking about 25 years ago. She has never used smokeless tobacco. She reports that she drinks alcohol. She reports that she does not use illicit drugs.  Allergies:  Allergies  Allergen Reactions  . Prednisone Swelling    short  course=swelling    Medications Prior to Admission  Medication Sig Dispense Refill  . ALPRAZolam (XANAX) 0.5 MG tablet TAKE 1 TABLET BY MOUTH EVERY 8 HOURS AS NEEDED  30 tablet  4  . amLODipine (NORVASC) 5 MG tablet Take 1 tablet by mouth 2 (two) times daily.       Marland Kitchen aspirin (BABY ASPIRIN) 81 MG chewable tablet Chew 81 mg by mouth daily.        . cilostazol (PLETAL) 100 MG tablet Take 100 mg by mouth 2 (two) times daily.        Marland Kitchen estradiol (VIVELLE-DOT) 0.05 MG/24HR patch Place 1 patch (0.05 mg total) onto the skin 2 (two) times a week.  8 patch  8  . FLUoxetine (PROZAC) 40 MG capsule Take 1 capsule (40 mg total) by mouth daily.  90 capsule  3  . HYDROcodone-acetaminophen (NORCO/VICODIN) 5-325 MG per tablet TAKE 1 TABLET BY MOUTH EVERY DAY AS NEEDED  30 tablet  0  . isosorbide mononitrate (IMDUR) 60 MG 24 hr tablet Take 60 mg by mouth daily.        Marland Kitchen  potassium chloride SA (K-DUR,KLOR-CON) 20 MEQ tablet Take 1 tablet (20 mEq total) by mouth daily.  90 tablet  3  . rosuvastatin (CRESTOR) 20 MG tablet Take 10 mg by mouth every other day.      . triamterene-hydrochlorothiazide (MAXZIDE-25) 37.5-25 MG per tablet Take 1 tablet by mouth daily.      Marland Kitchen PROAIR HFA 108 (90 BASE) MCG/ACT inhaler         Results for orders placed during the hospital encounter of 10/25/13 (from the past 48 hour(s))  BASIC METABOLIC PANEL     Status: Abnormal   Collection Time    10/11/13  2:30 PM      Result Value Ref Range   Sodium 139  137 - 147 mEq/L   Potassium 4.2  3.7 - 5.3 mEq/L   Chloride 98  96 - 112 mEq/L   CO2 27  19 - 32 mEq/L   Glucose, Bld 75  70 - 99 mg/dL   BUN 16  6 - 23 mg/dL   Creatinine, Ser 1.09  0.50 - 1.10 mg/dL   Calcium 9.2  8.4 - 10.5 mg/dL   GFR calc non Af Amer 52 (*) >90 mL/min   GFR calc Af Amer 60 (*) >90 mL/min   Comment: (NOTE)     The eGFR has been calculated using the CKD EPI equation.     This calculation has not been validated in all clinical situations.     eGFR's  persistently <90 mL/min signify possible Chronic Kidney     Disease.   Anion gap 14  5 - 15  POCT HEMOGLOBIN-HEMACUE     Status: Abnormal   Collection Time    October 25, 2013  6:49 AM      Result Value Ref Range   Hemoglobin 15.3 (*) 12.0 - 15.0 g/dL   No results found.  ROS  No recent f/c/n//v/wt loss.  Blood pressure 107/74, pulse 61, temperature 97.4 F (36.3 C), temperature source Oral, resp. rate 16, height '5\' 1"'  (1.549 m), weight 58.06 kg (128 lb), last menstrual period 03/31/1977, SpO2 97.00%. Physical Exam  wn wd woman in nad.  A and O x 4.  Mood and affect normal.  EOMI.  resp unlabored.  L foot with hallux valgus and 2nd hammertoe deformities.  Skin healthy and intact.  Sens to LT normal in the foot.  5/5 strength in PF and DF of the toes.  Assessment/Plan L hallux valgus and 2nd hammertoe - to oR for operative treatment.  The risks and benefits of the alternative treatment options have been discussed in detail.  The patient wishes to proceed with surgery and specifically understands risks of bleeding, infection, nerve damage, blood clots, need for additional surgery, amputation and death.   Wylene Simmer 25-Oct-2013, 7:32 AM

## 2013-10-13 NOTE — Transfer of Care (Signed)
Immediate Anesthesia Transfer of Care Note  Patient: KIRSTINE JACQUIN  Procedure(s) Performed: Procedure(s): LEFT FIRST METATARSAL SCARF OSTEOTOMY;  LEFT MODIFIED MCBRIDE BUNIONECTOMY AND SECOND HAMMERTOE CORRECTION (Left)  Patient Location: PACU  Anesthesia Type:GA combined with regional for post-op pain  Level of Consciousness: awake, alert , oriented and patient cooperative  Airway & Oxygen Therapy: Patient Spontanous Breathing and Patient connected to face mask oxygen  Post-op Assessment: Report given to PACU RN and Post -op Vital signs reviewed and stable  Post vital signs: Reviewed and stable  Complications: No apparent anesthesia complications

## 2013-10-17 ENCOUNTER — Telehealth: Payer: Self-pay | Admitting: *Deleted

## 2013-10-17 NOTE — Telephone Encounter (Signed)
pts husband called to let Dr Leanne Chang know that since pt signed a contract for her hydrocodone he wanted Dr Leanne Chang to be aware that pt had surgery on Thursday and Dr Doran Durand gave her hydrocodone 10/325 #15.  Nothing further is needed.  FYI sent to Dr Leanne Chang

## 2013-10-19 ENCOUNTER — Encounter (HOSPITAL_BASED_OUTPATIENT_CLINIC_OR_DEPARTMENT_OTHER): Payer: Self-pay | Admitting: Orthopedic Surgery

## 2013-10-24 ENCOUNTER — Other Ambulatory Visit: Payer: Self-pay | Admitting: Internal Medicine

## 2013-10-25 ENCOUNTER — Other Ambulatory Visit: Payer: Self-pay | Admitting: *Deleted

## 2013-10-25 MED ORDER — HYDROCODONE-ACETAMINOPHEN 10-325 MG PO TABS: 1.0000 | ORAL_TABLET | Freq: Four times a day (QID) | ORAL | Status: DC | PRN

## 2013-11-07 ENCOUNTER — Telehealth: Payer: Self-pay | Admitting: Internal Medicine

## 2013-11-07 NOTE — Telephone Encounter (Signed)
Pt would like become a new pt to dr Shawna Orleans. Can I sch?

## 2013-11-07 NOTE — Telephone Encounter (Signed)
Ok per Dr Shawna Orleans

## 2013-11-07 NOTE — Telephone Encounter (Signed)
Pt is aware.  

## 2013-11-14 ENCOUNTER — Ambulatory Visit: Payer: Medicare Other | Admitting: Internal Medicine

## 2013-11-15 ENCOUNTER — Encounter: Payer: Self-pay | Admitting: Internal Medicine

## 2013-11-29 ENCOUNTER — Encounter: Payer: Self-pay | Admitting: Internal Medicine

## 2013-11-29 ENCOUNTER — Ambulatory Visit (INDEPENDENT_AMBULATORY_CARE_PROVIDER_SITE_OTHER): Payer: Medicare Other | Admitting: Internal Medicine

## 2013-11-29 VITALS — BP 107/71 | HR 99 | Temp 97.8°F | Wt 128.0 lb

## 2013-11-29 DIAGNOSIS — M549 Dorsalgia, unspecified: Secondary | ICD-10-CM

## 2013-11-29 DIAGNOSIS — D7589 Other specified diseases of blood and blood-forming organs: Secondary | ICD-10-CM

## 2013-11-29 DIAGNOSIS — E785 Hyperlipidemia, unspecified: Secondary | ICD-10-CM

## 2013-11-29 DIAGNOSIS — I10 Essential (primary) hypertension: Secondary | ICD-10-CM

## 2013-11-29 DIAGNOSIS — I776 Arteritis, unspecified: Secondary | ICD-10-CM

## 2013-11-29 DIAGNOSIS — F4322 Adjustment disorder with anxiety: Secondary | ICD-10-CM

## 2013-11-29 DIAGNOSIS — G8929 Other chronic pain: Secondary | ICD-10-CM

## 2013-11-29 LAB — LIPID PANEL
CHOL/HDL RATIO: 2
CHOLESTEROL: 170 mg/dL (ref 0–200)
HDL: 69.8 mg/dL (ref >39.00)
LDL CALC: 80 mg/dL (ref 0–99)
NonHDL: 100.2
Triglycerides: 99 mg/dL (ref 0.0–149.0)
VLDL: 19.8 mg/dL (ref 0.0–40.0)

## 2013-11-29 LAB — HEPATIC FUNCTION PANEL
ALT: 40 U/L — ABNORMAL HIGH (ref 0–35)
AST: 41 U/L — AB (ref 0–37)
Albumin: 3.8 g/dL (ref 3.5–5.2)
Alkaline Phosphatase: 55 U/L (ref 39–117)
BILIRUBIN DIRECT: 0.1 mg/dL (ref 0.0–0.3)
BILIRUBIN TOTAL: 0.7 mg/dL (ref 0.2–1.2)
Total Protein: 7.9 g/dL (ref 6.0–8.3)

## 2013-11-29 LAB — BASIC METABOLIC PANEL
BUN: 17 mg/dL (ref 6–23)
CALCIUM: 9.5 mg/dL (ref 8.4–10.5)
CO2: 26 mEq/L (ref 19–32)
Chloride: 101 mEq/L (ref 96–112)
Creatinine, Ser: 1.1 mg/dL (ref 0.4–1.2)
GFR: 54.56 mL/min — AB (ref >60.00)
GLUCOSE: 74 mg/dL (ref 70–99)
POTASSIUM: 3.7 meq/L (ref 3.5–5.1)
Sodium: 136 mEq/L (ref 135–145)

## 2013-11-29 LAB — CBC WITH DIFFERENTIAL/PLATELET
BASOS ABS: 0 10*3/uL (ref 0.0–0.1)
Basophils Relative: 0.7 % (ref 0.0–3.0)
EOS PCT: 1 % (ref 0.0–5.0)
Eosinophils Absolute: 0.1 10*3/uL (ref 0.0–0.7)
HEMATOCRIT: 43.3 % (ref 36.0–46.0)
Hemoglobin: 14.5 g/dL (ref 12.0–15.0)
LYMPHS ABS: 2.2 10*3/uL (ref 0.7–4.0)
LYMPHS PCT: 34.3 % (ref 12.0–46.0)
MCHC: 33.4 g/dL (ref 30.0–36.0)
MCV: 106.1 fl — ABNORMAL HIGH (ref 78.0–100.0)
MONOS PCT: 7.7 % (ref 3.0–12.0)
Monocytes Absolute: 0.5 10*3/uL (ref 0.1–1.0)
NEUTROS PCT: 56.3 % (ref 43.0–77.0)
Neutro Abs: 3.5 10*3/uL (ref 1.4–7.7)
PLATELETS: 415 10*3/uL — AB (ref 150.0–400.0)
RBC: 4.09 Mil/uL (ref 3.87–5.11)
RDW: 14 % (ref 11.5–15.5)
WBC: 6.3 10*3/uL (ref 4.0–10.5)

## 2013-11-29 MED ORDER — HYDROCODONE-ACETAMINOPHEN 10-325 MG PO TABS: 1.0000 | ORAL_TABLET | Freq: Four times a day (QID) | ORAL | Status: DC | PRN

## 2013-11-29 MED ORDER — ROSUVASTATIN CALCIUM 20 MG PO TABS: 20.0000 mg | ORAL_TABLET | ORAL | Status: DC

## 2013-11-29 NOTE — Progress Notes (Signed)
Subjective:    Patient ID: Kari Welch, female    DOB: Dec 09, 1945, 68 y.o.   MRN: [id number]  HPI  68 year old white female with history of hypertension, vasculitis, migraine headache, hyperlipidemia and Raynaud's phenomenon to transfer care from Dr. Leanne Chang. Her medical history reviewed in detail with patient. Chart review also performed.  Social and family history reviewed and updated.  Medication reconciliation performed in detail.  Patient has history of adjustment disorder with anxiety. She has been on fluoxetine for decades. She occasionally has breakthrough symptoms for which she uses alprazolam 0.5 mg as needed. She has tried Paxil in the past but could not tolerate.  We reviewed her history of vasculitis. She has history of digital ischemia. Unclear whether symptoms related to Raynaud's phenomenon. She was treated at [facility] in 2008 with sympathectomy.  Her symptoms are well-controlled on current regimen of imdue, amlodipine and pletal.  She has chronic history of back pain, migraine headache and neuropathic pain from her surgery. She has been taking Vicodin 10/325 as needed.  History of abnormal LFTs - workup negative.  She has been evaluated by Dr. Olevia Perches in the past.    Review of Systems   Respiratory: Negative for cough, chest tightness and shortness of breath.   Cardiovascular: Negative for chest pain.  Neurological: occasional migraine headaches.  Gastrointestinal: Negative for abdominal pain, or heartburn Psych: Negative for depression    Past Medical History  Diagnosis Date  . Hypertension   . Migraine   . Vasculitis   . Hyperlipidemia   . Allergy     seasonal  . Arthritis   . Venous insufficiency     superficial left leg- seeing a vein specialist in August  . PVD (peripheral vascular disease)     takes Pletal to open blood flow of blood to fingers, toes, and lower extremities  . Raynaud's disease   . Abnormal Pap smear     h/o  . Collagen vascular  disease   . Internal hemorrhoids   . Hyperplastic colon polyp   . Elevated LFTs     Work up negative - Dr. Olevia Perches    History   Social History  . Marital Status: Married    Spouse Name: N/A    Number of Children: N/A  . Years of Education: N/A   Occupational History  . Not on file.   Social History Main Topics  . Smoking status: Former Smoker -- 0.50 packs/day for 18 years    Types: Cigarettes    Quit date: 10/10/1988  . Smokeless tobacco: Never Used  . Alcohol Use: 0.0 oz/week     Comment: occ glass of wine  . Drug Use: No  . Sexual Activity: Yes    Partners: Male    Birth Control/ Protection: Surgical     Comment: TAH   Other Topics Concern  . Not on file   Social History Narrative   Married 28 years   3 adopted children    Past Surgical History  Procedure Laterality Date  . Dilation and curettage of uterus    . Mass excision Left     hand  . Sympthectomy Left 2008    hand ([facility])  . Colonoscopy    . Abdominal hysterectomy  1979    TAH/LSO  . Pelvic laparoscopy      d/t infertility  . Cervix lesion destruction  1970  . Laser ablation Bilateral     leg-left 8/14, right 12/14  . Right bartholin's gland cyst excision  Right     1990s  . Ablation saphenous vein w/ rfa    . Colonoscopy    . Bunionectomy with hammertoe reconstruction Left 10/13/2013    Procedure: LEFT FIRST METATARSAL SCARF OSTEOTOMY;  LEFT MODIFIED MCBRIDE BUNIONECTOMY AND SECOND HAMMERTOE CORRECTION;  Surgeon: Wylene Simmer, MD;  Location: [place];  Service: Orthopedics;  Laterality: Left;    Family History  Problem Relation Age of Onset  . Stroke Mother   . Colon cancer Mother   . Heart attack Mother   . Migraines Mother   . Stroke Father   . Heart attack Father   . Esophageal cancer Neg Hx   . Stomach cancer Neg Hx   . Rectal cancer Neg Hx   . Diabetes Maternal Grandmother   . Gallbladder disease Father 32    septic gallbladder    Allergies  Allergen Reactions   . Prednisone Swelling    short course=swelling    Current Outpatient Prescriptions on File Prior to Visit  Medication Sig Dispense Refill  . amLODipine (NORVASC) 5 MG tablet Take 1 tablet by mouth 2 (two) times daily.       Marland Kitchen aspirin (BABY ASPIRIN) 81 MG chewable tablet Chew 81 mg by mouth daily.        . cilostazol (PLETAL) 100 MG tablet Take 100 mg by mouth 2 (two) times daily.        Marland Kitchen estradiol (VIVELLE-DOT) 0.05 MG/24HR patch Place 1 patch (0.05 mg total) onto the skin 2 (two) times a week.  8 patch  8  . FLUoxetine (PROZAC) 40 MG capsule Take 1 capsule (40 mg total) by mouth daily.  90 capsule  3  . isosorbide mononitrate (IMDUR) 60 MG 24 hr tablet Take 60 mg by mouth daily.        . potassium chloride SA (K-DUR,KLOR-CON) 20 MEQ tablet Take 1 tablet (20 mEq total) by mouth daily.  90 tablet  3  . triamterene-hydrochlorothiazide (MAXZIDE-25) 37.5-25 MG per tablet Take 1 tablet by mouth daily.      Marland Kitchen ALPRAZolam (XANAX) 0.5 MG tablet TAKE 1 TABLET EVERY 8 HOURS AS NEEDED.  30 tablet  5  . PROAIR HFA 108 (90 BASE) MCG/ACT inhaler        No current facility-administered medications on file prior to visit.    BP 107/71  Pulse 99  Temp(Src) 97.8 F (36.6 C)  Wt 128 lb (58.06 kg)  LMP 03/31/1977    Objective:   Physical Exam  Constitutional: She is oriented to person, place, and time. She appears well-developed and well-nourished. No distress.  HENT:  Head: Normocephalic and atraumatic.  Right Ear: External ear normal.  Left Ear: External ear normal.  Mouth/Throat: Oropharynx is clear and moist.  Eyes: Conjunctivae and EOM are normal. Pupils are equal, round, and reactive to light.  Neck: Neck supple.  No carotid bruit  Cardiovascular: Normal rate, regular rhythm and normal heart sounds.   No murmur heard. Pulmonary/Chest: Effort normal and breath sounds normal. She has no wheezes.  Abdominal: Soft. Bowel sounds are normal. There is no tenderness.  Musculoskeletal: She  exhibits no edema.  Lymphadenopathy:    She has no cervical adenopathy.  Neurological: She is alert and oriented to person, place, and time. No cranial nerve deficit.  Skin: Skin is warm and dry.  Psychiatric: She has a normal mood and affect.          Assessment & Plan:

## 2013-11-29 NOTE — Assessment & Plan Note (Addendum)
Patient reports chronic back pain, neuropathic pain and migraine headaches. Patient unable to take traditional migraine medications due to her vascular history. Patient unable to take vasoconstricting agents. I suggest patient consider trial of low-dose gabapentin 100 mg once daily.  Continue to use Vicodin for now.

## 2013-11-29 NOTE — Assessment & Plan Note (Signed)
Patient reports history of severe arterial vasospasm that may be related to her Raynaud's phenomenon. She underwent sympathectomy at [facility]  Her symptoms well controlled on Imdue, amlodipine and pletal.

## 2013-11-29 NOTE — Assessment & Plan Note (Signed)
She takes Crestor 10 mg every other day. Monitor fasting lipid panel, TSH and LFTs.

## 2013-11-29 NOTE — Assessment & Plan Note (Signed)
Patient has history of adjustment disorder with anxiety for many years. She has been on fluoxetine 40 mg daily and alprazolam 0.5 mg as needed. We discussed potentially changing fluoxetine to newer SSRI such as Lexapro versus sertraline. It may better control her symptoms and would have less drug interactions.  She tried and weaned of Paxil in the past.

## 2013-11-29 NOTE — Assessment & Plan Note (Signed)
She has history of macrocytosis without anemia. Her B12 level was low-normal. Check methylmalonic acid. Repeat CBC with differential. Continue to monitor for possible myeloproliferative disease.

## 2013-11-29 NOTE — Assessment & Plan Note (Signed)
Well controlled.  Monitor electrolytes and kidney function. BP: 107/71 mmHg

## 2013-11-29 NOTE — Patient Instructions (Signed)
Consider the following medication changes: Switch fluoxetine to Lexapro or sertraline Adding low dose gabapentin 100 mg at bedtime.

## 2013-11-30 LAB — TSH: TSH: 2.16 u[IU]/mL (ref 0.35–4.50)

## 2013-12-01 LAB — METHYLMALONIC ACID, SERUM: METHYLMALONIC ACID, QUANT: 270 nmol/L (ref 87–318)

## 2014-01-05 ENCOUNTER — Other Ambulatory Visit: Payer: Self-pay | Admitting: Internal Medicine

## 2014-01-13 ENCOUNTER — Other Ambulatory Visit: Payer: Self-pay

## 2014-01-13 ENCOUNTER — Ambulatory Visit (INDEPENDENT_AMBULATORY_CARE_PROVIDER_SITE_OTHER): Payer: Medicare Other

## 2014-01-13 DIAGNOSIS — Z23 Encounter for immunization: Secondary | ICD-10-CM

## 2014-01-30 ENCOUNTER — Telehealth: Payer: Self-pay | Admitting: Internal Medicine

## 2014-01-30 ENCOUNTER — Encounter: Payer: Self-pay | Admitting: Internal Medicine

## 2014-01-30 ENCOUNTER — Other Ambulatory Visit: Payer: Self-pay | Admitting: Internal Medicine

## 2014-01-30 MED ORDER — HYDROCODONE-ACETAMINOPHEN 10-325 MG PO TABS: 1.0000 | ORAL_TABLET | Freq: Four times a day (QID) | ORAL | Status: DC | PRN

## 2014-01-30 NOTE — Telephone Encounter (Signed)
rx up front for p/u, pt aware

## 2014-01-30 NOTE — Telephone Encounter (Signed)
Pt needs new rx hydrocodone °

## 2014-01-30 NOTE — Telephone Encounter (Signed)
rx printed and signed

## 2014-02-01 ENCOUNTER — Ambulatory Visit
Admission: RE | Admit: 2014-02-01 | Discharge: 2014-02-01 | Disposition: A | Discharge status: Home / Self Care | Payer: Medicare Other | Source: Ambulatory Visit | Attending: Interventional Radiology | Admitting: Interventional Radiology

## 2014-02-01 DIAGNOSIS — I83813 Varicose veins of bilateral lower extremities with pain: Secondary | ICD-10-CM

## 2014-02-02 NOTE — Progress Notes (Signed)
Chief Complaint: Right anterior thigh pain  Referring Physician(s): McCullough,Heath  History of Present Illness: Kari Welch is a 68 y.o. female well-known to me with bilateral lower extremity symptomatic venous insufficiency and varicosities. She is now status post bilateral lower extremity endovenous laser ablation of the great saphenous veins (Left treated 11/17/12 and Right treated 03/02/13) with supplemental sclerotherapy. She has done remarkably well.  She continues to ride her bike and even went on a walk with her husband for the first time in years.    Over the past several months she has started to notice an increasingly prominent vein along her anterior right thigh which communicates with a network of veins around her anterior knee.  After prolonged standing she notices that these veins bulge and become painful.  She has started wearing her thigh high-surgical grade (20-30 mmHg) compression garment these past few months and notes no significant relief.   Having gone through years of suffering with venous insufficiency she notes that she recognizes these symptoms as early signs of disease and came in to be evaluated before her symptoms and varicose veins progress.    Since I saw her last she has undergone bunion and hammertoe corrective surgery on the left foot.  She has recovered very well from those surgeries.  She has no other active complaints.  Her left leg continues to feel great.     Past Medical History  Diagnosis Date  . Hypertension   . Migraine   . Vasculitis   . Hyperlipidemia   . Allergy     seasonal  . Arthritis   . Venous insufficiency     superficial left leg- seeing a vein specialist in August  . PVD (peripheral vascular disease)     takes Pletal to open blood flow of blood to fingers, toes, and lower extremities  . Raynaud's disease   . Abnormal Pap smear     h/o  . Collagen vascular disease   . Internal hemorrhoids   . Hyperplastic colon polyp   .  Elevated LFTs     Work up negative - Dr. Olevia Perches    Past Surgical History  Procedure Laterality Date  . Dilation and curettage of uterus    . Mass excision Left     hand  . Sympthectomy Left 2008    hand ([facility])  . Colonoscopy    . Abdominal hysterectomy  1979    TAH/LSO  . Pelvic laparoscopy      d/t infertility  . Cervix lesion destruction  1970  . Laser ablation Bilateral     leg-left 8/14, right 12/14  . Right bartholin's gland cyst excision Right     1990s  . Ablation saphenous vein w/ rfa    . Colonoscopy    . Bunionectomy with hammertoe reconstruction Left 10/13/2013    Procedure: LEFT FIRST METATARSAL SCARF OSTEOTOMY;  LEFT MODIFIED MCBRIDE BUNIONECTOMY AND SECOND HAMMERTOE CORRECTION;  Surgeon: Wylene Simmer, MD;  Location: [place];  Service: Orthopedics;  Laterality: Left;    Allergies: Prednisone  Medications: Prior to Admission medications   Medication Sig Start Date End Date Taking? Authorizing Provider  ALPRAZolam (XANAX) 0.5 MG tablet TAKE 1 TABLET EVERY 8 HOURS AS NEEDED.    Lisabeth Pick, MD  amLODipine (NORVASC) 5 MG tablet Take 1 tablet by mouth 2 (two) times daily.  12/12/11   Historical Provider, MD  aspirin (BABY ASPIRIN) 81 MG chewable tablet Chew 81 mg by mouth daily.  Historical Provider, MD  calcium elemental as carbonate (PX ANTACID MAXIMUM STRENGTH) 400 MG tablet Chew by mouth.    Historical Provider, MD  cilostazol (PLETAL) 100 MG tablet Take 100 mg by mouth 2 (two) times daily.      Historical Provider, MD  estradiol (VIVELLE-DOT) 0.05 MG/24HR patch Place 1 patch (0.05 mg total) onto the skin 2 (two) times a week. 07/06/13   [place], MD  FLUoxetine (PROZAC) 40 MG capsule Take 1 capsule (40 mg total) by mouth daily. 02/22/13   Lisabeth Pick, MD  HYDROcodone-acetaminophen (NORCO) 10-325 MG per tablet Take 1-2 tablets by mouth every 6 (six) hours as needed for moderate pain. 01/30/14   Doe-Hyun R Shawna Orleans, DO    isosorbide mononitrate (IMDUR) 60 MG 24 hr tablet Take 60 mg by mouth daily.      Historical Provider, MD  potassium chloride SA (K-DUR,KLOR-CON) 20 MEQ tablet TAKE 1 TABLET DAILY. 01/05/14   Doe-Hyun R Shawna Orleans, DO  PROAIR HFA 108 (90 BASE) MCG/ACT inhaler  08/20/12   Historical Provider, MD  rosuvastatin (CRESTOR) 20 MG tablet Take 1 tablet (20 mg total) by mouth every other day. 11/29/13   Doe-Hyun R Shawna Orleans, DO  triamterene-hydrochlorothiazide (MAXZIDE-25) 37.5-25 MG per tablet Take 1 tablet by mouth daily. 12/12/11   Historical Provider, MD    Family History  Problem Relation Age of Onset  . Stroke Mother   . Colon cancer Mother   . Heart attack Mother   . Migraines Mother   . Stroke Father   . Heart attack Father   . Esophageal cancer Neg Hx   . Stomach cancer Neg Hx   . Rectal cancer Neg Hx   . Diabetes Maternal Grandmother   . Gallbladder disease Father 53    septic gallbladder    History   Social History  . Marital Status: Married    Spouse Name: N/A    Number of Children: N/A  . Years of Education: N/A   Social History Main Topics  . Smoking status: Former Smoker -- 0.50 packs/day for 18 years    Types: Cigarettes    Quit date: 10/10/1988  . Smokeless tobacco: Never Used  . Alcohol Use: 0.0 oz/week     Comment: occ glass of wine  . Drug Use: No  . Sexual Activity:    Partners: Male    Birth Control/ Protection: Surgical     Comment: TAH   Other Topics Concern  . Not on file   Social History Narrative   Married 28 years   3 adopted children    Review of Systems: A 12 point ROS discussed and pertinent positives are indicated in the HPI above.  All other systems are negative.  Review of Systems   Vital Signs: LMP 03/31/1977  Physical Exam  Constitutional: She is oriented to person, place, and time. She appears well-nourished.  HENT:  Head: Normocephalic and atraumatic.  Eyes: No scleral icterus.  Cardiovascular: Normal rate.   Pulmonary/Chest: Effort  normal.  Neurological: She is alert and oriented to person, place, and time.  Skin: Skin is warm and dry.     Superficial varicosity extending down the anterior mid thigh to the knee where it joins a ring of varicosities about the knee anteriorly.  Varicose veins along the course of the GSVs have completely reslved bilaterally.  There is some mild residual hemosiderin staining along the course of teh right GSV in the mid calf.   Psychiatric:  She has a normal mood and affect. Her behavior is normal.  Nursing note and vitals reviewed.   Imaging: No results found.  Labs:  CBC:  Recent Labs  04/19/13 1017 10/13/13 0649 11/29/13 1553  WBC 4.5  --  6.3  HGB 14.5 15.3* 14.5  HCT 43.2  --  43.3  PLT 360.0  --  415.0*    COAGS:  Recent Labs  03/14/13 1038 04/26/13 1521  INR 0.9 1.0    BMP:  Recent Labs  04/19/13 1017 10/11/13 1430 11/29/13 1553  NA 138 139 136  K 3.9 4.2 3.7  CL 104 98 101  CO2 25 27 26   GLUCOSE 93 75 74  BUN 15 16 17   CALCIUM 9.6 9.2 9.5  CREATININE 1.4* 1.09 1.1  GFRNONAA  --  52*  --   GFRAA  --  60*  --     LIVER FUNCTION TESTS:  Recent Labs  03/14/13 1038 04/19/13 1017 07/04/13 1058 11/29/13 1553  BILITOT 0.7 0.6 0.5 0.7  AST 34 55* 48* 41*  ALT 49* 74* 57* 40*  ALKPHOS 65 56 48 55  PROT 7.0 7.9 7.4 7.9  ALBUMIN 3.8 3.8 3.7 3.8    TUMOR MARKERS: No results for input(s): AFPTM, CEA, CA199, CHROMGRNA in the last 8760 hours.  Assessment and Plan:  Mrs. 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  She has failed conservative management with surgical grade compression hose.  Given her known significant history of venous bilateral venous insufficiency and excellent response to prior therapy, she is an excellent candidate for EVLT of this  accessory GSV branch with adjunctive sclerotherapy of any residual symptomatic varices if needed.   I have encouraged her to continue wear her right surgical grade compression garment daily until we can get her scheduled for treatment.    Signed,  Criselda Peaches, MD    I spent a total of 30 minutes face to face in clinical consultation, greater than 50% of which was counseling/coordinating care for symptomatic venous insufficiency.   SignedCriselda Peaches 02/02/2014, 7:48 AM

## 2014-02-09 ENCOUNTER — Other Ambulatory Visit (HOSPITAL_COMMUNITY): Payer: Self-pay | Admitting: Interventional Radiology

## 2014-02-09 DIAGNOSIS — I83811 Varicose veins of right lower extremities with pain: Secondary | ICD-10-CM

## 2014-02-27 ENCOUNTER — Other Ambulatory Visit: Payer: Self-pay | Admitting: Internal Medicine

## 2014-03-02 ENCOUNTER — Telehealth: Payer: Self-pay | Admitting: Internal Medicine

## 2014-03-02 ENCOUNTER — Ambulatory Visit
Admission: RE | Admit: 2014-03-02 | Discharge: 2014-03-02 | Disposition: A | Discharge status: Home / Self Care | Payer: Medicare Other | Source: Ambulatory Visit | Attending: Interventional Radiology | Admitting: Interventional Radiology

## 2014-03-02 DIAGNOSIS — I83811 Varicose veins of right lower extremities with pain: Secondary | ICD-10-CM

## 2014-03-02 NOTE — Telephone Encounter (Signed)
Pt had a procedure done with dr  Laurence Ferrari at [facility] and was given 10 percocet.

## 2014-03-03 ENCOUNTER — Ambulatory Visit: Payer: Medicare Other

## 2014-03-03 ENCOUNTER — Other Ambulatory Visit: Payer: Medicare Other

## 2014-03-03 NOTE — Telephone Encounter (Signed)
Just an FYI, she had signed a contract so she lets [place] know when she gets medicines from other physicians

## 2014-03-06 NOTE — Telephone Encounter (Signed)
Please update her mediation record.

## 2014-03-08 ENCOUNTER — Other Ambulatory Visit: Payer: Self-pay

## 2014-03-08 DIAGNOSIS — Z1231 Encounter for screening mammogram for malignant neoplasm of breast: Secondary | ICD-10-CM

## 2014-03-09 ENCOUNTER — Ambulatory Visit
Admission: RE | Admit: 2014-03-09 | Discharge: 2014-03-09 | Disposition: A | Discharge status: Home / Self Care | Payer: Medicare Other | Source: Ambulatory Visit | Attending: Interventional Radiology | Admitting: Interventional Radiology

## 2014-03-09 DIAGNOSIS — I83811 Varicose veins of right lower extremities with pain: Secondary | ICD-10-CM

## 2014-03-09 NOTE — Progress Notes (Signed)
Patient ID: Kari Welch, female   DOB: [birth year], 68 y.o.   MRN: [id number]   Chief Complaint: Right saphenous venous insufficiency, leg pain and varicose veins   Referring Physician(s): McCullough,Heath K  History of Present Illness: Kari Welch is a 68 y.o. female who is one-week status post proximal right anterolateral saphenous transcatheter laser occlusion for venous insufficiency, leg pain and varicose veins. She has recovered very well over the last week. She has been compliant with 20/30 mmHg compression stockings and daily walking. Minor tenderness and bruising at the proximal thigh treated site. No interval fevers. Overall she is recovering at home very well.   Past Medical History  Diagnosis Date  . Hypertension   . Migraine   . Vasculitis   . Hyperlipidemia   . Allergy     seasonal  . Arthritis   . Venous insufficiency     superficial left leg- seeing a vein specialist in August  . PVD (peripheral vascular disease)     takes Pletal to open blood flow of blood to fingers, toes, and lower extremities  . Raynaud's disease   . Abnormal Pap smear     h/o  . Collagen vascular disease   . Internal hemorrhoids   . Hyperplastic colon polyp   . Elevated LFTs     Work up negative - Dr. Olevia Perches    Past Surgical History  Procedure Laterality Date  . Dilation and curettage of uterus    . Mass excision Left     hand  . Sympthectomy Left 2008    hand ([facility])  . Colonoscopy    . Abdominal hysterectomy  1979    TAH/LSO  . Pelvic laparoscopy      d/t infertility  . Cervix lesion destruction  1970  . Laser ablation Bilateral     leg-left 8/14, right 12/14  . Right bartholin's gland cyst excision Right     1990s  . Ablation saphenous vein w/ rfa    . Colonoscopy    . Bunionectomy with hammertoe reconstruction Left 10/13/2013    Procedure: LEFT FIRST METATARSAL SCARF OSTEOTOMY;  LEFT MODIFIED MCBRIDE BUNIONECTOMY AND SECOND HAMMERTOE CORRECTION;  Surgeon: Wylene Simmer, MD;   Location: [facility];  Service: Orthopedics;  Laterality: Left;    Allergies: Prednisone  Medications: Prior to Admission medications   Medication Sig Start Date End Date Taking? Authorizing Provider  ALPRAZolam (XANAX) 0.5 MG tablet TAKE 1 TABLET EVERY 8 HOURS AS NEEDED.    Lisabeth Pick, MD  amLODipine (NORVASC) 5 MG tablet Take 1 tablet by mouth 2 (two) times daily.  12/12/11   Historical Provider, MD  aspirin (BABY ASPIRIN) 81 MG chewable tablet Chew 81 mg by mouth daily.      Historical Provider, MD  calcium elemental as carbonate (PX ANTACID MAXIMUM STRENGTH) 400 MG tablet Chew by mouth.    Historical Provider, MD  cilostazol (PLETAL) 100 MG tablet Take 100 mg by mouth 2 (two) times daily.      Historical Provider, MD  estradiol (VIVELLE-DOT) 0.05 MG/24HR patch Place 1 patch (0.05 mg total) onto the skin 2 (two) times a week. 07/06/13   [place], MD  FLUoxetine (PROZAC) 40 MG capsule TAKE (1) CAPSULE DAILY. 02/27/14   Doe-Hyun R Shawna Orleans, DO  HYDROcodone-acetaminophen (NORCO) 10-325 MG per tablet Take 1-2 tablets by mouth every 6 (six) hours as needed for moderate pain. 01/30/14   Doe-Hyun R Shawna Orleans, DO  isosorbide mononitrate (IMDUR)  60 MG 24 hr tablet Take 60 mg by mouth daily.      Historical Provider, MD  potassium chloride SA (K-DUR,KLOR-CON) 20 MEQ tablet TAKE 1 TABLET DAILY. 01/05/14   Doe-Hyun R Shawna Orleans, DO  PROAIR HFA 108 (90 BASE) MCG/ACT inhaler  08/20/12   Historical Provider, MD  rosuvastatin (CRESTOR) 20 MG tablet Take 1 tablet (20 mg total) by mouth every other day. 11/29/13   Doe-Hyun R Shawna Orleans, DO  triamterene-hydrochlorothiazide (MAXZIDE-25) 37.5-25 MG per tablet Take 1 tablet by mouth daily. 12/12/11   Historical Provider, MD    Family History  Problem Relation Age of Onset  . Stroke Mother   . Colon cancer Mother   . Heart attack Mother   . Migraines Mother   . Stroke Father   . Heart attack Father   . Esophageal cancer Neg Hx   . Stomach  cancer Neg Hx   . Rectal cancer Neg Hx   . Diabetes Maternal Grandmother   . Gallbladder disease Father 79    septic gallbladder    History   Social History  . Marital Status: Married    Spouse Name: N/A    Number of Children: N/A  . Years of Education: N/A   Social History Main Topics  . Smoking status: Former Smoker -- 0.50 packs/day for 18 years    Types: Cigarettes    Quit date: 10/10/1988  . Smokeless tobacco: Never Used  . Alcohol Use: 0.0 oz/week     Comment: occ glass of wine  . Drug Use: No  . Sexual Activity:    Partners: Male    Birth Control/ Protection: Surgical     Comment: TAH   Other Topics Concern  . Not on file   Social History Narrative   Married 28 years   3 adopted children     Review of Systems  Constitutional: Negative for fever, chills and activity change.  Respiratory: Negative for shortness of breath.   Cardiovascular: Negative for chest pain.  Musculoskeletal: Negative for joint swelling and arthralgias.  Skin: Positive for color change.       Diffuse ecchymosis in the anterior proximal thigh related to the laser treatment.     Vital Signs: LMP 03/31/1977  Physical Exam  Constitutional: She appears well-developed and well-nourished. No distress.  Musculoskeletal: She exhibits no edema or tenderness.  Diffuse ecchymosis in the anterior proximal thigh related to the laser treatment. No induration, drainage or hematoma. No signs of cellulitis or thrombophlebitis. No significant peripheral edema. Normal pedal pulses. entry site is well-healed.  Skin: She is not diaphoretic.    Imaging: US Venous Img Lower Unilateral Right  03/09/2014   CLINICAL DATA:  One week status post right anterior lateral saphenous transcatheter laser occlusion for venous insufficiency and varicose veins  EXAM: RIGHT LOWER EXTREMITY VENOUS DOPPLER ULTRASOUND  TECHNIQUE: Gray-scale sonography with graded compression, as well as color Doppler and duplex ultrasound  were performed to evaluate the lower extremity deep venous systems from the level of the common femoral vein and including the common femoral, femoral, profunda femoral, popliteal and calf veins including the posterior tibial, peroneal and gastrocnemius veins when visible. The superficial great saphenous vein was also interrogated. Spectral Doppler was utilized to evaluate flow at rest and with distal augmentation maneuvers in the common femoral, femoral and popliteal veins.  COMPARISON:  None.  FINDINGS: Right anterior lateral saphenous segment in the mid thigh demonstrates thrombotic occlusion extending to the saphenous femoral junction proximally. The entire treated  segment remains occluded. No early recanalization. No significant sub surface varicosities in the region demonstrated. Negative for DVT. Right common femoral, femoral and popliteal veins demonstrate normal compressibility, phasicity and augmentation about DVT.  IMPRESSION: Right anterolateral saphenous treated segment remains occluded at 1 week.  Negative for DVT.   Electronically Signed   By: Daryll Brod M.D.   On: 03/09/2014 14:07   Ir Embo Venous Not [place]  03/02/2014   CLINICAL DATA:  68 year old female with a history of significant bilateral lower extremity venous insufficiency status post bilateral EVLT and adjunctive sclerotherapy. She recently demonstrated progression of her underlying venous insufficiency with an enlarging and now incompetent right anterior accessory GSV which supplies symptomatic superficial venous varicosities in the distal anterior thigh and around the knee. She presents today for treatment.  EXAM: ENDOVASCULAR LASER TREATMENT TO THE  GREAT SAPHENOUS VEIN.  Physician:  Criselda Peaches, MD  MEDICATIONS AND MEDICAL HISTORY: 50 mg Valium were administered p.o. 30 min prior to the procedure.  ANESTHESIA/SEDATION: Moderate sedation time: None  PROCEDURE: The procedure was explained to the  patient. The risks and benefits of the procedure were discussed and that patient's questions were addressed. Informed consent was obtained from the patient. The patient was placed in a supine position. The right lower extremity superficial venous system was evaluated with ultrasound. The right anterior accessory great saphenous vein was identified and the vein path was marked on the skin. The right leg and groin were prepped with Betadine and a sterile drape was placed. The skin of the mid thigh was anesthetized with 1% lidocaine. A 21 gauge needle was directed into the anterior accessory great saphenous vein with ultrasound guidance and a micropuncture dilator set was placed. Wire was advanced into the proximal anterior accessory great saphenous vein. A 0.035 wire was advanced to the saphenofemoral junction with ultrasound guidance. A 7 cm sheath was placed over the wire and positioned distal to the saphenofemoral junction. Dilute lidocaine was used as a tumescent. The anterior accessory great saphenous vein from the percutaneous entry site to the saphenofemoral junction was treated with 100 ml of the tumescent. The laser treatment was performed with 558 joules over 70 seconds. The sheath and laser were removed as a single entity. Manual compression was placed over the percutaneous entry site. Dressings were placed over the puncture sites and the leg was placed in a compression stocking.  FINDINGS: The anterior accessory great saphenous vein was accessed in the mid thigh and the laser was positioned great than 1.5 cm from the saphenofemoral junction.  IMPRESSION: Successful transcatheter laser occlusion of the right anterior accessory great saphenous vein with ultrasound guidance.  Signed,  Criselda Peaches, MD  [facility]  [facility]   Electronically Signed   By: Jacqulynn Cadet M.D.   On: 03/02/2014 15:33    Labs:  CBC:  Recent Labs  04/19/13 1017  10/13/13 0649 11/29/13 1553  WBC 4.5  --  6.3  HGB 14.5 15.3* 14.5  HCT 43.2  --  43.3  PLT 360.0  --  415.0*    COAGS:  Recent Labs  03/14/13 1038 04/26/13 1521  INR 0.9 1.0    BMP:  Recent Labs  04/19/13 1017 10/11/13 1430 11/29/13 1553  NA 138 139 136  K 3.9 4.2 3.7  CL 104 98 101  CO2 25 27 26   GLUCOSE 93 75 74  BUN 15 16 17   CALCIUM 9.6 9.2 9.5  CREATININE 1.4* 1.09 1.1  GFRNONAA  --  52*  --   GFRAA  --  60*  --     LIVER FUNCTION TESTS:  Recent Labs  03/14/13 1038 04/19/13 1017 07/04/13 1058 11/29/13 1553  BILITOT 0.7 0.6 0.5 0.7  AST 34 55* 48* 41*  ALT 49* 74* 57* 40*  ALKPHOS 65 56 48 55  PROT 7.0 7.9 7.4 7.9  ALBUMIN 3.8 3.8 3.7 3.8     Assessment and Plan:  One-week status post right anterolateral saphenous transcatheter laser occlusion for venous insufficiency, varicose veins and leg pain. She is recovering very well at one week and been compliant with compression stockings and daily walking. Ultrasound demonstrates occlusion of the treated segment with no early recannulization or complication. Negative for DVT.  Plan: Follow-up in one month with Dr. Laurence Ferrari    I spent a total20 minutes face to face in clinical consultation, greater than 50% of which was counseling/coordinating care for  the patient's right lower extremity.  SignedGreggory Keen T. 03/09/2014, 2:22 PM

## 2014-03-14 ENCOUNTER — Other Ambulatory Visit: Payer: Self-pay | Admitting: Obstetrics and Gynecology

## 2014-03-15 NOTE — Telephone Encounter (Signed)
Medication refill request: Vivelle Dot Last AEX:  04/04/13 Next AEX: 04/05/14 Last MMG (if hormonal medication request): 03/19/13 [id number]:neg Refill authorized: 07/06/13 #[id number] refills

## 2014-03-16 ENCOUNTER — Telehealth: Payer: Self-pay | Admitting: Obstetrics and Gynecology

## 2014-03-16 MED ORDER — ESTRADIOL 0.05 MG/24HR TD PTTW: 1.0000 | MEDICATED_PATCH | TRANSDERMAL | Status: DC

## 2014-03-16 NOTE — Telephone Encounter (Signed)
Patient calling, reports very concerned about her rx refill request.  Order was placed for 2 weeks when rx usually placed for 90 day supply.  Patient states there is an $85.00 difference between obtaining 90 day supply and 30 day supply.  Patient is current with annual exam with Dr. Quincy Simmonds and has 03/2014 appointment scheduled. Current with Mammogram and has mammogram scheduled for 04/03/14 at [facility].  Patient requests 90 day supply. Spoke with nursing supervisor, Lamont Snowball, RN okay per rx protocol that patient may have 90 day supply since current and has annual exam scheduled. Patient notified new rx sent to [facility]. Routing to provider for final review. Patient agreeable to disposition. Will close encounter

## 2014-03-16 NOTE — Telephone Encounter (Signed)
Pt calling with questions regarding her hormone patch.

## 2014-04-03 ENCOUNTER — Ambulatory Visit
Admission: RE | Admit: 2014-04-03 | Discharge: 2014-04-03 | Disposition: A | Discharge status: Home / Self Care | Payer: Medicare Other | Source: Ambulatory Visit

## 2014-04-03 DIAGNOSIS — Z1231 Encounter for screening mammogram for malignant neoplasm of breast: Secondary | ICD-10-CM

## 2014-04-05 ENCOUNTER — Ambulatory Visit
Admission: RE | Admit: 2014-04-05 | Discharge: 2014-04-05 | Disposition: A | Discharge status: Home / Self Care | Payer: Medicare Other | Source: Ambulatory Visit | Attending: Interventional Radiology | Admitting: Interventional Radiology

## 2014-04-05 ENCOUNTER — Ambulatory Visit: Payer: Medicare Other | Admitting: Obstetrics and Gynecology

## 2014-04-05 DIAGNOSIS — I83811 Varicose veins of right lower extremities with pain: Secondary | ICD-10-CM

## 2014-04-05 NOTE — Progress Notes (Signed)
Chief Complaint: Venous Insufficiency  Referring Physician(s): Kanai Berrios  History of Present Illness: Kari Welch is a 69 y.o. female well-known to me with a long history of severe bilateral venous insufficiency and symptomatic venous varicosities. She most recently underwent endovenous laser ablation of an accessory anterior great saphenous vein on the right 1 month ago and presents for her scheduled follow-up evaluation.  She has done well since her last visit. Her symptoms have significantly improved. She no longer has heaviness, tiredness and aching in the right thigh. She does have a few small superficial venous varicosities which bulge and are mildly tender in the medial aspect of her proximal lower leg just below the knee. The varicosities encircling her patella remained visible but are no longer symptomatic.  She denies any systemic symptoms.  Past Medical History  Diagnosis Date  . Hypertension   . Migraine   . Vasculitis   . Hyperlipidemia   . Allergy     seasonal  . Arthritis   . Venous insufficiency     superficial left leg- seeing a vein specialist in August  . PVD (peripheral vascular disease)     takes Pletal to open blood flow of blood to fingers, toes, and lower extremities  . Raynaud's disease   . Abnormal Pap smear     h/o  . Collagen vascular disease   . Internal hemorrhoids   . Hyperplastic colon polyp   . Elevated LFTs     Work up negative - Dr. Olevia Perches    Past Surgical History  Procedure Laterality Date  . Dilation and curettage of uterus    . Mass excision Left     hand  . Sympthectomy Left 2008    hand ([facility])  . Colonoscopy    . Abdominal hysterectomy  1979    TAH/LSO  . Pelvic laparoscopy      d/t infertility  . Cervix lesion destruction  1970  . Laser ablation Bilateral     leg-left 8/14, right 12/14  . Right bartholin's gland cyst excision Right     1990s  . Ablation saphenous vein w/ rfa    . Colonoscopy    . Bunionectomy  with hammertoe reconstruction Left 10/13/2013    Procedure: LEFT FIRST METATARSAL SCARF OSTEOTOMY;  LEFT MODIFIED MCBRIDE BUNIONECTOMY AND SECOND HAMMERTOE CORRECTION;  Surgeon: Wylene Simmer, MD;  Location: [facility];  Service: Orthopedics;  Laterality: Left;    Allergies: Prednisone  Medications: Prior to Admission medications   Medication Sig Start Date End Date Taking? Authorizing Provider  ALPRAZolam (XANAX) 0.5 MG tablet TAKE 1 TABLET EVERY 8 HOURS AS NEEDED.    Lisabeth Pick, MD  amLODipine (NORVASC) 5 MG tablet Take 1 tablet by mouth 2 (two) times daily.  12/12/11   Historical Provider, MD  aspirin (BABY ASPIRIN) 81 MG chewable tablet Chew 81 mg by mouth daily.      Historical Provider, MD  calcium elemental as carbonate (PX ANTACID MAXIMUM STRENGTH) 400 MG tablet Chew by mouth.    Historical Provider, MD  cilostazol (PLETAL) 100 MG tablet Take 100 mg by mouth 2 (two) times daily.      Historical Provider, MD  estradiol (VIVELLE-DOT) 0.05 MG/24HR patch APPLY ONE PATCH TWICE A WEEK, CHANGE SITES AND REAPPLY AS DIRECTED. 03/15/14   Brook E Amundson de Berton Lan, MD  estradiol (VIVELLE-DOT) 0.05 MG/24HR patch Place 1 patch (0.05 mg total) onto the skin 2 (two) times a week. 03/16/14   Brook E  Amundson de Berton Lan, MD  FLUoxetine (PROZAC) 40 MG capsule TAKE (1) CAPSULE DAILY. 02/27/14   Doe-Hyun R Shawna Orleans, DO  HYDROcodone-acetaminophen (NORCO) 10-325 MG per tablet Take 1-2 tablets by mouth every 6 (six) hours as needed for moderate pain. 01/30/14   Doe-Hyun R Shawna Orleans, DO  isosorbide mononitrate (IMDUR) 60 MG 24 hr tablet Take 60 mg by mouth daily.      Historical Provider, MD  potassium chloride SA (K-DUR,KLOR-CON) 20 MEQ tablet TAKE 1 TABLET DAILY. 01/05/14   Doe-Hyun R Shawna Orleans, DO  PROAIR HFA 108 (90 BASE) MCG/ACT inhaler  08/20/12   Historical Provider, MD  rosuvastatin (CRESTOR) 20 MG tablet Take 1 tablet (20 mg total) by mouth every other day. 11/29/13   Doe-Hyun R Shawna Orleans, DO    triamterene-hydrochlorothiazide (MAXZIDE-25) 37.5-25 MG per tablet Take 1 tablet by mouth daily. 12/12/11   Historical Provider, MD    Family History  Problem Relation Age of Onset  . Stroke Mother   . Colon cancer Mother   . Heart attack Mother   . Migraines Mother   . Stroke Father   . Heart attack Father   . Esophageal cancer Neg Hx   . Stomach cancer Neg Hx   . Rectal cancer Neg Hx   . Diabetes Maternal Grandmother   . Gallbladder disease Father 34    septic gallbladder    History   Social History  . Marital Status: Married    Spouse Name: N/A    Number of Children: N/A  . Years of Education: N/A   Social History Main Topics  . Smoking status: Former Smoker -- 0.50 packs/day for 18 years    Types: Cigarettes    Quit date: 10/10/1988  . Smokeless tobacco: Never Used  . Alcohol Use: 0.0 oz/week     Comment: occ glass of wine  . Drug Use: No  . Sexual Activity:    Partners: Male    Birth Control/ Protection: Surgical     Comment: TAH   Other Topics Concern  . Not on file   Social History Narrative   Married 28 years   3 adopted children    Review of Systems: A 12 point ROS discussed and pertinent positives are indicated in the HPI above.  All other systems are negative.  Review of Systems  Vital Signs: LMP 03/31/1977  Physical Exam  Constitutional: She appears well-developed and well-nourished.  HENT:  Head: Normocephalic and atraumatic.  Eyes: No scleral icterus.  Pulmonary/Chest: Effort normal. No respiratory distress.  Skin: Skin is warm and dry.     Mild ecchymosis in the medial aspect of the upper to mid thigh at the site of the ablated accessory great saphenous vein. There is no tenderness to palpation or evidence of infection. The skin entry site is well-healed.  Small cluster of visible superficial venous varicosities in the medial aspect of the proximal right lower leg just below the level of the knee.      Imaging: US Venous Img  Lower Unilateral Right  04/05/2014   CLINICAL DATA:  69 year old female with bilateral symptomatic venous insufficiency. She is 1 month status post endovenous laser ablation of a right anterior accessory great saphenous vein.  EXAM: RIGHT LOWER EXTREMITY VENOUS DUPLEX ULTRASOUND  TECHNIQUE: Gray-scale sonography with graded compression, as well as color Doppler and duplex ultrasound, were performed to evaluate the deep and superficial veins of both lower extremities. Spectral Doppler was utilized to evaluate flow at rest and with distal augmentation  maneuvers. A complete superficial venous insufficiency exam was performed in the upright standing position. I personally performed the technical portion of the exam.  COMPARISON:  Prior duplex ultrasound 03/09/2014  FINDINGS: Deep Venous System:  Evaluation of the deep venous system including the common femoral, femoral, profunda femoral, popliteal and calf veins (where visible) demonstrate no evidence of deep venous thrombosis. The vessels are compressible and demonstrate normal respiratory phasicity and response to augmentation. No evidence of the deep venous reflux.  Superficial Venous System:  Successful occlusion of the anterior accessory GSV beginning approximately 3 cm from the saphenous femoral junction and extending into the mid to lower thigh at the access site. There are several small persistently patent superficial venous varicosities around and just below the knee. The varicosities below the knee are symptomatic in would be amenable to percutaneous sclerotherapy.  IMPRESSION: 1. No evidence of deep venous thrombosis. 2. Successful occlusion of the right anterior accessory great saphenous vein. 3. Several small clusters of superficial venous varicosities remain patent and symptomatic in the proximal lower leg just below the knee. Signed,  Criselda Peaches, MD  [facility]  [facility]   Electronically Signed    By: Jacqulynn Cadet M.D.   On: 04/05/2014 16:36   US Venous Img Lower Unilateral Right  03/09/2014   CLINICAL DATA:  One week status post right anterior lateral saphenous transcatheter laser occlusion for venous insufficiency and varicose veins  EXAM: RIGHT LOWER EXTREMITY VENOUS DOPPLER ULTRASOUND  TECHNIQUE: Gray-scale sonography with graded compression, as well as color Doppler and duplex ultrasound were performed to evaluate the lower extremity deep venous systems from the level of the common femoral vein and including the common femoral, femoral, profunda femoral, popliteal and calf veins including the posterior tibial, peroneal and gastrocnemius veins when visible. The superficial great saphenous vein was also interrogated. Spectral Doppler was utilized to evaluate flow at rest and with distal augmentation maneuvers in the common femoral, femoral and popliteal veins.  COMPARISON:  None.  FINDINGS: Right anterior lateral saphenous segment in the mid thigh demonstrates thrombotic occlusion extending to the saphenous femoral junction proximally. The entire treated segment remains occluded. No early recanalization. No significant sub surface varicosities in the region demonstrated. Negative for DVT. Right common femoral, femoral and popliteal veins demonstrate normal compressibility, phasicity and augmentation about DVT.  IMPRESSION: Right anterolateral saphenous treated segment remains occluded at 1 week.  Negative for DVT.   Electronically Signed   By: Daryll Brod M.D.   On: 03/09/2014 14:07   [place] Injec Sclerotherapy Single  04/05/2014   CLINICAL DATA:  Persistent symptomatic varicose veins of the anteromedial lower leg just below been the despite technically successful laser closure of the refluxing anterior accessory great saphenous vein.  EXAM: ULTRASOUND INJECTION SCLEROTHERAPHY SINGLE  TECHNIQUE: Survey ultrasound of the left lower extremity was performed and appropriate skin entry sites were  marked. Sites were prepped with alcohol and under direct ultrasound guidance a foam of 2 ml sodium tetradecyl sulfate (diluted to 0.7%) was injected in small aliquots into the residual patent symptomatic varicose veins until there was good filling of the affected regions. The patient tolerated the procedure well, with no immediate complication.  COMPARISON:  None  IMPRESSION: Technically successful foam sclerotherapy of residual symptomatic left lower extremity varicose veins.  Signed,  Criselda Peaches, MD  [facility]  [facility]   Electronically Signed   By: Jacqulynn Cadet M.D.   On: 04/05/2014  16:07   Mm Screening Breast Tomo Bilateral  04/03/2014   CLINICAL DATA:  Screening.  EXAM: DIGITAL SCREENING BILATERAL MAMMOGRAM WITH 3D TOMO WITH CAD  COMPARISON:  Previous exam(s).  ACR Breast Density Category d: The breast tissue is extremely dense, which lowers the sensitivity of mammography.  FINDINGS: There are no findings suspicious for malignancy. Images were processed with CAD.  IMPRESSION: No mammographic evidence of malignancy. A result letter of this screening mammogram will be mailed directly to the patient.  RECOMMENDATION: Screening mammogram in one year. (Code:SM-B-01Y)  BI-RADS CATEGORY  1: Negative.   Electronically Signed   By: Conchita Paris M.D.   On: 04/03/2014 14:42    Labs:  CBC:  Recent Labs  04/19/13 1017 10/13/13 0649 11/29/13 1553  WBC 4.5  --  6.3  HGB 14.5 15.3* 14.5  HCT 43.2  --  43.3  PLT 360.0  --  415.0*    COAGS:  Recent Labs  04/26/13 1521  INR 1.0    BMP:  Recent Labs  04/19/13 1017 10/11/13 1430 11/29/13 1553  NA 138 139 136  K 3.9 4.2 3.7  CL 104 98 101  CO2 25 27 26   GLUCOSE 93 75 74  BUN 15 16 17   CALCIUM 9.6 9.2 9.5  CREATININE 1.4* 1.09 1.1  GFRNONAA  --  52*  --   GFRAA  --  60*  --      Assessment and Plan:  Doing very well status post endovenous laser ablation of her right  anterior accessory great saphenous vein. She describes significant interval improvement in her clinical symptoms of heaviness, and tiredness in the right lower extremity.   She has a few small residual symptomatic superficial venous varicosities just below the knee. These are amenable to sclerotherapy which will be performed today.  - Continue use of compression hose for another 2 weeks.  Signed,  Criselda Peaches, MD    I spent a total of 10 minutes face to face in clinical consultation, greater than 50% of which was counseling/coordinating care for symptomatic venous insufficiency with varicose veins.  SignedJacqulynn Cadet 04/05/2014, 4:42 PM

## 2014-04-07 ENCOUNTER — Ambulatory Visit (INDEPENDENT_AMBULATORY_CARE_PROVIDER_SITE_OTHER): Payer: Medicare Other | Admitting: Obstetrics and Gynecology

## 2014-04-07 ENCOUNTER — Encounter: Payer: Self-pay | Admitting: Obstetrics and Gynecology

## 2014-04-07 VITALS — BP 110/66 | HR 90 | Resp 16 | Ht 61.0 in | Wt 131.0 lb

## 2014-04-07 DIAGNOSIS — N6310 Unspecified lump in the right breast, unspecified quadrant: Secondary | ICD-10-CM

## 2014-04-07 DIAGNOSIS — N63 Unspecified lump in breast: Secondary | ICD-10-CM

## 2014-04-07 DIAGNOSIS — N6320 Unspecified lump in the left breast, unspecified quadrant: Secondary | ICD-10-CM

## 2014-04-07 DIAGNOSIS — Z01419 Encounter for gynecological examination (general) (routine) without abnormal findings: Secondary | ICD-10-CM

## 2014-04-07 MED ORDER — ESTRADIOL 0.05 MG/24HR TD PTTW: 1.0000 | MEDICATED_PATCH | TRANSDERMAL | Status: DC

## 2014-04-07 NOTE — Progress Notes (Signed)
Patient ID: Kari Welch, female   DOB: 24-Dec-1945, 69 y.o.   MRN: [id number] 68 y.o. G0P0 MarriedCaucasianF here for annual exam.   Wants to continue on ERT.   Had normal mammogram - extremely dense breast tissue.   Had blood work with PCP - all good.   PCP:  Mikeal Hawthorne, MD  Patient's last menstrual period was 03/31/1977.          Sexually active: Yes.  female partner.  The current method of family planning is status post hysterectomy.    Exercising: Yes.    walking. Smoker:  Former smoker  Health Maintenance: Pap:  06-18-00 normal History of abnormal Pap: Yes, hx of cryotherapy to cervix in 1970's. MMG:  04-03-14 extremely dense/wnl:[facility] Colonoscopy:  11-03-12 wnl with Dr. Delfin Edis.  Next colonoscopy 10/2017 due to history of colon polyps. BMD:   04-27-13 Osteopenia of hip:[facility] TDaP:  2014 Screening Labs:   Hb today: PCP, Urine today: unable to void   reports that she quit smoking about 25 years ago. Her smoking use included Cigarettes. She has a 9 pack-year smoking history. She has never used smokeless tobacco. She reports that she drinks alcohol. She reports that she does not use illicit drugs.  Past Medical History  Diagnosis Date  . Hypertension   . Migraine   . Vasculitis   . Hyperlipidemia   . Allergy     seasonal  . Arthritis   . Venous insufficiency     superficial left leg- seeing a vein specialist in August  . PVD (peripheral vascular disease)     takes Pletal to open blood flow of blood to fingers, toes, and lower extremities  . Raynaud's disease   . Abnormal Pap smear     h/o  . Collagen vascular disease   . Internal hemorrhoids   . Hyperplastic colon polyp   . Elevated LFTs     Work up negative - Dr. Olevia Perches    Past Surgical History  Procedure Laterality Date  . Dilation and curettage of uterus    . Mass excision Left     hand  . Sympthectomy Left 2008    hand ([facility])  . Colonoscopy    . Abdominal hysterectomy  1979   TAH/LSO  . Pelvic laparoscopy      d/t infertility  . Cervix lesion destruction  1970  . Laser ablation Bilateral     leg-left 8/14, right 12/14  . Right bartholin's gland cyst excision Right     1990s  . Ablation saphenous vein w/ rfa    . Colonoscopy    . Bunionectomy with hammertoe reconstruction Left 10/13/2013    Procedure: LEFT FIRST METATARSAL SCARF OSTEOTOMY;  LEFT MODIFIED MCBRIDE BUNIONECTOMY AND SECOND HAMMERTOE CORRECTION;  Surgeon: Wylene Simmer, MD;  Location: [facility];  Service: Orthopedics;  Laterality: Left;    Current Outpatient Prescriptions  Medication Sig Dispense Refill  . ALPRAZolam (XANAX) 0.5 MG tablet TAKE 1 TABLET EVERY 8 HOURS AS NEEDED. 30 tablet 5  . amLODipine (NORVASC) 5 MG tablet Take 1 tablet by mouth 2 (two) times daily.     Marland Kitchen aspirin (BABY ASPIRIN) 81 MG chewable tablet Chew 81 mg by mouth daily.      . calcium elemental as carbonate (PX ANTACID MAXIMUM STRENGTH) 400 MG tablet Chew by mouth.    . cilostazol (PLETAL) 100 MG tablet Take 100 mg by mouth 2 (two) times daily.      Marland Kitchen estradiol (  VIVELLE-DOT) 0.05 MG/24HR patch APPLY ONE PATCH TWICE A WEEK, CHANGE SITES AND REAPPLY AS DIRECTED. 4 patch 0  . estradiol (VIVELLE-DOT) 0.05 MG/24HR patch Place 1 patch (0.05 mg total) onto the skin 2 (two) times a week. 24 patch 0  . FLUoxetine (PROZAC) 40 MG capsule TAKE (1) CAPSULE DAILY. 90 capsule 1  . HYDROcodone-acetaminophen (NORCO) 10-325 MG per tablet Take 1-2 tablets by mouth every 6 (six) hours as needed for moderate pain. 60 tablet 0  . isosorbide mononitrate (IMDUR) 60 MG 24 hr tablet Take 60 mg by mouth daily.      . potassium chloride SA (K-DUR,KLOR-CON) 20 MEQ tablet TAKE 1 TABLET DAILY. 90 tablet 3  . PROAIR HFA 108 (90 BASE) MCG/ACT inhaler     . rosuvastatin (CRESTOR) 20 MG tablet Take 1 tablet (20 mg total) by mouth every other day. 90 tablet 3  . triamterene-hydrochlorothiazide (MAXZIDE-25) 37.5-25 MG per tablet Take 1 tablet by  mouth daily.     No current facility-administered medications for this visit.    Family History  Problem Relation Age of Onset  . Stroke Mother   . Colon cancer Mother   . Heart attack Mother   . Migraines Mother   . Stroke Father   . Heart attack Father   . Esophageal cancer Neg Hx   . Stomach cancer Neg Hx   . Rectal cancer Neg Hx   . Diabetes Maternal Grandmother   . Gallbladder disease Father 61    septic gallbladder    ROS:  Pertinent items are noted in HPI.  Otherwise, a comprehensive ROS was negative.  Exam:   BP 110/66 mmHg  Pulse 90  Resp 16  Ht 5\' 1"  (1.549 m)  Wt 131 lb (59.421 kg)  BMI 24.76 kg/m2  LMP 03/31/1977      Height: 5\' 1"  (154.9 cm)  Ht Readings from Last 3 Encounters:  04/07/14 5\' 1"  (1.549 m)  10/10/13 5\' 1"  (1.549 m)  04/26/13 5' 1.25" (1.556 m)    General appearance: alert, cooperative and appears stated age Head: Normocephalic, without obvious abnormality, atraumatic Neck: no adenopathy, supple, symmetrical, trachea midline and thyroid normal to inspection and palpation Lungs: clear to auscultation bilaterally Breasts: Inspection negative, No nipple retraction or dimpling, No nipple discharge or bleeding, No axillary or supraclavicular adenopathy, 1 cm mass right breast 9:00, 1.5 cm mass left breast 9:00 Heart: regular rate and rhythm Abdomen: soft, non-tender; bowel sounds normal; no masses,  no organomegaly Extremities: extremities normal, atraumatic, no cyanosis or edema Skin: Skin color, texture, turgor normal. No rashes or lesions Lymph nodes: Cervical, supraclavicular, and axillary nodes normal. No abnormal inguinal nodes palpated Neurologic: Grossly normal   Pelvic: External genitalia:  no lesions              Urethra:  normal appearing urethra with no masses, tenderness or lesions              Bartholins and Skenes: normal                 Vagina: normal appearing vagina with normal color and discharge, no lesions               Cervix: absent              Pap taken: No. Bimanual Exam:  Uterus:  uterus absent              Adnexa: normal adnexa and no mass, fullness, tenderness  Rectovaginal: Confirms               Anus:  normal sphincter tone, no lesions  Chaperone was present for exam.  A:  [facility] Woman visit.  Bilateral breast masses.  ERT patient.  Osteopenia.   P:   Diagnostic bilateral mammogram and ultrasound at [facility].  Refill of Vivelle Dot 0.05 mg twice weekly. 3 month supply, 3 refills.  Discussed risks of breast CA, MI, stroke, DVT/PE. pap smear not indicated.  Weight bearing exercise, Ca/Vit D. Repeat bone density next year.  return annually or prn

## 2014-04-07 NOTE — Progress Notes (Signed)
Patient is scheduled for 3D Bilateral Breast Diagnostic Mammogram and bilateral  Breast Ultrasound at [facility] on 04/18/14 at 0930 . Patient agreeable to time/date/location.

## 2014-04-18 ENCOUNTER — Other Ambulatory Visit: Payer: Self-pay | Admitting: Obstetrics and Gynecology

## 2014-04-18 ENCOUNTER — Ambulatory Visit
Admission: RE | Admit: 2014-04-18 | Discharge: 2014-04-18 | Disposition: A | Discharge status: Home / Self Care | Payer: Medicare Other | Source: Ambulatory Visit | Attending: Obstetrics and Gynecology | Admitting: Obstetrics and Gynecology

## 2014-04-18 DIAGNOSIS — N6310 Unspecified lump in the right breast, unspecified quadrant: Secondary | ICD-10-CM

## 2014-04-18 DIAGNOSIS — N6320 Unspecified lump in the left breast, unspecified quadrant: Secondary | ICD-10-CM

## 2014-05-09 ENCOUNTER — Telehealth: Payer: Self-pay | Admitting: Emergency Medicine

## 2014-05-09 NOTE — Telephone Encounter (Signed)
OK to remove from mammogram hold.  Thanks for double checking on their recommendations.

## 2014-05-09 NOTE — Telephone Encounter (Signed)
Out of hold per Dr. Silva   

## 2014-05-09 NOTE — Telephone Encounter (Signed)
-----   Message from [place], MD sent at 04/18/2014  6:19 PM EST ----- Please confirm the recommendations regarding the patient's next mammogram being a screening study in one year? She was diagnosed with bilateral cysts.  Just want to be sure that is what they want and not a diagnostic study.

## 2014-05-09 NOTE — Telephone Encounter (Addendum)
Dr. Quincy Simmonds,  I spoke with Dr. Nyoka Cowden, a radiologist at [facility] who confirms screening mammogram in one year. She states that cysts appear to be simple in nature and with no evidence of malignancy.   Okay to remove from hold?

## 2014-05-30 ENCOUNTER — Telehealth: Payer: Self-pay | Admitting: Internal Medicine

## 2014-05-30 NOTE — Telephone Encounter (Signed)
Ok to RF x 1

## 2014-05-30 NOTE — Telephone Encounter (Signed)
Pt requesting refill of HYDROcodone-acetaminophen (NORCO) 10-325 MG per tablet

## 2014-05-31 MED ORDER — HYDROCODONE-ACETAMINOPHEN 10-325 MG PO TABS: 1.0000 | ORAL_TABLET | Freq: Four times a day (QID) | ORAL | Status: DC | PRN

## 2014-05-31 NOTE — Telephone Encounter (Signed)
rx will be ready for p/u, this afternoon, pt aware

## 2014-06-26 ENCOUNTER — Telehealth: Payer: Self-pay | Admitting: Internal Medicine

## 2014-06-26 NOTE — Telephone Encounter (Signed)
Ok to RF x 3 

## 2014-06-26 NOTE — Telephone Encounter (Signed)
Pt request refill of the following: ALPRAZolam Duanne Moron) 0.5 MG tablet   Phamacy: [facility]

## 2014-06-27 MED ORDER — ALPRAZOLAM 0.5 MG PO TABS: ORAL_TABLET | ORAL | Status: DC

## 2014-06-27 NOTE — Addendum Note (Signed)
Addended by: Townsend Roger D on: 06/27/2014 09:47 AM   Modules accepted: Orders

## 2014-06-27 NOTE — Telephone Encounter (Signed)
rx called in to [facility]

## 2014-07-17 ENCOUNTER — Other Ambulatory Visit: Payer: Self-pay | Admitting: *Deleted

## 2014-07-17 MED ORDER — ROSUVASTATIN CALCIUM 20 MG PO TABS: 20.0000 mg | ORAL_TABLET | ORAL | Status: DC

## 2014-08-03 ENCOUNTER — Other Ambulatory Visit (HOSPITAL_COMMUNITY): Payer: Self-pay | Admitting: Interventional Radiology

## 2014-08-03 DIAGNOSIS — I83811 Varicose veins of right lower extremities with pain: Secondary | ICD-10-CM

## 2014-08-23 ENCOUNTER — Other Ambulatory Visit: Payer: Self-pay | Admitting: Internal Medicine

## 2014-08-30 ENCOUNTER — Other Ambulatory Visit: Payer: Self-pay | Admitting: *Deleted

## 2014-09-12 ENCOUNTER — Other Ambulatory Visit: Payer: Medicare Other

## 2014-09-12 ENCOUNTER — Ambulatory Visit: Payer: Medicare Other

## 2014-09-25 ENCOUNTER — Other Ambulatory Visit: Payer: Self-pay

## 2014-09-29 ENCOUNTER — Telehealth: Payer: Self-pay | Admitting: Internal Medicine

## 2014-09-29 HISTORY — PX: CATARACT EXTRACTION: SUR2

## 2014-09-29 MED ORDER — HYDROCODONE-ACETAMINOPHEN 10-325 MG PO TABS: 1.0000 | ORAL_TABLET | Freq: Four times a day (QID) | ORAL | Status: DC | PRN

## 2014-09-29 NOTE — Telephone Encounter (Signed)
Would you sign for this?

## 2014-09-29 NOTE — Telephone Encounter (Signed)
ok 

## 2014-09-29 NOTE — Telephone Encounter (Signed)
Pt request refill of the following: HYDROcodone-acetaminophen (NORCO) 10-325 MG per tablet ° ° °Phamacy: ° °

## 2014-09-29 NOTE — Telephone Encounter (Signed)
rx up front ready for p/u, pt aware 

## 2014-10-26 ENCOUNTER — Ambulatory Visit
Admission: RE | Admit: 2014-10-26 | Discharge: 2014-10-26 | Disposition: A | Discharge status: Home / Self Care | Payer: Medicare Other | Source: Ambulatory Visit | Attending: Interventional Radiology | Admitting: Interventional Radiology

## 2014-10-26 ENCOUNTER — Ambulatory Visit: Admission: RE | Admit: 2014-10-26 | Payer: Medicare Other | Source: Ambulatory Visit

## 2014-10-26 DIAGNOSIS — I83811 Varicose veins of right lower extremities with pain: Secondary | ICD-10-CM | POA: Insufficient documentation

## 2014-10-26 NOTE — Progress Notes (Signed)
Chief Complaint: Patient was seen in consultation today for No chief complaint on file.  at the request of [place]  Referring Physician(s): McCullough,Heath  History of Present Illness: Kari Welch is a 69 y.o. 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 She has now undergone percutaneous ablation of both great saphenous veins as well as the right anterior accessory great saphenous vein. She presents today for her 6 month follow-up evaluation following ablation of the right anterior accessory great saphenous vein.  She is pleased to report that she is doing extremely well. Her legs feel great, she has no significant residual symptoms. There has been no interval development of troublesome venous varicosities or other problem areas. She is very pleased with her result.  Past Medical History  Diagnosis Date  . Hypertension   . Migraine   . Vasculitis   . Hyperlipidemia   . Allergy     seasonal  . Arthritis   . Venous insufficiency     superficial left leg- seeing a vein specialist in August  . PVD (peripheral vascular disease)     takes Pletal to open blood flow of blood to fingers, toes, and lower extremities  . Raynaud's disease   . Abnormal Pap smear     h/o  . Collagen vascular disease   . Internal hemorrhoids   . Hyperplastic colon polyp   . Elevated LFTs     Work up negative - Dr. Olevia Perches    Past Surgical History  Procedure Laterality Date  . Dilation and curettage of uterus    . Mass excision Left     hand  . Sympthectomy Left 2008    hand ([facility])  . Colonoscopy    . Abdominal hysterectomy  1979    TAH/LSO  . Pelvic laparoscopy      d/t infertility  . Cervix lesion destruction  1970  . Laser ablation Bilateral     leg-left 8/14, right 12/14  . Right bartholin's gland cyst excision Right     1990s  . Ablation saphenous vein w/ rfa    . Colonoscopy    .  Bunionectomy with hammertoe reconstruction Left 10/13/2013    Procedure: LEFT FIRST METATARSAL SCARF OSTEOTOMY;  LEFT MODIFIED MCBRIDE BUNIONECTOMY AND SECOND HAMMERTOE CORRECTION;  Surgeon: Wylene Simmer, MD;  Location: [facility];  Service: Orthopedics;  Laterality: Left;    Allergies: Prednisone  Medications: Prior to Admission medications   Medication Sig Start Date End Date Taking? Authorizing Provider  ALPRAZolam Duanne Moron) 0.5 MG tablet TAKE 1 TABLET EVERY 8 HOURS AS NEEDED. 06/27/14   Doe-Hyun R Shawna Orleans, DO  amLODipine (NORVASC) 5 MG tablet Take 1 tablet by mouth 2 (two) times daily.  12/12/11   Historical Provider, MD  aspirin (BABY ASPIRIN) 81 MG chewable tablet Chew 81 mg by mouth daily.      Historical Provider, MD  calcium elemental as carbonate (PX ANTACID MAXIMUM STRENGTH) 400 MG tablet Chew by mouth.    Historical Provider, MD  cilostazol (PLETAL) 100 MG tablet Take 100 mg by mouth 2 (two) times daily.      Historical Provider, MD  estradiol (VIVELLE-DOT) 0.05 MG/24HR patch Place 1 patch (0.05 mg total) onto the skin 2 (two) times a week. 04/07/14   Brook Oletta Lamas, MD  FLUoxetine (PROZAC) 40 MG capsule TAKE (1) CAPSULE DAILY. 08/24/14   Doe-Hyun R Shawna Orleans, DO  HYDROcodone-acetaminophen (NORCO) 10-325 MG per tablet  Take 1-2 tablets by mouth every 6 (six) hours as needed for moderate pain. 09/29/14   Marletta Lor, MD  isosorbide mononitrate (IMDUR) 60 MG 24 hr tablet Take 60 mg by mouth daily.      Historical Provider, MD  potassium chloride SA (K-DUR,KLOR-CON) 20 MEQ tablet TAKE 1 TABLET DAILY. 01/05/14   Doe-Hyun R Shawna Orleans, DO  PROAIR HFA 108 (90 BASE) MCG/ACT inhaler  08/20/12   Historical Provider, MD  rosuvastatin (CRESTOR) 20 MG tablet Take 1 tablet (20 mg total) by mouth every other day. 07/17/14   Doe-Hyun R Shawna Orleans, DO  triamterene-hydrochlorothiazide (MAXZIDE-25) 37.5-25 MG per tablet Take 1 tablet by mouth daily. 12/12/11   Historical Provider, MD     Family History    Problem Relation Age of Onset  . Stroke Mother   . Colon cancer Mother   . Heart attack Mother   . Migraines Mother   . Stroke Father   . Heart attack Father   . Esophageal cancer Neg Hx   . Stomach cancer Neg Hx   . Rectal cancer Neg Hx   . Diabetes Maternal Grandmother   . Gallbladder disease Father 63    septic gallbladder    History   Social History  . Marital Status: Married    Spouse Name: N/A  . Number of Children: N/A  . Years of Education: N/A   Social History Main Topics  . Smoking status: Former Smoker -- 0.50 packs/day for 18 years    Types: Cigarettes    Quit date: 10/10/1988  . Smokeless tobacco: Never Used  . Alcohol Use: 0.0 oz/week    0 Standard drinks or equivalent per week     Comment: occ glass of wine  . Drug Use: No  . Sexual Activity:    Partners: Male    Birth Control/ Protection: Surgical     Comment: TAH   Other Topics Concern  . Not on file   Social History Narrative   Married 28 years   3 adopted children     Review of Systems: A 12 point ROS discussed and pertinent positives are indicated in the HPI above.  All other systems are negative.  Review of Systems  Vital Signs: LMP 03/31/1977  Physical Exam  Constitutional: She is oriented to person, place, and time. She appears well-developed and well-nourished. No distress.  Eyes: No scleral icterus.  Cardiovascular: Normal rate.   Pulmonary/Chest: Effort normal.  Neurological: She is alert and oriented to person, place, and time.  Skin: Skin is warm and dry.      Imaging: US Venous Img Lower Unilateral Right  10/26/2014   CLINICAL DATA:  69 year old female 6 month status post endo venous laser ablation of anterior accessory right great saphenous vein.  EXAM: RIGHT LOWER EXTREMITY VENOUS DUPLEX ULTRASOUND  TECHNIQUE: Gray-scale sonography with graded compression, as well as color Doppler and duplex ultrasound, were performed to evaluate the deep and superficial veins of both  lower extremities. Spectral Doppler was utilized to evaluate flow at rest and with distal augmentation maneuvers. A complete superficial venous insufficiency exam was performed in the upright standing position. I personally performed the technical portion of the exam.  COMPARISON:  MOST RECENT PRIOR VENOUS ULTRASOUND 04/05/2014  FINDINGS: Deep Venous System:  Evaluation of the deep venous system including the common femoral, femoral, profunda femoral, popliteal and calf veins (where visible) demonstrate no evidence of deep venous thrombosis. The vessels are compressible and demonstrate normal respiratory phasicity and response to  augmentation. No evidence of deep venous reflux.  Superficial Venous System:  Imaging of the anterior accessory great saphenous vein demonstrates continued occlusion. No evidence of recanalization. There is no residual significant superficial venous varicosity either.  IMPRESSION: 1. Continued successful occlusion of right anterior accessory great saphenous vein. 2. No residual superficial venous varicosities. 3. No evidence of deep venous thrombosis.  Signed,  Criselda Peaches, MD  [facility]  [facility]   Electronically Signed   By: Jacqulynn Cadet M.D.   On: 10/26/2014 16:48    Labs:  CBC:  Recent Labs  11/29/13 1553  WBC 6.3  HGB 14.5  HCT 43.3  PLT 415.0*    COAGS: No results for input(s): INR, APTT in the last 8760 hours.  BMP:  Recent Labs  11/29/13 1553  NA 136  K 3.7  CL 101  CO2 26  GLUCOSE 74  BUN 17  CALCIUM 9.5  CREATININE 1.1    LIVER FUNCTION TESTS:  Recent Labs  11/29/13 1553  BILITOT 0.7  AST 41*  ALT 40*  ALKPHOS 55  PROT 7.9  ALBUMIN 3.8    TUMOR MARKERS: No results for input(s): AFPTM, CEA, CA199, CHROMGRNA in the last 8760 hours.  Assessment and Plan:  Doing extremely well status post endovenous laser ablation of the right anterior accessory great saphenous vein.  She is currently completely asymptomatic and doing well. She no longer needs to wear her compression hose.  Kari Welch will call if she develops any recurrent symptoms in the future.   SignedJacqulynn Cadet 10/26/2014, 6:09 PM   I spent a total of   10 Minutes in face to face in clinical consultation, greater than 50% of which was counseling/coordinating care for chronic venous insufficiency.

## 2014-11-14 ENCOUNTER — Other Ambulatory Visit: Payer: Self-pay | Admitting: Internal Medicine

## 2015-01-08 ENCOUNTER — Other Ambulatory Visit: Payer: Self-pay | Admitting: Internal Medicine

## 2015-01-10 ENCOUNTER — Telehealth: Payer: Self-pay | Admitting: Internal Medicine

## 2015-01-10 DIAGNOSIS — E785 Hyperlipidemia, unspecified: Secondary | ICD-10-CM

## 2015-01-10 DIAGNOSIS — I10 Essential (primary) hypertension: Secondary | ICD-10-CM

## 2015-01-10 NOTE — Telephone Encounter (Signed)
Pt would like to have blood work drawn to check whatever dr Shawna Orleans recommends. What labs  can I sch pt for ?

## 2015-01-15 NOTE — Telephone Encounter (Signed)
I suggest CPX labs but she needs appt with another provider as I am not sure when I will return to work

## 2015-01-16 NOTE — Telephone Encounter (Signed)
Labs ordered please schedule patient with a provider for a physical

## 2015-01-17 NOTE — Telephone Encounter (Signed)
Left message on voicemail for pt to call back

## 2015-01-18 NOTE — Telephone Encounter (Signed)
Pt has been sch for cpx labs only. Pt has gyn .

## 2015-01-31 ENCOUNTER — Telehealth: Payer: Self-pay | Admitting: Internal Medicine

## 2015-01-31 NOTE — Telephone Encounter (Signed)
Pt needs new rx hydrocodone °

## 2015-02-01 NOTE — Telephone Encounter (Signed)
She needs appt with another provider for refill

## 2015-02-02 MED ORDER — HYDROCODONE-ACETAMINOPHEN 10-325 MG PO TABS: 1.0000 | ORAL_TABLET | Freq: Four times a day (QID) | ORAL | Status: DC | PRN

## 2015-02-02 NOTE — Telephone Encounter (Signed)
[place] for RF x 1 until her next appt

## 2015-02-02 NOTE — Addendum Note (Signed)
Addended by: Westley Hummer B on: 02/02/2015 03:24 PM   Modules accepted: Orders

## 2015-02-02 NOTE — Telephone Encounter (Signed)
Left message on machine for patient to call and schedule an appointment

## 2015-02-02 NOTE — Telephone Encounter (Signed)
Rx ready for pick up and patient is aware 

## 2015-02-02 NOTE — Telephone Encounter (Signed)
Pt is calling to let md know she has an appt on 03/02/15 with him.

## 2015-02-05 ENCOUNTER — Other Ambulatory Visit: Payer: Self-pay | Admitting: Internal Medicine

## 2015-02-12 ENCOUNTER — Other Ambulatory Visit: Payer: Self-pay | Admitting: Internal Medicine

## 2015-02-20 ENCOUNTER — Other Ambulatory Visit: Payer: Self-pay | Admitting: Internal Medicine

## 2015-02-26 ENCOUNTER — Other Ambulatory Visit (INDEPENDENT_AMBULATORY_CARE_PROVIDER_SITE_OTHER): Payer: Medicare Other

## 2015-02-26 DIAGNOSIS — E785 Hyperlipidemia, unspecified: Secondary | ICD-10-CM

## 2015-02-26 DIAGNOSIS — I10 Essential (primary) hypertension: Secondary | ICD-10-CM

## 2015-02-26 DIAGNOSIS — Z23 Encounter for immunization: Secondary | ICD-10-CM

## 2015-02-26 LAB — TSH: TSH: 4.34 u[IU]/mL (ref 0.35–4.50)

## 2015-02-26 LAB — POCT URINALYSIS DIPSTICK
Bilirubin, UA: NEGATIVE
Blood, UA: NEGATIVE
GLUCOSE UA: NEGATIVE
KETONES UA: NEGATIVE
Leukocytes, UA: NEGATIVE
Nitrite, UA: NEGATIVE
Protein, UA: NEGATIVE
Spec Grav, UA: 1.02
UROBILINOGEN UA: 0.2
pH, UA: 5.5

## 2015-02-26 LAB — CBC WITH DIFFERENTIAL/PLATELET
BASOS ABS: 0 10*3/uL (ref 0.0–0.1)
BASOS PCT: 0.7 % (ref 0.0–3.0)
EOS ABS: 0.2 10*3/uL (ref 0.0–0.7)
Eosinophils Relative: 2.8 % (ref 0.0–5.0)
HEMATOCRIT: 44.1 % (ref 36.0–46.0)
HEMOGLOBIN: 14.7 g/dL (ref 12.0–15.0)
LYMPHS PCT: 42.5 % (ref 12.0–46.0)
Lymphs Abs: 2.5 10*3/uL (ref 0.7–4.0)
MCHC: 33.3 g/dL (ref 30.0–36.0)
MCV: 105.2 fl — ABNORMAL HIGH (ref 78.0–100.0)
MONO ABS: 0.8 10*3/uL (ref 0.1–1.0)
Monocytes Relative: 12.7 % — ABNORMAL HIGH (ref 3.0–12.0)
Neutro Abs: 2.4 10*3/uL (ref 1.4–7.7)
Neutrophils Relative %: 41.3 % — ABNORMAL LOW (ref 43.0–77.0)
Platelets: 303 10*3/uL (ref 150.0–400.0)
RBC: 4.19 Mil/uL (ref 3.87–5.11)
RDW: 14.1 % (ref 11.5–15.5)
WBC: 5.9 10*3/uL (ref 4.0–10.5)

## 2015-02-26 LAB — HEPATIC FUNCTION PANEL
ALT: 60 U/L — AB (ref 0–35)
AST: 37 U/L (ref 0–37)
Albumin: 3.9 g/dL (ref 3.5–5.2)
Alkaline Phosphatase: 53 U/L (ref 39–117)
BILIRUBIN DIRECT: 0.1 mg/dL (ref 0.0–0.3)
TOTAL PROTEIN: 7.2 g/dL (ref 6.0–8.3)
Total Bilirubin: 0.5 mg/dL (ref 0.2–1.2)

## 2015-02-26 LAB — LIPID PANEL
CHOLESTEROL: 197 mg/dL (ref 0–200)
HDL: 73.5 mg/dL (ref >39.00)
LDL Cholesterol: 108 mg/dL — ABNORMAL HIGH (ref 0–99)
NonHDL: 123.53
TRIGLYCERIDES: 77 mg/dL (ref 0.0–149.0)
Total CHOL/HDL Ratio: 3
VLDL: 15.4 mg/dL (ref 0.0–40.0)

## 2015-02-26 LAB — BASIC METABOLIC PANEL
BUN: 18 mg/dL (ref 6–23)
CO2: 27 mEq/L (ref 19–32)
Calcium: 9.6 mg/dL (ref 8.4–10.5)
Chloride: 102 mEq/L (ref 96–112)
Creatinine, Ser: 1.08 mg/dL (ref 0.40–1.20)
GFR: 53.77 mL/min — ABNORMAL LOW (ref >60.00)
Glucose, Bld: 95 mg/dL (ref 70–99)
POTASSIUM: 3.7 meq/L (ref 3.5–5.1)
Sodium: 139 mEq/L (ref 135–145)

## 2015-02-27 ENCOUNTER — Other Ambulatory Visit: Payer: Self-pay

## 2015-02-27 DIAGNOSIS — Z1231 Encounter for screening mammogram for malignant neoplasm of breast: Secondary | ICD-10-CM

## 2015-03-02 ENCOUNTER — Ambulatory Visit: Payer: Medicare Other | Admitting: Internal Medicine

## 2015-04-01 HISTORY — PX: OTHER SURGICAL HISTORY: SHX169

## 2015-04-20 ENCOUNTER — Ambulatory Visit
Admission: RE | Admit: 2015-04-20 | Discharge: 2015-04-20 | Disposition: A | Discharge status: Home / Self Care | Payer: Medicare Other | Source: Ambulatory Visit

## 2015-04-20 DIAGNOSIS — Z1231 Encounter for screening mammogram for malignant neoplasm of breast: Secondary | ICD-10-CM

## 2015-04-23 ENCOUNTER — Other Ambulatory Visit: Payer: Self-pay | Admitting: Obstetrics and Gynecology

## 2015-04-23 ENCOUNTER — Ambulatory Visit: Payer: Medicare Other | Admitting: Obstetrics and Gynecology

## 2015-04-23 ENCOUNTER — Encounter: Payer: Self-pay | Admitting: Obstetrics and Gynecology

## 2015-04-23 ENCOUNTER — Ambulatory Visit (INDEPENDENT_AMBULATORY_CARE_PROVIDER_SITE_OTHER): Payer: Medicare Other | Admitting: Obstetrics and Gynecology

## 2015-04-23 VITALS — BP 100/70 | HR 90 | Resp 22 | Ht 61.0 in | Wt 136.4 lb

## 2015-04-23 DIAGNOSIS — M8580 Other specified disorders of bone density and structure, unspecified site: Secondary | ICD-10-CM

## 2015-04-23 DIAGNOSIS — Z01419 Encounter for gynecological examination (general) (routine) without abnormal findings: Secondary | ICD-10-CM

## 2015-04-23 DIAGNOSIS — R928 Other abnormal and inconclusive findings on diagnostic imaging of breast: Secondary | ICD-10-CM

## 2015-04-23 MED ORDER — ESTRADIOL 0.0375 MG/24HR TD PTTW: 1.0000 | MEDICATED_PATCH | TRANSDERMAL | Status: DC

## 2015-04-23 NOTE — Patient Instructions (Addendum)
EXERCISE AND DIET:  We recommended that you start or continue a regular exercise program for good health. Regular exercise means any activity that makes your heart beat faster and makes you sweat.  We recommend exercising at least 30 minutes per day at least 3 days a week, preferably 4 or 5.  We also recommend a diet low in fat and sugar.  Inactivity, poor dietary choices and obesity can cause diabetes, heart attack, stroke, and kidney damage, among others.    ALCOHOL AND SMOKING:  Women should limit their alcohol intake to no more than 7 drinks/beers/glasses of wine (combined, not each!) per week. Moderation of alcohol intake to this level decreases your risk of breast cancer and liver damage. And of course, no recreational drugs are part of a healthy lifestyle.  And absolutely no smoking or even second hand smoke. Most people know smoking can cause heart and lung diseases, but did you know it also contributes to weakening of your bones? Aging of your skin?  Yellowing of your teeth and nails?  CALCIUM AND VITAMIN D:  Adequate intake of calcium and Vitamin D are recommended.  The recommendations for exact amounts of these supplements seem to change often, but generally speaking 600 mg of calcium (either carbonate or citrate) and 800 units of Vitamin D per day seems prudent. Certain women may benefit from higher intake of Vitamin D.  If you are among these women, your doctor will have told you during your visit.    PAP SMEARS:  Pap smears, to check for cervical cancer or precancers,  have traditionally been done yearly, although recent scientific advances have shown that most women can have pap smears less often.  However, every woman still should have a physical exam from her gynecologist every year. It will include a breast check, inspection of the vulva and vagina to check for abnormal growths or skin changes, a visual exam of the cervix, and then an exam to evaluate the size and shape of the uterus and  ovaries.  And after 68 years of age, a rectal exam is indicated to check for rectal cancers. We will also provide age appropriate advice regarding health maintenance, like when you should have certain vaccines, screening for sexually transmitted diseases, bone density testing, colonoscopy, mammograms, etc.   MAMMOGRAMS:  All women over 40 years old should have a yearly mammogram. Many facilities now offer a "3D" mammogram, which may cost around $50 extra out of pocket. If possible,  we recommend you accept the option to have the 3D mammogram performed.  It both reduces the number of women who will be called back for extra views which then turn out to be normal, and it is better than the routine mammogram at detecting truly abnormal areas.    COLONOSCOPY:  Colonoscopy to screen for colon cancer is recommended for all women at age 50.  We know, you hate the idea of the prep.  We agree, BUT, having colon cancer and not knowing it is worse!!  Colon cancer so often starts as a polyp that can be seen and removed at colonscopy, which can quite literally save your life!  And if your first colonoscopy is normal and you have no family history of colon cancer, most women don't have to have it again for 10 years.  Once every ten years, you can do something that may end up saving your life, right?  We will be happy to help you get it scheduled when you are ready.    Be sure to check your insurance coverage so you understand how much it will cost.  It may be covered as a preventative service at no cost, but you should check your particular policy.     You are scheduled for a Bone Density Test at [facility] for 05/17/15 arrival for appointment at 2:10. Please bring your insurance card and photo ID.  To prepare for the test, please wear loose fitting clothing with no metal zippers or belt buckles below the waist. Please do not take any calcium supplements the day before and the day of your exam.   If you need to cancel or reschedule please call them directly at [phone]. They are located at [address] [place]. Please let us know if we can be of further assistance.

## 2015-04-23 NOTE — Progress Notes (Signed)
Patient ID: Kari Welch, female   DOB: 08-30-1945, 70 y.o.   MRN: [id number] 69 y.o. G0P0 Married Caucasian female here for annual exam.    Patient is on estrogen patch.  Feels like it is not lasting as long.  Feels some night sweats after the fourth day.   Hx of vasculitis.  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 History of ulceration of fingertips. History of phlebitis. Has had saphenous vein surgery.  Had a skin reaction to [place] patch. Off Vivelle last year and switched to Minivelle 0.05 mg.   PCP:   Mikeal Hawthorne, MD.  Has appointment on 04/25/15.   Patient's last menstrual period was 03/31/1977.          Sexually active: Yes.   female The current method of family planning is status post hysterectomy.    Exercising: Yes.    walking Smoker:  Former, quit [place] Maintenance: Pap:  06-18-00 normal History of abnormal Pap:  Yes, Hx of colposcopy and cryotherapy to cervix in 1970's. MMG:  04-20-15 3D/density cat.C/Lt.breast possible mass;Rt.breast negative--[facility] to contact pt. To schedule further evaluation--needs Diag.mmg and possible U/S of Lt.Breast. Colonoscopy:  11-03-12 normal with Dr. Sydell Axon Brodie;next due 10/2017 due to hx of polyps. BMD:   04-27-13   Result  Osteopenia of hip:[facility].  T score -1.8 of the hip.  TDaP:  2014 Screening Labs:  Hb today: PCP, Urine today: PCP   reports that she quit smoking about 26 years ago. Her smoking use included Cigarettes. She has a 9 pack-year smoking history. She has never used smokeless tobacco. She reports that she drinks about 1.2 oz of alcohol per week. She reports that she does not use illicit drugs.  Past Medical History  Diagnosis Date  . Hypertension   . Migraine   . Vasculitis ([facility])   . Hyperlipidemia   . Allergy     seasonal  . Arthritis   . Venous insufficiency     superficial left leg- seeing a vein specialist in August  . PVD (peripheral vascular disease) ([facility])      takes Pletal to open blood flow of blood to fingers, toes, and lower extremities  . Raynaud's disease   . Abnormal Pap smear     h/o  . Collagen vascular disease ([facility])   . Internal hemorrhoids   . Hyperplastic colon polyp   . Elevated LFTs     Work up negative - Dr. Olevia Perches    Past Surgical History  Procedure Laterality Date  . Dilation and curettage of uterus    . Mass excision Left     hand  . Sympthectomy Left 2008    hand ([facility])  . Colonoscopy    . Abdominal hysterectomy  1979    TAH/LSO  . Pelvic laparoscopy      d/t infertility  . Cervix lesion destruction  1970  . Laser ablation Bilateral     leg-left 8/14, right 12/14  . Right bartholin's gland cyst excision Right     1990s  . Ablation saphenous vein w/ rfa    . Colonoscopy    . Bunionectomy with hammertoe reconstruction Left 10/13/2013    Procedure: LEFT FIRST METATARSAL SCARF OSTEOTOMY;  LEFT MODIFIED MCBRIDE BUNIONECTOMY AND SECOND HAMMERTOE CORRECTION;  Surgeon: Wylene Simmer, MD;  Location: [facility];  Service: Orthopedics;  Laterality: Left;  . Cataract extraction Bilateral 09/2014    Dr. Talbert Forest    Current Outpatient Prescriptions  Medication Sig Dispense Refill  . ALPRAZolam (XANAX) 0.5 MG tablet TAKE 1 TABLET EVERY 8 HOURS AS NEEDED. 30 tablet 3  . amLODipine (NORVASC) 5 MG tablet Take 1 tablet by mouth 2 (two) times daily.     Marland Kitchen aspirin (BABY ASPIRIN) 81 MG chewable tablet Chew 81 mg by mouth daily.      . calcium elemental as carbonate (PX ANTACID MAXIMUM STRENGTH) 400 MG tablet Chew by mouth.    . cilostazol (PLETAL) 100 MG tablet Take 100 mg by mouth 2 (two) times daily.      Marland Kitchen estradiol (VIVELLE-DOT) 0.05 MG/24HR patch Place 1 patch (0.05 mg total) onto the skin 2 (two) times a week. 24 patch 3  . FLUoxetine (PROZAC) 40 MG capsule TAKE (1) CAPSULE DAILY. 90 capsule 0  . HYDROcodone-acetaminophen (NORCO) 10-325 MG tablet Take 1-2 tablets by mouth every 6 (six) hours as needed for moderate  pain. 60 tablet 0  . isosorbide mononitrate (IMDUR) 60 MG 24 hr tablet Take 60 mg by mouth daily.      . potassium chloride SA (K-DUR,KLOR-CON) 20 MEQ tablet TAKE 1 TABLET DAILY. 30 tablet 0  . PROAIR HFA 108 (90 BASE) MCG/ACT inhaler every 6 (six) hours as needed.     . rosuvastatin (CRESTOR) 10 MG tablet Take 1 tablet by mouth daily.    Marland Kitchen triamterene-hydrochlorothiazide (MAXZIDE-25) 37.5-25 MG per tablet Take 1 tablet by mouth daily.     No current facility-administered medications for this visit.    Family History  Problem Relation Age of Onset  . Stroke Mother   . Colon cancer Mother 65    dec with colon ca  . Heart attack Mother   . Migraines Mother   . Stroke Father   . Heart attack Father   . Gallbladder disease Father 41    septic gallbladder  . Esophageal cancer Neg Hx   . Stomach cancer Neg Hx   . Rectal cancer Neg Hx   . Diabetes Maternal Grandmother   . Rheum arthritis Brother     dec age 32 ?Rheumatoid arthritis    ROS:  Pertinent items are noted in HPI.  Otherwise, a comprehensive ROS was negative.  Exam:   BP 100/70 mmHg  Pulse 90  Resp 22  Ht 5\' 1"  (1.549 m)  Wt 136 lb 6.4 oz (61.871 kg)  BMI 25.79 kg/m2  LMP 03/31/1977    General appearance: alert, cooperative and appears stated age Head: Normocephalic, without obvious abnormality, atraumatic Neck: no adenopathy, supple, symmetrical, trachea midline and thyroid normal to inspection and palpation Lungs: clear to auscultation bilaterally Breasts: normal appearance, no masses or tenderness, Inspection negative, No nipple retraction or dimpling, No nipple discharge or bleeding, No axillary or supraclavicular adenopathy Heart: regular rate and rhythm.  Occasional PVC vs PAC. Abdomen: soft, non-tender; bowel sounds normal; no masses,  no organomegaly Extremities: extremities normal, atraumatic, no cyanosis or edema Skin: Skin color, texture, turgor normal.  Large raise verrucous mole of right mid lateral  back. Lymph nodes: Cervical, supraclavicular, and axillary nodes normal. No abnormal inguinal nodes palpated Neurologic: Grossly normal  Pelvic: External genitalia:   Multiple cysts of the right labia majora - probable sebaceous cysts.              Urethra:  normal appearing urethra with no masses, tenderness or lesions              Bartholins and Skenes: normal  Vagina: normal appearing vagina with normal color and discharge, no lesions              Cervix: absent              Pap taken: No. Bimanual Exam:  Uterus:  uterus absent              Adnexa: normal adnexa and no mass, fullness, tenderness              Rectovaginal: Yes.  .  Confirms.              Anus:  normal sphincter tone, no lesions  Chaperone was present for exam.  Assessment:   Well woman visit with normal exam. Status post TAH/LSO. Recent abnormal mammogram.  Needs diagnostic mammogram and left breast ultrasound. ERT patient.  Hx of peripheral vascular disease. Osteopenia.  Sebaceous cysts of the vulva.  I do not recommend excision. Seborrheic keratosis likely of back.   Plan: Yearly mammogram recommended after age 28.  Will schedule diagnostic mammogram and left breast ultrasound.  Will also schedule bone density.  Recommended self breast exam.  Pap and HR HPV as above. Discussed Calcium, Vitamin D, regular exercise program including cardiovascular and weight bearing exercise. Labs performed.  No..   See orders. Refills given on medications.  Yes.  .  See orders.  Will reduce Minivelle to 0.0375 mg twice daily.  I discussed WHI study showing increased risk of DVT, PE, and stroke. I did also discuss benefits of preventing osteoporosis.  I would like to help patient to wean off her ERT. Follow up with dermatology for excision of nevus.  [facility].  Follow up annually and prn.      After visit summary provided.

## 2015-04-23 NOTE — Progress Notes (Signed)
Patient is scheduled for L Breast Diagnostic Mammogram and L Breast Ultrasound at [facility] on 04/26/15 at 1340 . Patient agreeable to time/date/location. Bone Density testing scheduled for 05/17/15 at 1410 arrival.

## 2015-04-25 ENCOUNTER — Ambulatory Visit: Payer: Medicare Other | Admitting: Internal Medicine

## 2015-04-26 ENCOUNTER — Ambulatory Visit
Admission: RE | Admit: 2015-04-26 | Discharge: 2015-04-26 | Disposition: A | Discharge status: Home / Self Care | Payer: Medicare Other | Source: Ambulatory Visit | Attending: Obstetrics and Gynecology | Admitting: Obstetrics and Gynecology

## 2015-04-26 DIAGNOSIS — R928 Other abnormal and inconclusive findings on diagnostic imaging of breast: Secondary | ICD-10-CM

## 2015-05-02 ENCOUNTER — Ambulatory Visit (INDEPENDENT_AMBULATORY_CARE_PROVIDER_SITE_OTHER): Payer: Medicare Other | Admitting: Family Medicine

## 2015-05-02 ENCOUNTER — Encounter: Payer: Self-pay | Admitting: Family Medicine

## 2015-05-02 VITALS — BP 100/70 | HR 85 | Temp 97.5°F | Wt 135.0 lb

## 2015-05-02 DIAGNOSIS — I999 Unspecified disorder of circulatory system: Secondary | ICD-10-CM | POA: Diagnosis not present

## 2015-05-02 DIAGNOSIS — D6859 Other primary thrombophilia: Secondary | ICD-10-CM

## 2015-05-02 DIAGNOSIS — I10 Essential (primary) hypertension: Secondary | ICD-10-CM | POA: Diagnosis not present

## 2015-05-02 DIAGNOSIS — R7401 Elevation of levels of liver transaminase levels: Secondary | ICD-10-CM | POA: Insufficient documentation

## 2015-05-02 DIAGNOSIS — F4322 Adjustment disorder with anxiety: Secondary | ICD-10-CM

## 2015-05-02 DIAGNOSIS — D7589 Other specified diseases of blood and blood-forming organs: Secondary | ICD-10-CM

## 2015-05-02 DIAGNOSIS — E785 Hyperlipidemia, unspecified: Secondary | ICD-10-CM | POA: Diagnosis not present

## 2015-05-02 DIAGNOSIS — Z23 Encounter for immunization: Secondary | ICD-10-CM | POA: Diagnosis not present

## 2015-05-02 DIAGNOSIS — I83811 Varicose veins of right lower extremities with pain: Secondary | ICD-10-CM

## 2015-05-02 DIAGNOSIS — M549 Dorsalgia, unspecified: Secondary | ICD-10-CM

## 2015-05-02 DIAGNOSIS — R740 Nonspecific elevation of levels of transaminase and lactic acid dehydrogenase [LDH]: Secondary | ICD-10-CM

## 2015-05-02 DIAGNOSIS — G8929 Other chronic pain: Secondary | ICD-10-CM

## 2015-05-02 LAB — HEPATIC FUNCTION PANEL
ALBUMIN: 4.1 g/dL (ref 3.5–5.2)
ALT: 47 U/L — AB (ref 0–35)
AST: 46 U/L — ABNORMAL HIGH (ref 0–37)
Alkaline Phosphatase: 54 U/L (ref 39–117)
BILIRUBIN TOTAL: 0.4 mg/dL (ref 0.2–1.2)
Bilirubin, Direct: 0.1 mg/dL (ref 0.0–0.3)
Total Protein: 8 g/dL (ref 6.0–8.3)

## 2015-05-02 LAB — LDL CHOLESTEROL, DIRECT: LDL DIRECT: 61 mg/dL

## 2015-05-02 MED ORDER — HYDROCODONE-ACETAMINOPHEN 10-325 MG PO TABS: 1.0000 | ORAL_TABLET | Freq: Four times a day (QID) | ORAL | Status: DC | PRN

## 2015-05-02 NOTE — Progress Notes (Signed)
Garret Reddish, MD  Subjective:  Kari Welch is a 70 y.o. year old very pleasant female patient who presents for/with See problem oriented charting ROS- No chest pain or shortness of breath. No headache or blurry vision. Denies recent hand or foot pain.   Past Medical History-  Patient Active Problem List   Diagnosis Date Noted  . Chronic back pain 11/29/2013    Priority: High  . Hypercoagulable state ([place]) 05/26/2012    Priority: High  . Paroxysmal digital cyanosis 05/26/2012    Priority: High  . Vasculopathy 11/29/2007    Priority: High  . Adjustment disorder with anxiety 11/29/2013    Priority: Medium  . Claudication ([place]) 05/26/2012    Priority: Medium  . Hyperlipidemia 02/04/2010    Priority: Medium  . Essential hypertension 11/19/2006    Priority: Medium  . Varicose veins of right lower extremity with pain     Priority: Low  . Macrocytosis without anemia 11/29/2013    Priority: Low    Medications- reviewed and updated Current Outpatient Prescriptions  Medication Sig Dispense Refill  . amLODipine (NORVASC) 5 MG tablet Take 1 tablet by mouth 2 (two) times daily.     Marland Kitchen aspirin (BABY ASPIRIN) 81 MG chewable tablet Chew 81 mg by mouth daily.      . cilostazol (PLETAL) 100 MG tablet Take 100 mg by mouth 2 (two) times daily.      Marland Kitchen estradiol (MINIVELLE) 0.0375 MG/24HR Place 1 patch onto the skin 2 (two) times a week. 12 patch 2  . FLUoxetine (PROZAC) 40 MG capsule TAKE (1) CAPSULE DAILY. 90 capsule 0  . isosorbide mononitrate (IMDUR) 60 MG 24 hr tablet Take 60 mg by mouth daily.      . potassium chloride SA (K-DUR,KLOR-CON) 20 MEQ tablet TAKE 1 TABLET DAILY. 30 tablet 0  . PROAIR HFA 108 (90 BASE) MCG/ACT inhaler every 6 (six) hours as needed.     . rosuvastatin (CRESTOR) 10 MG tablet Take 1 tablet by mouth daily.    Marland Kitchen triamterene-hydrochlorothiazide (MAXZIDE-25) 37.5-25 MG per tablet Take 1 tablet by mouth daily.    Marland Kitchen ALPRAZolam (XANAX) 0.5 MG tablet TAKE 1 TABLET EVERY  8 HOURS AS NEEDED. (Patient not taking: Reported on 05/02/2015) 30 tablet 3  . HYDROcodone-acetaminophen (NORCO) 10-325 MG tablet Take 1 tablet by mouth every 6 (six) hours as needed for moderate pain. 60 tablet 0  . HYDROcodone-acetaminophen (NORCO) 10-325 MG tablet Take 1 tablet by mouth every 6 (six) hours as needed (may refill in 30 days). 60 tablet 0   No current facility-administered medications for this visit.    Objective: BP 100/70 mmHg  Pulse 85  Temp(Src) 97.5 F (36.4 C)  Wt 135 lb (61.236 kg)  LMP 03/31/1977 Gen: NAD, resting comfortably Mild perioral dermatitis under care of dermatology CV: RRR no murmurs rubs or gallops Lungs: CTAB no crackles, wheeze, rhonchi Abdomen: soft/nontender/nondistended/normal bowel sounds. No rebound or guarding.  Ext: no edema MSK: Scars Left hand and wrist extensive some callus build up on tips of fingers. No cyanosis Skin: warm, dry Neuro: grossly normal, moves all extremities  Assessment/Plan:  Vasculopathy S:Followed at [facility] Dr. Lianne Moris. Follows each December. Peripheral disease. Has had spasm in arteries in hand- 9 days in hospital and surgery as a result. Has had claudication as well but was venous- had ablation laser of greater saphenous veins- toes were going numb and that resolved issues. Takes Imdur, amlodipine, pletal, statin, asa- focus on vasodilation A/P: continue current medications as  well as [facility] follow up   Hypercoagulable state ([place]) S: Listed as having hypercoaguable state. Seems to be in relation to her vasculopathy- has not been on anticoagulants- just on antiplatelet aspirin at this point A/P: continue [facility] follow up as well as aspirin as other vasodilators as noted under vasculopathy   Chronic back pain S: Must avoid nsaids, triptans and many agents that would traditionally be used for pain as well as migraines due to vasculopathy and concern over vasoconstriction. She uses about 15-20 vicodin a month for  combination of low back pain with neuropathy as well as migraines (4-5 a month). She has been stable on this for several years. Was some consideration of low dose gabapentin A/P: We refilled #60 of hydrocodone x2  which at her frequency should last at least 6 months and we will follow up at that time. Consider gabapentin as previously noted by Dr. Shawna Orleans   Hyperlipidemia S: well controlled on crestor 10mg  every other day- recent increase from 5mg  every other day by [facility]. No myalgias.  Lab Results  Component Value Date   CHOL 197 02/26/2015   HDL 73.50 02/26/2015   LDLCALC 108* 02/26/2015   LDLDIRECT 61.0 05/02/2015   TRIG 77.0 02/26/2015   CHOLHDL 3 02/26/2015   A/P: continue current dose    Essential hypertension S: controlled. On  Amlodipine 5mg , imdur 60mg , maxzide 25mg . No orthostatic symptoms BP Readings from Last 3 Encounters:  05/02/15 100/70  04/23/15 100/70  04/07/14 110/66  A/P:Continue current meds:  Really somewhat overcontrolled but with vasculopathy history will nto alter unless symptomatic   6 months. Then yearly possibly- would want MD/DO to at least see every 6 months between [place] and [facility] though. Return precautions advised.   Orders Placed This Encounter  Procedures  . Pneumococcal polysaccharide vaccine 23-valent greater than or equal to 2yo subcutaneous/IM  . LDL cholesterol, direct      . Hepatic Function Panel    Meds ordered this encounter  Medications  . HYDROcodone-acetaminophen (NORCO) 10-325 MG tablet    Sig: Take 1 tablet by mouth every 6 (six) hours as needed for moderate pain.    Dispense:  60 tablet    Refill:  0  . HYDROcodone-acetaminophen (NORCO) 10-325 MG tablet    Sig: Take 1 tablet by mouth every 6 (six) hours as needed (may refill in 30 days).    Dispense:  60 tablet    Refill:  0

## 2015-05-02 NOTE — Assessment & Plan Note (Addendum)
S:Followed at [facility] Dr. Lianne Moris. Follows each December. Peripheral disease. Has had spasm in arteries in hand- 9 days in hospital and surgery as a result. Has had claudication as well but was venous- had ablation laser of greater saphenous veins- toes were going numb and that resolved issues. Takes Imdur, amlodipine, pletal, statin, asa- focus on vasodilation A/P: continue current medications as well as [facility] follow up

## 2015-05-02 NOTE — Patient Instructions (Addendum)
Final pneumonia shot received today (PNEUMOVAX).  Great to meet you  Refilled medicine for pain- call if need refill on xanax  Check in with me 6 months- I consider you to be established with me but just want to review other areas of history outside of problem list and check in with you

## 2015-05-02 NOTE — Assessment & Plan Note (Signed)
S: Must avoid nsaids, triptans and many agents that would traditionally be used for pain as well as migraines due to vasculopathy and concern over vasoconstriction. She uses about 15-20 vicodin a month for combination of low back pain with neuropathy as well as migraines (4-5 a month). She has been stable on this for several years. Was some consideration of low dose gabapentin A/P: We refilled #60 of hydrocodone x2  which at her frequency should last at least 6 months and we will follow up at that time. Consider gabapentin as previously noted by Dr. Shawna Orleans

## 2015-05-02 NOTE — Assessment & Plan Note (Signed)
S: well controlled on crestor 10mg  every other day- recent increase from 5mg  every other day by [facility]. No myalgias.  Lab Results  Component Value Date   CHOL 197 02/26/2015   HDL 73.50 02/26/2015   LDLCALC 108* 02/26/2015   LDLDIRECT 61.0 05/02/2015   TRIG 77.0 02/26/2015   CHOLHDL 3 02/26/2015   A/P: continue current dose

## 2015-05-02 NOTE — Assessment & Plan Note (Signed)
S: Listed as having hypercoaguable state. Seems to be in relation to her vasculopathy- has not been on anticoagulants- just on antiplatelet aspirin at this point A/P: continue [facility] follow up as well as aspirin as other vasodilators as noted under vasculopathy

## 2015-05-02 NOTE — Assessment & Plan Note (Signed)
S: controlled. On  Amlodipine 5mg , imdur 60mg , maxzide 25mg . No orthostatic symptoms BP Readings from Last 3 Encounters:  05/02/15 100/70  04/23/15 100/70  04/07/14 110/66  A/P:Continue current meds:  Really somewhat overcontrolled but with vasculopathy history will nto alter unless symptomatic

## 2015-05-10 DIAGNOSIS — L710 Perioral dermatitis: Secondary | ICD-10-CM | POA: Diagnosis not present

## 2015-05-17 ENCOUNTER — Ambulatory Visit
Admission: RE | Admit: 2015-05-17 | Discharge: 2015-05-17 | Disposition: A | Discharge status: Home / Self Care | Payer: Medicare Other | Source: Ambulatory Visit | Attending: Obstetrics and Gynecology | Admitting: Obstetrics and Gynecology

## 2015-05-17 DIAGNOSIS — M85852 Other specified disorders of bone density and structure, left thigh: Secondary | ICD-10-CM | POA: Diagnosis not present

## 2015-05-17 DIAGNOSIS — M8580 Other specified disorders of bone density and structure, unspecified site: Secondary | ICD-10-CM

## 2015-05-18 ENCOUNTER — Encounter: Payer: Self-pay | Admitting: Obstetrics and Gynecology

## 2015-06-07 IMAGING — US ULTRASOUND RADIOLOGIST EVALUATION AND MANAGEMENT
1 series · 13 of 16 positions shown · non-contrast
Comparison: none

[Series 1: ultrasound radiologist evaluation and management · 13 of 45 slices shown]
[im 1/45]
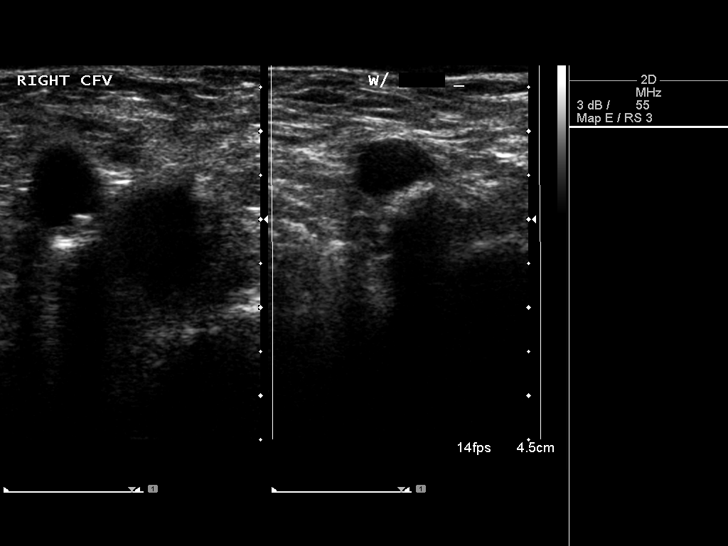
[im 3/45]
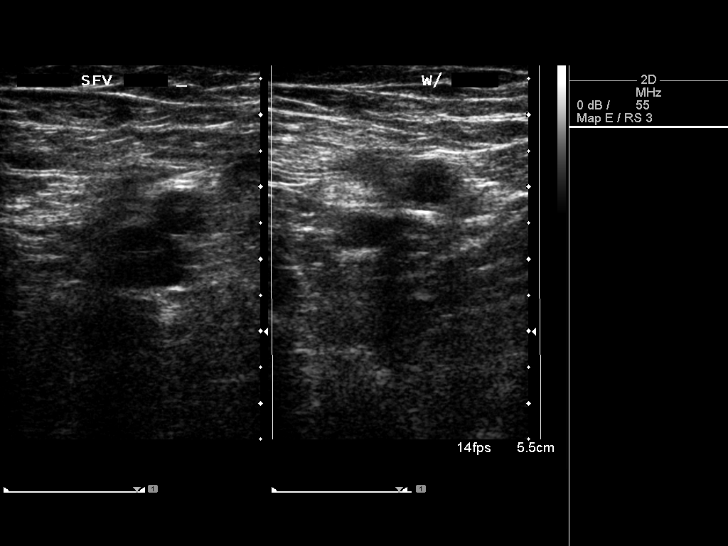
[im 9/45]
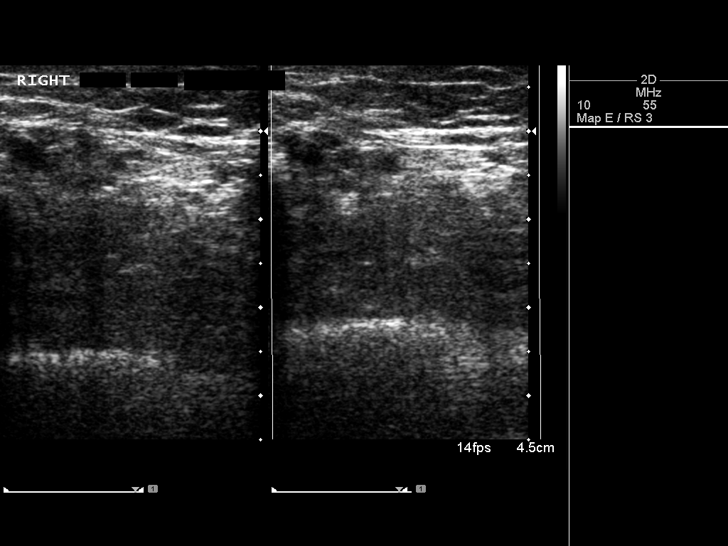
[im 12/45]
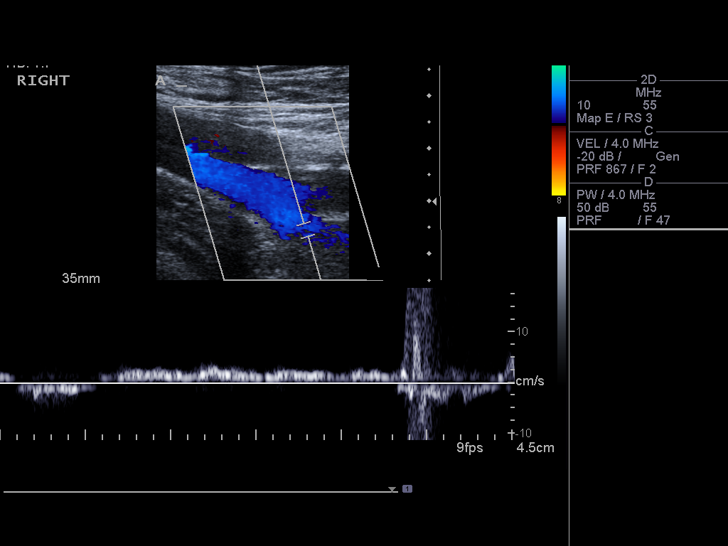
[im 15/45]
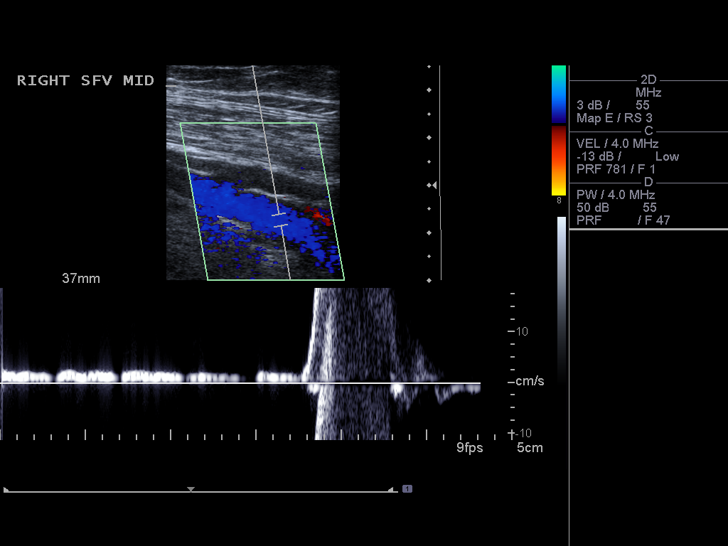
[im 18/45]
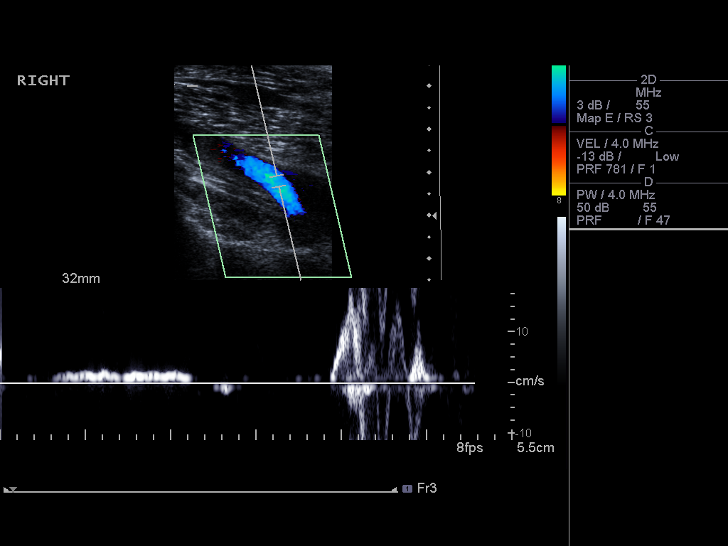
[im 24/45]
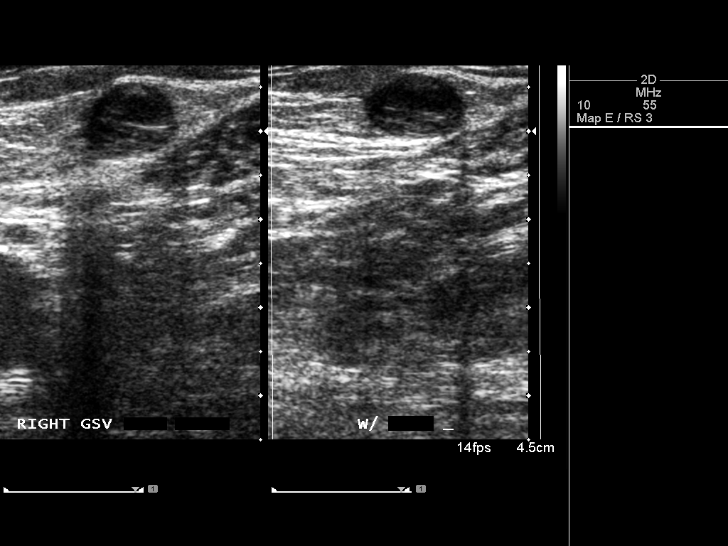
[im 27/45]
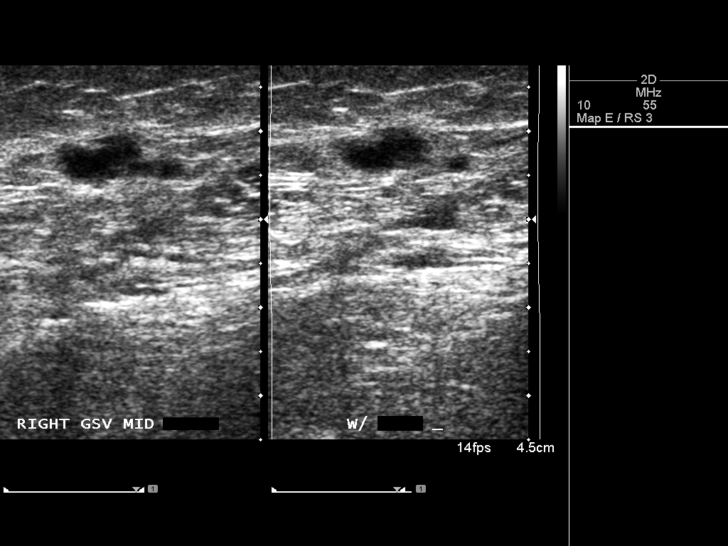
[im 30/45]
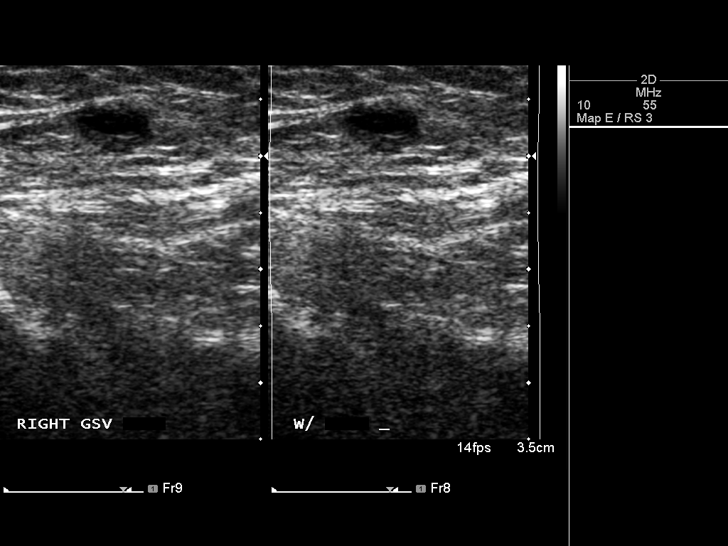
[im 33/45]
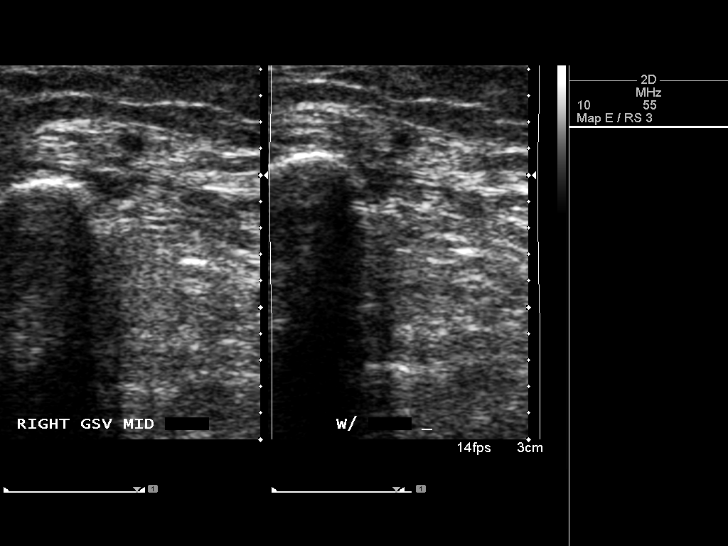
[im 36/45]
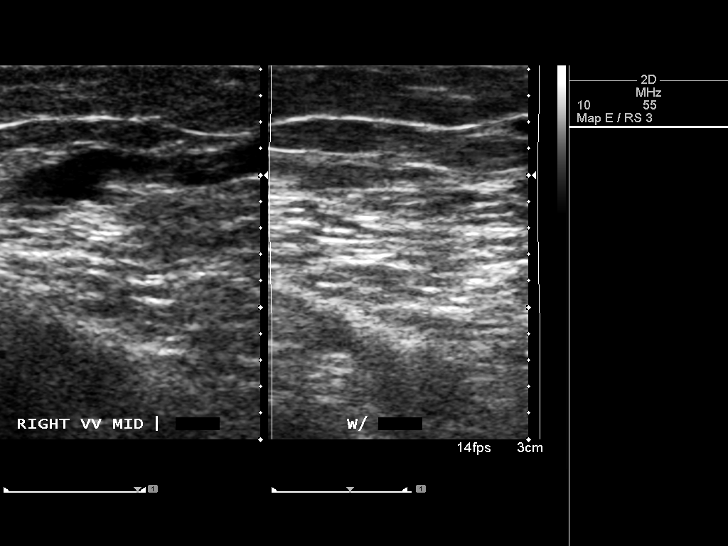
[im 42/45]
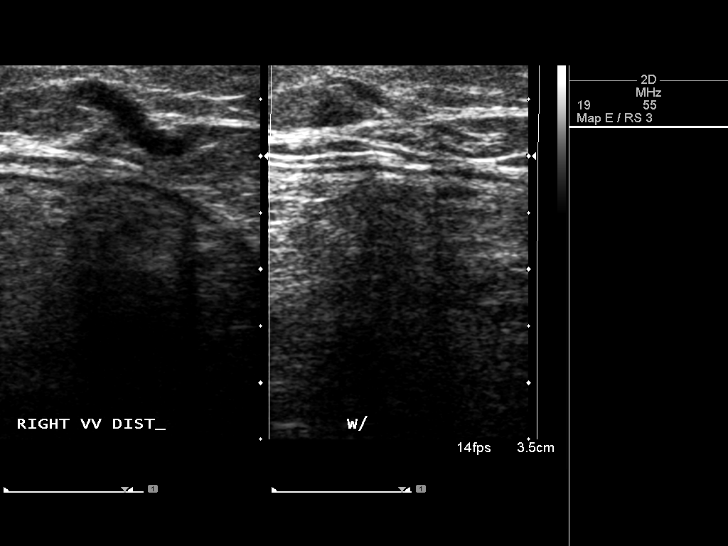
[im 45/45]
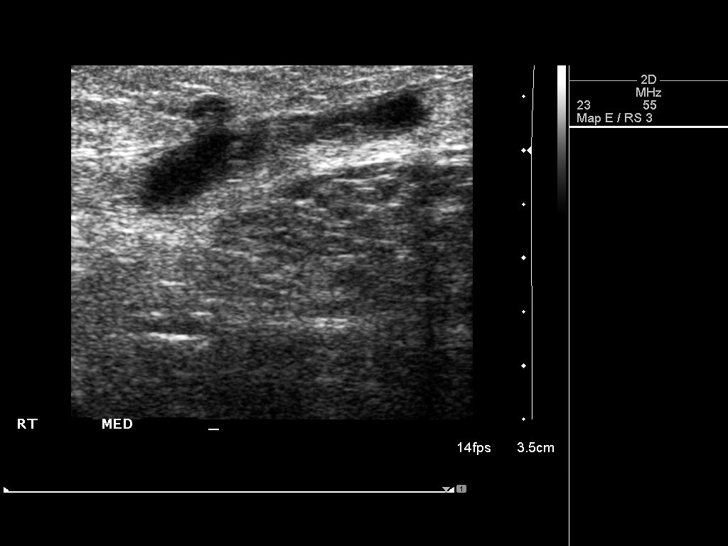

[13 of 16 positions shown; findings below may reference images not displayed]

EXAM:
Established Patient Outpatient Visit:  Level 2

HISTORY OF PRESENT ILLNESS:
Transcatheter laser occlusion to the right great saphenous vein on
03/02/2013. Patient has mild pain and bruising in the right lower
extremity but no significant problems. She says that the bruising is
decreasing. She does have focal tenderness in the medial right upper
thigh. She denies fevers or chills. She has been wearing in the
compression stockings on a regular basis.

CHIEF COMPLAINT:
One week follow-up after endovascular laser treatment to the right
great saphenous vein

PHYSICAL EXAMINATION:
Right lower extremity: The puncture site in the lower calf is
well-healed. There is ecchymosis in the medial aspect of the lower
thigh. No significant erythema. Patient is tender to palpation in
the medial upper thigh. No evidence for ulceration.

IMAGING: Negative for right lower extremity DVT. The treated great
saphenous vein in the calf and thigh is occluded. There is a small
duplicated segment of the great saphenous vein in the distal thigh
which is patent. Small patent varicosities in the upper calf. There
is an occluded branch coming off the medial aspect of the upper
thigh GSV which is tender to palpation.

ASSESSMENT AND PLAN:
Assessment: Doing well after transcatheter laser occlusion to the
right great saphenous vein. No evidence for DVT or infection. The
bruising is decreasing in the thigh and the patient is mildly tender
along an occluded branch in the medial thigh.

Patent varicosities in the upper calf and there is a patent
duplicated short segment of the great saphenous vein. These areas
could be treated in the future with ultrasound-guided sclerotherapy.

Plan: Continue to wear the compression stockings on a regular basis.
Patient will follow-up with Dr. Thomsen in 3-4 weeks.

## 2015-07-11 IMAGING — US US EXTREM LOW VENOUS*R*
1 series · 13 of 24 positions shown · non-contrast
Comparison: Prior right lower extremity duplex venous ultrasound
03/09/2013

CLINICAL DATA: One month follow-up status post right great
saphenous vein endovenous laser ablation

EXAM:
RIGHT LOWER EXTREMITY VENOUS DOPPLER ULTRASOUND
TECHNIQUE: Gray-scale sonography with graded compression, as well as color
Doppler and duplex ultrasound, were performed to evaluate the deep
venous system from the level of the common femoral vein through the
popliteal and proximal calf veins. Spectral Doppler was utilized to
evaluate flow at rest and with distal augmentation maneuvers.

[Series 1: us extrem low venous*right* · 13 of 36 slices shown]
[im 1/36]
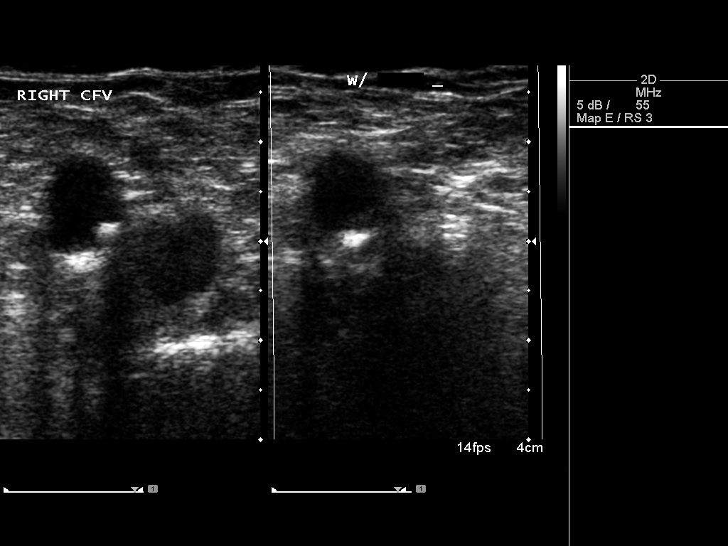
[im 4/36]
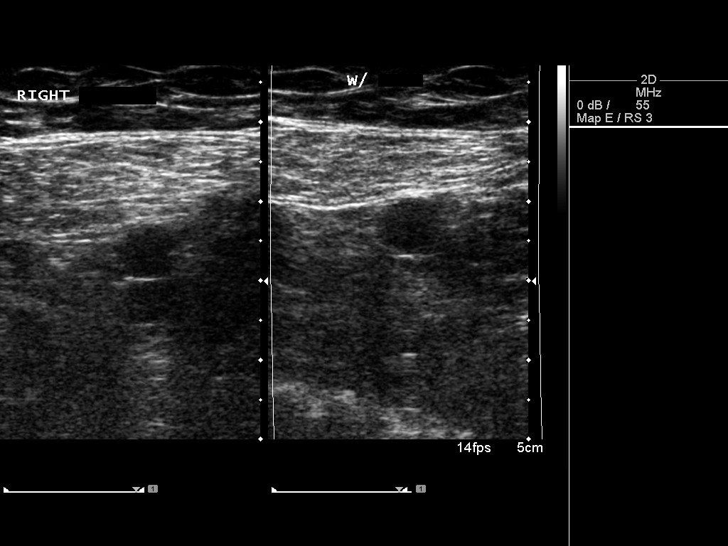
[im 7/36]
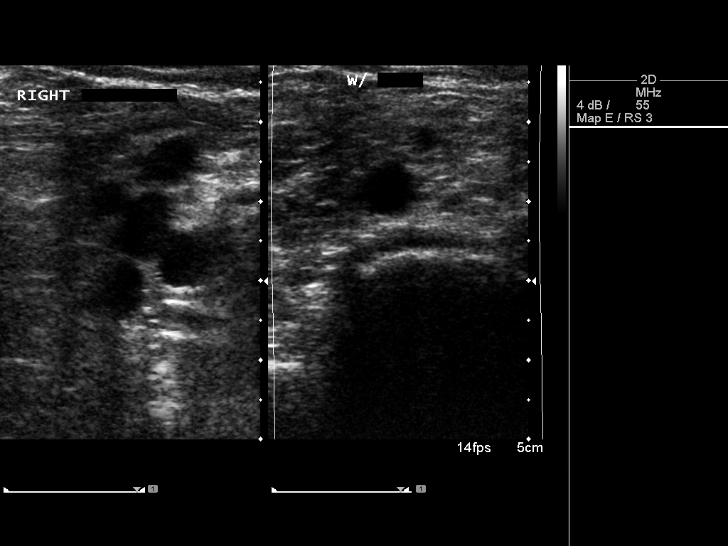
[im 10/36]
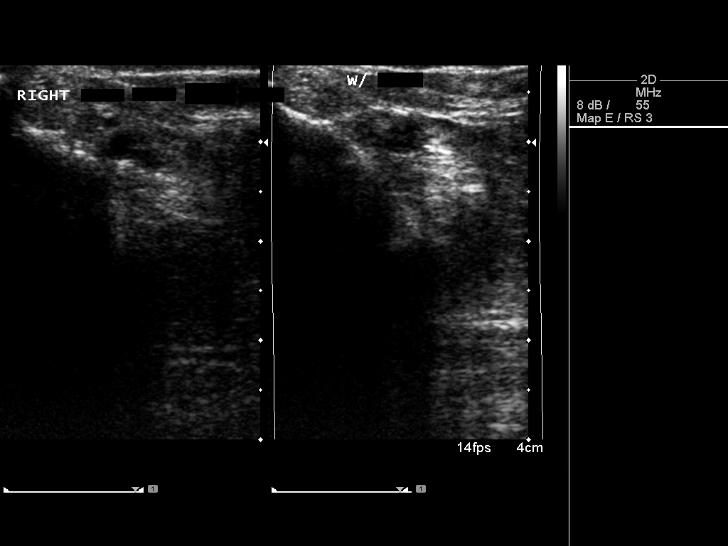
[im 13/36]
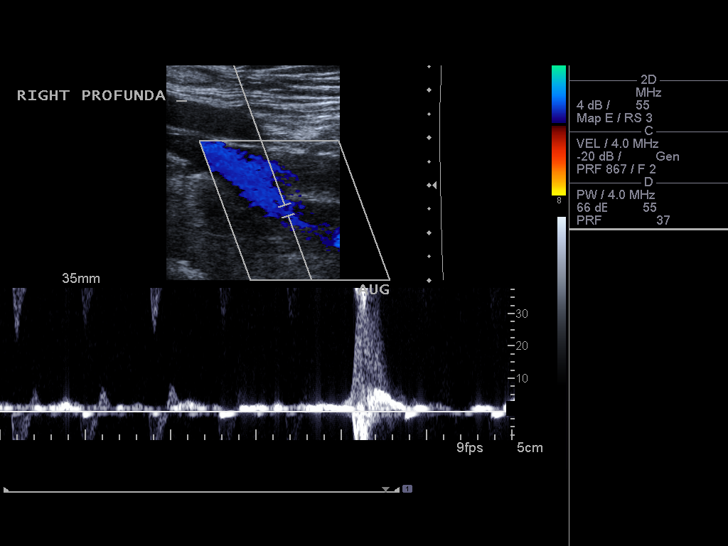
[im 16/36]
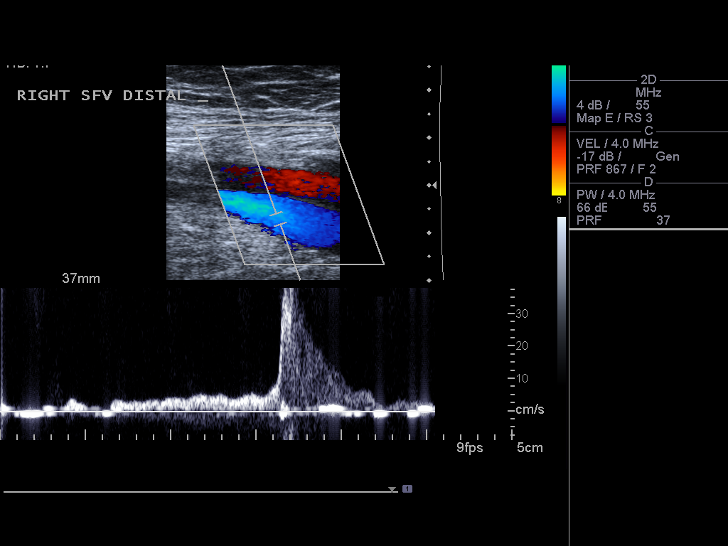
[im 19/36]
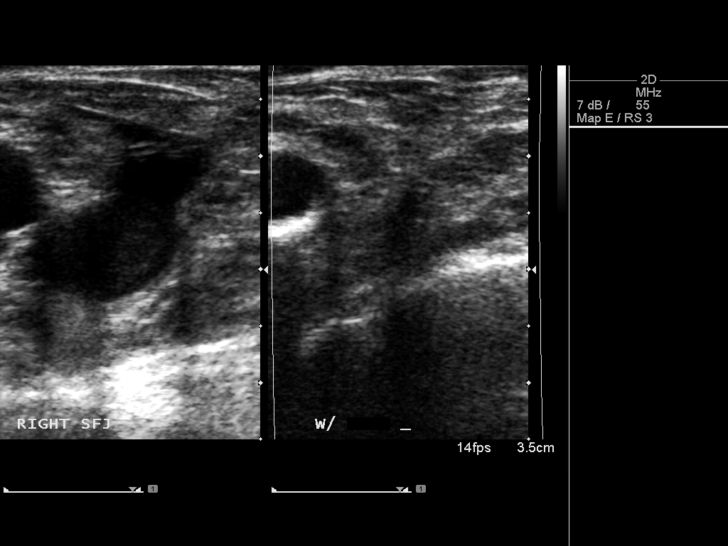
[im 20/36]
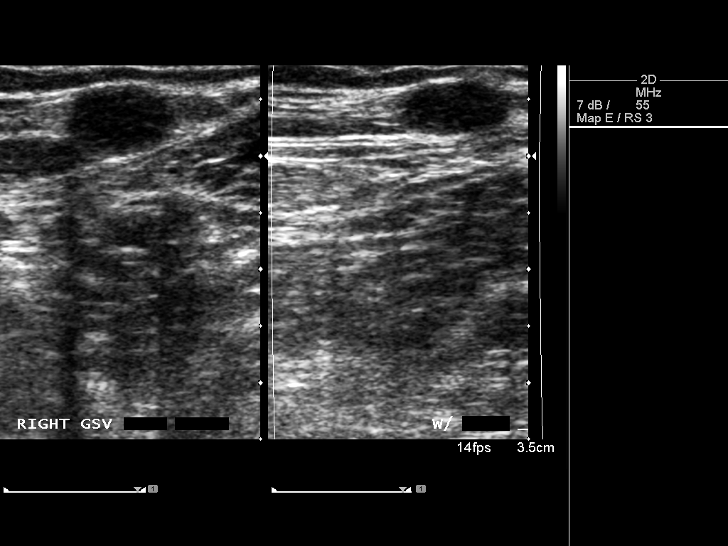
[im 23/36]
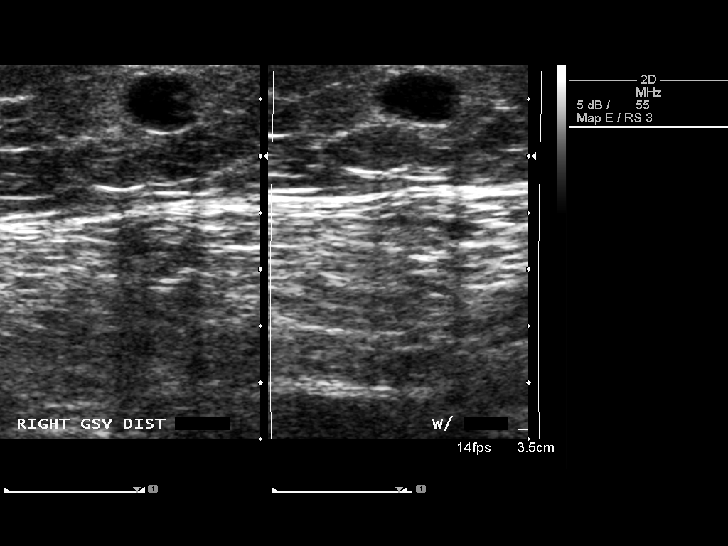
[im 26/36]
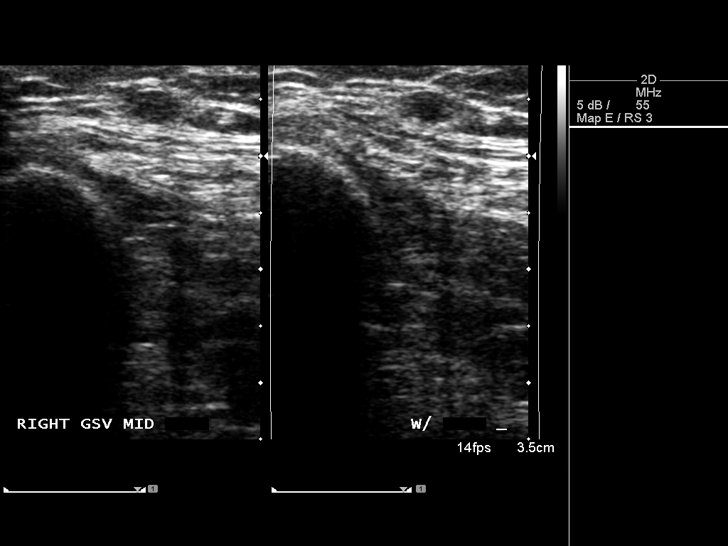
[im 29/36]
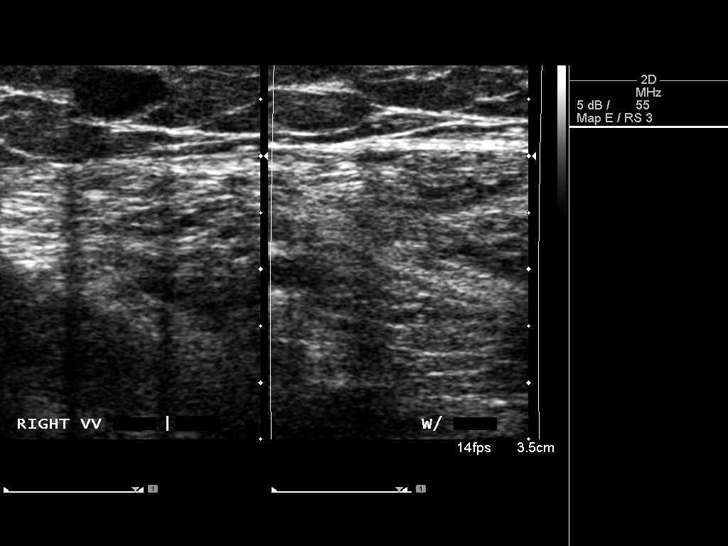
[im 32/36]
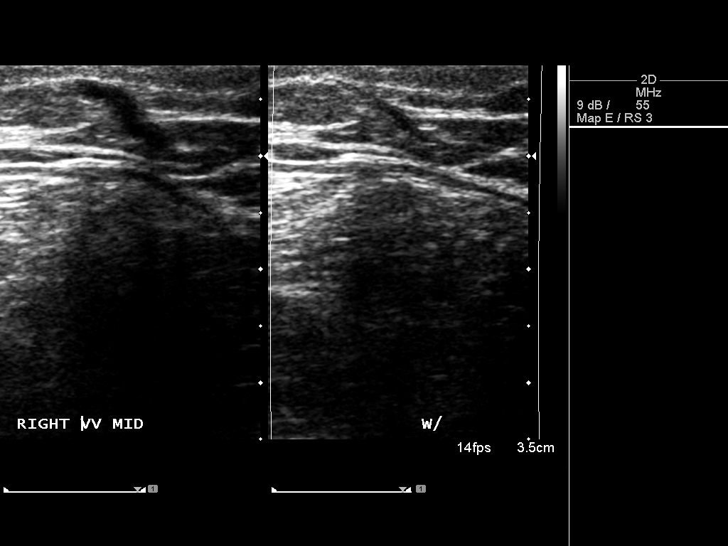
[im 36/36]
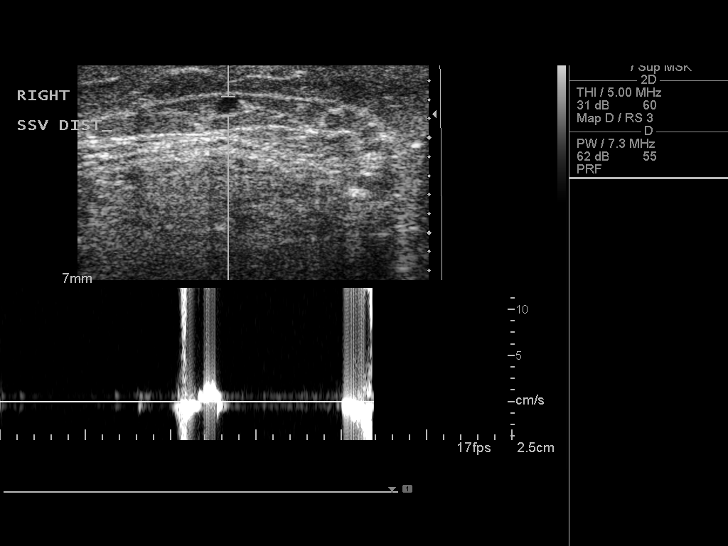

[13 of 24 positions shown; findings below may reference images not displayed]

FINDINGS: Thrombus within deep veins:  None

Compressibility of deep veins:  Normal.

Duplex waveform respiratory phasicity:  Normal.

Duplex waveform response to augmentation:  Normal.

Venous reflux: At least 3 seconds of reflux is identified within an
incompetent superficial venous varicosity extending from the
obliterated great saphenous vein just below the knee joint
posteriorly and medially across the calf where it joins the small
saphenous vein. The small saphenous vein is competent without
evidence of reflux.

Other findings: Single patent superficial venous varicosity
extending from the obliterated great saphenous vein just below the
knee joint posteriorly and medially across the calf.
IMPRESSION: 1. Negative for deep venous thrombosis.
2. Successful ablation of the great saphenous vein
3. Single patent superficial collateral venous varicosity extending
from just below the knee joint posteriorly and medially across the
calf where it joins the competent small saphenous vein.

## 2015-07-11 IMAGING — US US INJEC SCLEROTHERAPY SINGLE
1 series · 6 of 6 positions shown · non-contrast
Comparison: Contemporaneously obtained ultrasound evaluation of the
right lower extremity, and prior left lower extremity venous
ultrasound 01/13/2013

CLINICAL DATA: Persistent symptomatic varicose veins bilaterally
despite technically successful laser closure of the refluxing
greater saphenous veins.

EXAM:
ULTRASOUND INJECTION SCLEROTHERAPHY SINGLE RIGHT; ULTRASOUND
INJECTION SCLEROTHERAPHY SINGLE LEFT
TECHNIQUE: RIGHT LOWER EXTREMITY

[Series 1: us injec sclerotherapy single · 6 acquisitions, 6 frames shown]
[im 1/6]
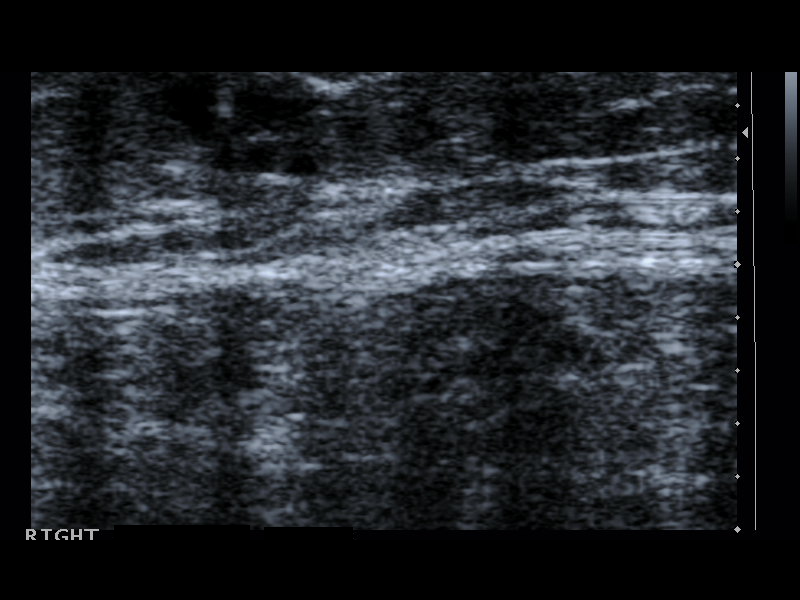
[im 2/6]
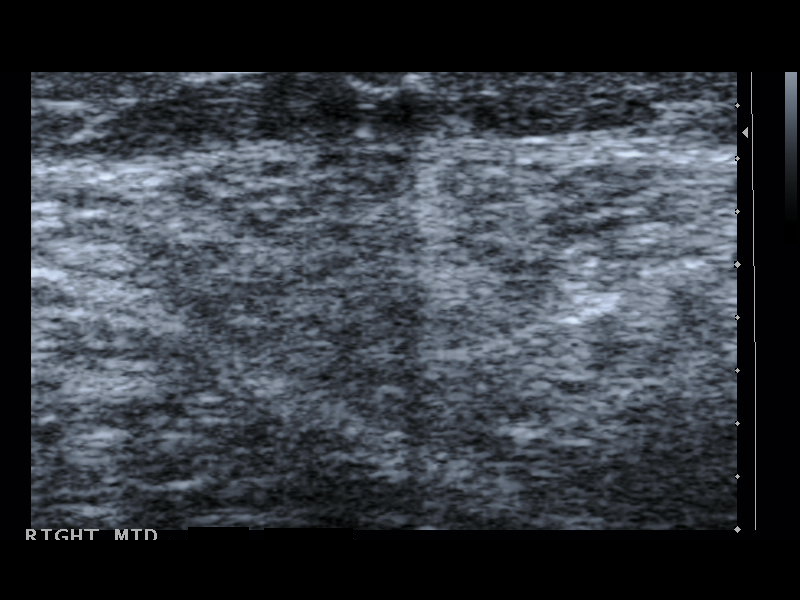
[im 3/6]
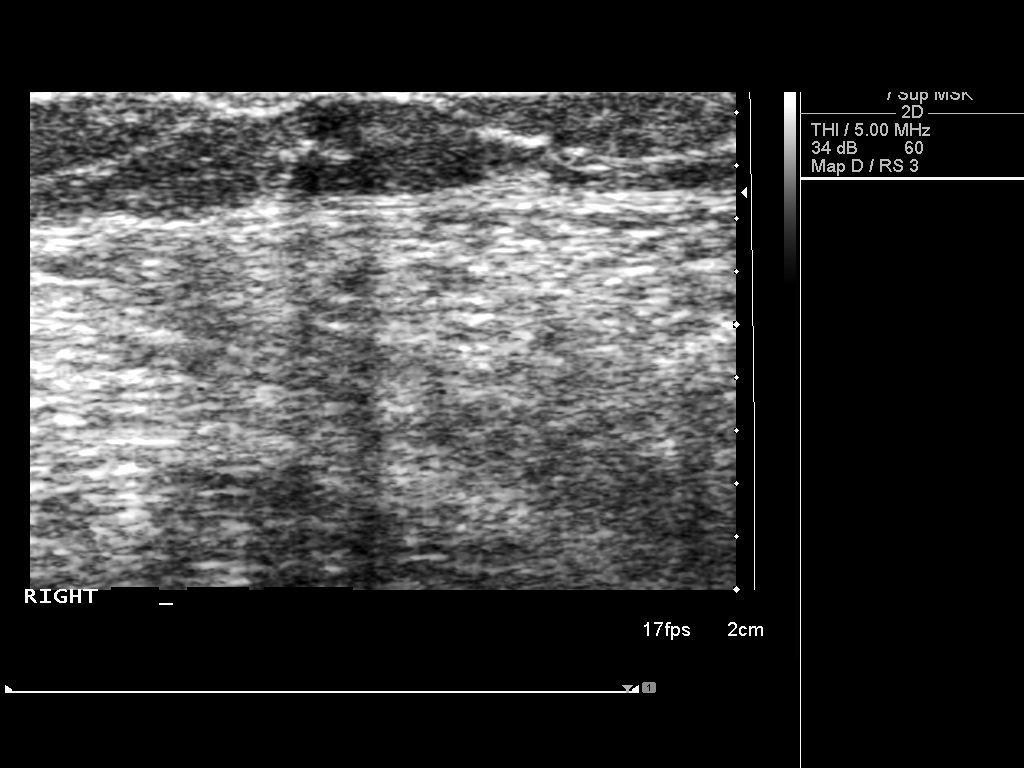
[im 4/6]
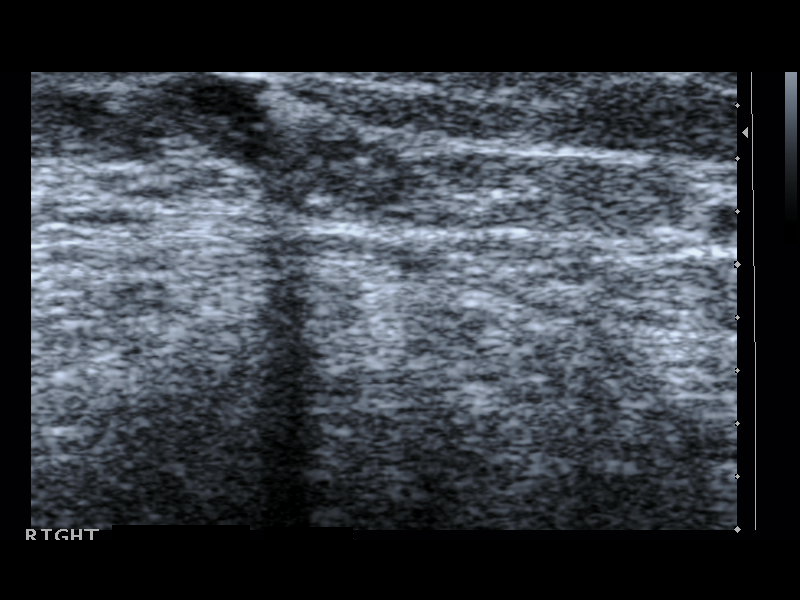
[im 5/6]
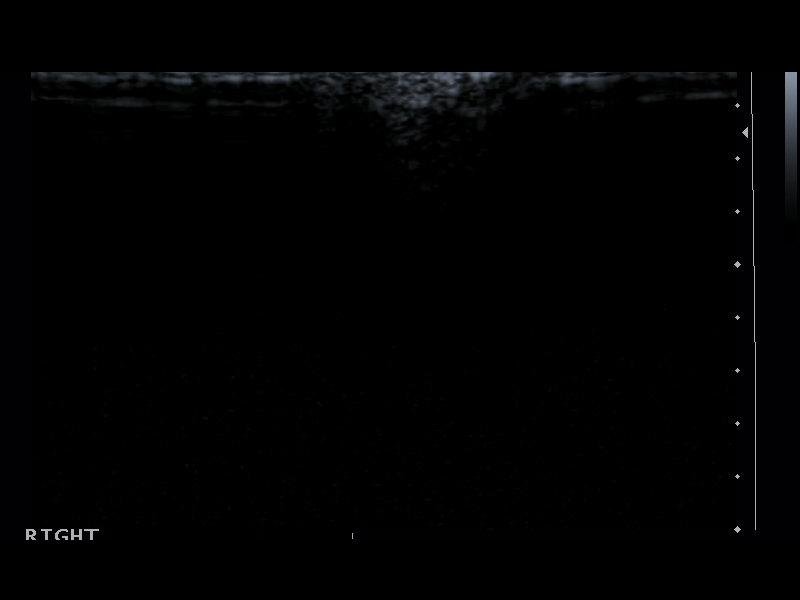
[im 6/6]
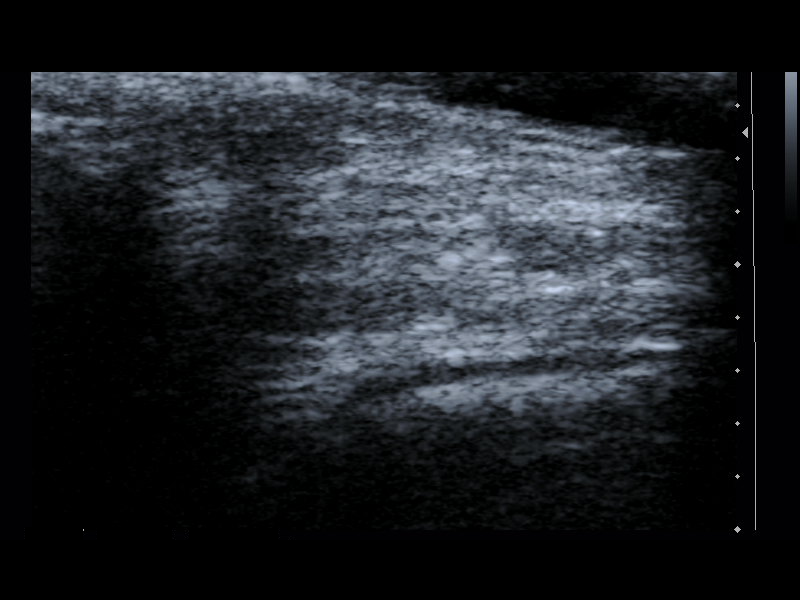

[6 of 6 positions shown; findings below may reference images not displayed]

Survey ultrasound of the right lower extremity was performed and 3
appropriate skin entry sites were marked. Sites were prepped with
alcohol and under direct ultrasound guidance a foam of 2 ml 0.75%
sodium tetradecyl sulfate was injected in small aliquots into the
residual patent symptomatic varicose veins until there was good
filling of the affected regions. Small pressure dressings were
applied to the venipuncture sites.

LEFT LOWER EXTREMITY

Survey ultrasound of the left lower extremity was performed and a
single appropriate skin entry site was marked. Sites were prepped
with alcohol and under direct ultrasound guidance 0.5 mL of 0.5%
sodium tetradecyl sulfate was injected in small aliquots into the
residual patent symptomatic varicose veins until there was good
filling of the affected regions. Small pressure dressings were
applied to the venipuncture sites.

The patient tolerated the procedure well, with no immediate
complication.
IMPRESSION: 1. Technically successful foam sclerotherapy of residual symptomatic
right lower extremity varicose veins.
2. Technically successful foam sclerotherapy therapy of residual
symptomatic left lower extremity varicose veins.
Follow-up in 1 week.

## 2015-09-24 ENCOUNTER — Other Ambulatory Visit: Payer: Self-pay | Admitting: Family Medicine

## 2015-09-24 NOTE — Telephone Encounter (Signed)
Yes thanks, 30 with 1 refill

## 2015-10-24 DIAGNOSIS — L821 Other seborrheic keratosis: Secondary | ICD-10-CM | POA: Diagnosis not present

## 2015-10-30 ENCOUNTER — Ambulatory Visit: Payer: Medicare Other | Admitting: Family Medicine

## 2015-11-06 ENCOUNTER — Ambulatory Visit: Payer: Medicare Other | Admitting: Family Medicine

## 2015-11-15 ENCOUNTER — Ambulatory Visit (INDEPENDENT_AMBULATORY_CARE_PROVIDER_SITE_OTHER): Payer: Medicare Other | Admitting: Family Medicine

## 2015-11-15 ENCOUNTER — Encounter: Payer: Self-pay | Admitting: Family Medicine

## 2015-11-15 DIAGNOSIS — I10 Essential (primary) hypertension: Secondary | ICD-10-CM

## 2015-11-15 DIAGNOSIS — M549 Dorsalgia, unspecified: Secondary | ICD-10-CM | POA: Diagnosis not present

## 2015-11-15 DIAGNOSIS — M1990 Unspecified osteoarthritis, unspecified site: Secondary | ICD-10-CM

## 2015-11-15 DIAGNOSIS — G8929 Other chronic pain: Secondary | ICD-10-CM

## 2015-11-15 DIAGNOSIS — R740 Nonspecific elevation of levels of transaminase and lactic acid dehydrogenase [LDH]: Secondary | ICD-10-CM | POA: Diagnosis not present

## 2015-11-15 DIAGNOSIS — I999 Unspecified disorder of circulatory system: Secondary | ICD-10-CM | POA: Diagnosis not present

## 2015-11-15 DIAGNOSIS — R7401 Elevation of levels of liver transaminase levels: Secondary | ICD-10-CM

## 2015-11-15 MED ORDER — ALBUTEROL SULFATE HFA 108 (90 BASE) MCG/ACT IN AERS: 1.0000 | INHALATION_SPRAY | Freq: Four times a day (QID) | RESPIRATORY_TRACT | 3 refills | Status: DC | PRN

## 2015-11-15 MED ORDER — HYDROCODONE-ACETAMINOPHEN 10-325 MG PO TABS: 1.0000 | ORAL_TABLET | Freq: Four times a day (QID) | ORAL | 0 refills | Status: DC | PRN

## 2015-11-15 NOTE — Assessment & Plan Note (Signed)
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 Also on proazac. Also on pletal for vasodilatory effects. Has [facility] follow up in December.  A/P: no change in therapy

## 2015-11-15 NOTE — Progress Notes (Signed)
Subjective:  Kari Welch is a 70 y.o. year old very pleasant female patient who presents for/with See problem oriented charting ROS- No chest pain. No headache or blurry vision. No fever. .see any ROS included in HPI as well.   Past Medical History-  Patient Active Problem List   Diagnosis Date Noted  . Chronic back pain 11/29/2013    Priority: High  . Hypercoagulable state ([place]) 05/26/2012    Priority: High  . Paroxysmal digital cyanosis 05/26/2012    Priority: High  . Vasculopathy 11/29/2007    Priority: High  . Transaminitis 05/02/2015    Priority: Medium  . Adjustment disorder with anxiety 11/29/2013    Priority: Medium  . Claudication ([place]) 05/26/2012    Priority: Medium  . Hyperlipidemia 02/04/2010    Priority: Medium  . Essential hypertension 11/19/2006    Priority: Medium  . Varicose veins of right lower extremity with pain     Priority: Low  . Macrocytosis without anemia 11/29/2013    Priority: Low  . Arthritis 11/15/2015    Medications- reviewed and updated Current Outpatient Prescriptions  Medication Sig Dispense Refill  . ALPRAZolam (XANAX) 0.5 MG tablet TAKE 1 TABLET EVERY 8 HOURS AS NEEDED. 30 tablet 1  . amLODipine (NORVASC) 5 MG tablet Take 1 tablet by mouth 2 (two) times daily.     Marland Kitchen aspirin (BABY ASPIRIN) 81 MG chewable tablet Chew 81 mg by mouth daily.      . cilostazol (PLETAL) 100 MG tablet Take 100 mg by mouth 2 (two) times daily.      Marland Kitchen estradiol (MINIVELLE) 0.0375 MG/24HR Place 1 patch onto the skin 2 (two) times a week. 12 patch 2  . FLUoxetine (PROZAC) 40 MG capsule TAKE (1) CAPSULE DAILY. 90 capsule 0  . HYDROcodone-acetaminophen (NORCO) 10-325 MG tablet Take 1 tablet by mouth every 6 (six) hours as needed for moderate pain. 60 tablet 0  . HYDROcodone-acetaminophen (NORCO) 10-325 MG tablet Take 1 tablet by mouth every 6 (six) hours as needed (may refill in 30 days). 60 tablet 0  . isosorbide mononitrate (IMDUR) 60 MG 24 hr tablet Take 60 mg by  mouth daily.      . potassium chloride SA (K-DUR,KLOR-CON) 20 MEQ tablet TAKE 1 TABLET DAILY. 30 tablet 0  . PROAIR HFA 108 (90 BASE) MCG/ACT inhaler every 6 (six) hours as needed.     . rosuvastatin (CRESTOR) 10 MG tablet Take 1 tablet by mouth daily.    Marland Kitchen triamterene-hydrochlorothiazide (MAXZIDE-25) 37.5-25 MG per tablet Take 1 tablet by mouth daily.     No current facility-administered medications for this visit.     Objective: BP 100/80 (BP Location: Left Arm, Patient Position: Sitting, Cuff Size: Normal)   Pulse 89   Temp 97.5 F (36.4 C) (Oral)   Ht 5\' 1"  (1.549 m)   Wt 134 lb (60.8 kg)   LMP 03/31/1977   SpO2 98%   BMI 25.32 kg/m  Gen: NAD, resting comfortably CV: RRR no murmurs rubs or gallops Lungs: CTAB no crackles, wheeze, rhonchi Abdomen: soft/nontender/nondistended/normal bowel sounds. No rebound or guarding.  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 Also on proazac. Also on pletal for vasodilatory effects. Has [facility] follow up in December.  A/P: no change  in therapy  Chronic back pain S: continue to avoid nsaids, tripatns. Continues to use about 15-20 vicodin a month for combo of low back wpain with neuropathy and migraines (4-5 a month). About #40 left from #120 given 6 months ago A/P: consider gabapentin- she will discuss with husband who is neurologist in town. Given another #60 of hydrocodone 10/325 and advised for this 100 to last 6 months which is reasonable given reported 15-20 a month. Needs pain medicine contract  Essential hypertension S: controlled on Amlodipine 5mg , imdur 60mg , maxzide 25mg  though most of meds are for vascular issues BP Readings from Last 3 Encounters:   11/15/15 100/80  05/02/15 100/70  04/23/15 100/70  A/P:Continue current medications   Transaminitis Follow up LFTs with labs in November.   Arthritis Hip and knee arthritis- script for [place] physical therapy. Years of issues- progressively worse. Bad days up to 7/10- better with rest. R knee and R hip.   6 months. Labs in late November.   Orders Placed This Encounter  Procedures  . CBC    Standing Status:   Future    Standing Expiration Date:   11/14/2016  . Comprehensive metabolic panel    [place]    Standing Status:   Future    Standing Expiration Date:   11/14/2016  . Lipid panel    Standing Status:   Future    Standing Expiration Date:   11/14/2016  . TSH    Standing Status:   Future    Standing Expiration Date:   11/14/2016  . POCT Urinalysis Dipstick (Automated)    Standing Status:   Future    Standing Expiration Date:   11/14/2016    Meds ordered this encounter  Medications  . HYDROcodone-acetaminophen (NORCO) 10-325 MG tablet    Sig: Take 1 tablet by mouth every 6 (six) hours as needed for moderate pain.    Dispense:  60 tablet    Refill:  0  . albuterol (PROAIR HFA) 108 (90 Base) MCG/ACT inhaler    Sig: Inhale 1-2 puffs into the lungs every 6 (six) hours as needed for wheezing or shortness of breath.    Dispense:  1 Inhaler    Refill:  3    Return precautions advised.  Garret Reddish, MD

## 2015-11-15 NOTE — Assessment & Plan Note (Signed)
Hip and knee arthritis- script for [place] physical therapy. Years of issues- progressively worse. Bad days up to 7/10- better with rest. R knee and R hip.

## 2015-11-15 NOTE — Assessment & Plan Note (Signed)
Follow up LFTs with labs in November.

## 2015-11-15 NOTE — Assessment & Plan Note (Signed)
S: continue to avoid nsaids, tripatns. Continues to use about 15-20 vicodin a month for combo of low back wpain with neuropathy and migraines (4-5 a month). About #40 left from #120 given 6 months ago A/P: consider gabapentin- she will discuss with husband who is neurologist in town. Given another #60 of hydrocodone 10/325 and advised for this 100 to last 6 months which is reasonable given reported 15-20 a month. Needs pain medicine contract

## 2015-11-15 NOTE — Assessment & Plan Note (Signed)
S: controlled on Amlodipine 5mg , imdur 60mg , maxzide 25mg  though most of meds are for vascular issues BP Readings from Last 3 Encounters:  11/15/15 100/80  05/02/15 100/70  04/23/15 100/70  A/P:Continue current medications

## 2015-11-15 NOTE — Progress Notes (Signed)
Pre visit review using our clinic review tool, if applicable. No additional management support is needed unless otherwise documented below in the visit note. 

## 2015-11-15 NOTE — Patient Instructions (Addendum)
See me around 6 months but definitely before you run out of hydrocodone (couldbe later than 6 months if needed)  Come by 02/27/16 or later for labs- schedule at check out   [facility] physical therapy is helpful for you  Ask your husband if any thoughts on gabapentin for your chronic pain and possible migraine prophylaxis

## 2015-12-04 DIAGNOSIS — M25551 Pain in right hip: Secondary | ICD-10-CM | POA: Diagnosis not present

## 2015-12-07 DIAGNOSIS — M25551 Pain in right hip: Secondary | ICD-10-CM | POA: Diagnosis not present

## 2015-12-17 DIAGNOSIS — M25551 Pain in right hip: Secondary | ICD-10-CM | POA: Diagnosis not present

## 2015-12-26 DIAGNOSIS — M25561 Pain in right knee: Secondary | ICD-10-CM | POA: Diagnosis not present

## 2015-12-26 DIAGNOSIS — M7061 Trochanteric bursitis, right hip: Secondary | ICD-10-CM | POA: Diagnosis not present

## 2015-12-26 DIAGNOSIS — M5137 Other intervertebral disc degeneration, lumbosacral region: Secondary | ICD-10-CM | POA: Diagnosis not present

## 2015-12-26 DIAGNOSIS — M5441 Lumbago with sciatica, right side: Secondary | ICD-10-CM | POA: Diagnosis not present

## 2016-01-08 DIAGNOSIS — M25561 Pain in right knee: Secondary | ICD-10-CM | POA: Diagnosis not present

## 2016-01-08 DIAGNOSIS — M5137 Other intervertebral disc degeneration, lumbosacral region: Secondary | ICD-10-CM | POA: Diagnosis not present

## 2016-01-08 DIAGNOSIS — M7061 Trochanteric bursitis, right hip: Secondary | ICD-10-CM | POA: Diagnosis not present

## 2016-01-10 DIAGNOSIS — M25561 Pain in right knee: Secondary | ICD-10-CM | POA: Diagnosis not present

## 2016-01-10 DIAGNOSIS — M6249 Contracture of muscle, multiple sites: Secondary | ICD-10-CM | POA: Diagnosis not present

## 2016-01-10 DIAGNOSIS — M6281 Muscle weakness (generalized): Secondary | ICD-10-CM | POA: Diagnosis not present

## 2016-01-10 DIAGNOSIS — M25551 Pain in right hip: Secondary | ICD-10-CM | POA: Diagnosis not present

## 2016-01-14 DIAGNOSIS — M6281 Muscle weakness (generalized): Secondary | ICD-10-CM | POA: Diagnosis not present

## 2016-01-14 DIAGNOSIS — M6249 Contracture of muscle, multiple sites: Secondary | ICD-10-CM | POA: Diagnosis not present

## 2016-01-14 DIAGNOSIS — M25551 Pain in right hip: Secondary | ICD-10-CM | POA: Diagnosis not present

## 2016-01-14 DIAGNOSIS — M25561 Pain in right knee: Secondary | ICD-10-CM | POA: Diagnosis not present

## 2016-01-17 DIAGNOSIS — M25551 Pain in right hip: Secondary | ICD-10-CM | POA: Diagnosis not present

## 2016-01-17 DIAGNOSIS — M25561 Pain in right knee: Secondary | ICD-10-CM | POA: Diagnosis not present

## 2016-01-17 DIAGNOSIS — M6249 Contracture of muscle, multiple sites: Secondary | ICD-10-CM | POA: Diagnosis not present

## 2016-01-17 DIAGNOSIS — M6281 Muscle weakness (generalized): Secondary | ICD-10-CM | POA: Diagnosis not present

## 2016-01-24 DIAGNOSIS — M25561 Pain in right knee: Secondary | ICD-10-CM | POA: Diagnosis not present

## 2016-01-24 DIAGNOSIS — M6249 Contracture of muscle, multiple sites: Secondary | ICD-10-CM | POA: Diagnosis not present

## 2016-01-24 DIAGNOSIS — M25551 Pain in right hip: Secondary | ICD-10-CM | POA: Diagnosis not present

## 2016-01-24 DIAGNOSIS — M6281 Muscle weakness (generalized): Secondary | ICD-10-CM | POA: Diagnosis not present

## 2016-01-28 DIAGNOSIS — M25561 Pain in right knee: Secondary | ICD-10-CM | POA: Diagnosis not present

## 2016-01-28 DIAGNOSIS — M6249 Contracture of muscle, multiple sites: Secondary | ICD-10-CM | POA: Diagnosis not present

## 2016-01-28 DIAGNOSIS — M25551 Pain in right hip: Secondary | ICD-10-CM | POA: Diagnosis not present

## 2016-01-28 DIAGNOSIS — M6281 Muscle weakness (generalized): Secondary | ICD-10-CM | POA: Diagnosis not present

## 2016-01-31 DIAGNOSIS — M25551 Pain in right hip: Secondary | ICD-10-CM | POA: Diagnosis not present

## 2016-01-31 DIAGNOSIS — M6249 Contracture of muscle, multiple sites: Secondary | ICD-10-CM | POA: Diagnosis not present

## 2016-01-31 DIAGNOSIS — M25561 Pain in right knee: Secondary | ICD-10-CM | POA: Diagnosis not present

## 2016-01-31 DIAGNOSIS — M6281 Muscle weakness (generalized): Secondary | ICD-10-CM | POA: Diagnosis not present

## 2016-02-04 DIAGNOSIS — M6249 Contracture of muscle, multiple sites: Secondary | ICD-10-CM | POA: Diagnosis not present

## 2016-02-04 DIAGNOSIS — M25551 Pain in right hip: Secondary | ICD-10-CM | POA: Diagnosis not present

## 2016-02-04 DIAGNOSIS — M25561 Pain in right knee: Secondary | ICD-10-CM | POA: Diagnosis not present

## 2016-02-04 DIAGNOSIS — M6281 Muscle weakness (generalized): Secondary | ICD-10-CM | POA: Diagnosis not present

## 2016-02-07 DIAGNOSIS — M6249 Contracture of muscle, multiple sites: Secondary | ICD-10-CM | POA: Diagnosis not present

## 2016-02-07 DIAGNOSIS — M25551 Pain in right hip: Secondary | ICD-10-CM | POA: Diagnosis not present

## 2016-02-07 DIAGNOSIS — M6281 Muscle weakness (generalized): Secondary | ICD-10-CM | POA: Diagnosis not present

## 2016-02-07 DIAGNOSIS — M25561 Pain in right knee: Secondary | ICD-10-CM | POA: Diagnosis not present

## 2016-02-11 ENCOUNTER — Telehealth: Payer: Self-pay | Admitting: Obstetrics and Gynecology

## 2016-02-11 DIAGNOSIS — M25551 Pain in right hip: Secondary | ICD-10-CM | POA: Diagnosis not present

## 2016-02-11 DIAGNOSIS — M25561 Pain in right knee: Secondary | ICD-10-CM | POA: Diagnosis not present

## 2016-02-11 DIAGNOSIS — M6249 Contracture of muscle, multiple sites: Secondary | ICD-10-CM | POA: Diagnosis not present

## 2016-02-11 DIAGNOSIS — M6281 Muscle weakness (generalized): Secondary | ICD-10-CM | POA: Diagnosis not present

## 2016-02-11 NOTE — Telephone Encounter (Signed)
Patient says she is trying to ger her hormone patch but her insurance need prior approval.

## 2016-02-11 NOTE — Telephone Encounter (Signed)
Spoke with patient. Advised PA for Estradiol (Minivelle 0.375 mg patches has been submitted via Cover My Meds through her insurance. Advised it can take 3-5 days to receive a response, but that she will be notified as soon as we receive notification from her insurance company. Patient is agreeable.

## 2016-02-14 NOTE — Telephone Encounter (Signed)
Need to advise patient PA for Minivelle was approved from 02/11/2016-02/10/2017.

## 2016-02-18 NOTE — Telephone Encounter (Signed)
Patient returning your call.

## 2016-02-18 NOTE — Telephone Encounter (Signed)
Left message to call Kaitlyn at [phone]. 

## 2016-02-28 DIAGNOSIS — M6249 Contracture of muscle, multiple sites: Secondary | ICD-10-CM | POA: Diagnosis not present

## 2016-02-28 DIAGNOSIS — M6281 Muscle weakness (generalized): Secondary | ICD-10-CM | POA: Diagnosis not present

## 2016-02-28 DIAGNOSIS — M25561 Pain in right knee: Secondary | ICD-10-CM | POA: Diagnosis not present

## 2016-02-28 DIAGNOSIS — M25551 Pain in right hip: Secondary | ICD-10-CM | POA: Diagnosis not present

## 2016-03-05 DIAGNOSIS — M25551 Pain in right hip: Secondary | ICD-10-CM | POA: Diagnosis not present

## 2016-03-05 DIAGNOSIS — M25561 Pain in right knee: Secondary | ICD-10-CM | POA: Diagnosis not present

## 2016-03-05 DIAGNOSIS — M6281 Muscle weakness (generalized): Secondary | ICD-10-CM | POA: Diagnosis not present

## 2016-03-05 DIAGNOSIS — M6249 Contracture of muscle, multiple sites: Secondary | ICD-10-CM | POA: Diagnosis not present

## 2016-03-06 ENCOUNTER — Other Ambulatory Visit (INDEPENDENT_AMBULATORY_CARE_PROVIDER_SITE_OTHER): Payer: Medicare Other

## 2016-03-06 DIAGNOSIS — I10 Essential (primary) hypertension: Secondary | ICD-10-CM | POA: Diagnosis not present

## 2016-03-06 DIAGNOSIS — R319 Hematuria, unspecified: Secondary | ICD-10-CM | POA: Diagnosis not present

## 2016-03-06 LAB — CBC
HEMATOCRIT: 45.6 % (ref 36.0–46.0)
HEMOGLOBIN: 15.4 g/dL — AB (ref 12.0–15.0)
MCHC: 33.7 g/dL (ref 30.0–36.0)
MCV: 104 fl — AB (ref 78.0–100.0)
PLATELETS: 392 10*3/uL (ref 150.0–400.0)
RBC: 4.39 Mil/uL (ref 3.87–5.11)
RDW: 13.5 % (ref 11.5–15.5)
WBC: 6.8 10*3/uL (ref 4.0–10.5)

## 2016-03-06 LAB — COMPREHENSIVE METABOLIC PANEL
ALBUMIN: 4.3 g/dL (ref 3.5–5.2)
ALT: 99 U/L — ABNORMAL HIGH (ref 0–35)
AST: 71 U/L — AB (ref 0–37)
Alkaline Phosphatase: 58 U/L (ref 39–117)
BUN: 16 mg/dL (ref 6–23)
CALCIUM: 10.1 mg/dL (ref 8.4–10.5)
CHLORIDE: 100 meq/L (ref 96–112)
CO2: 29 mEq/L (ref 19–32)
CREATININE: 1.13 mg/dL (ref 0.40–1.20)
GFR: 50.88 mL/min — AB (ref >60.00)
Glucose, Bld: 76 mg/dL (ref 70–99)
POTASSIUM: 4 meq/L (ref 3.5–5.1)
Sodium: 140 mEq/L (ref 135–145)
Total Bilirubin: 0.5 mg/dL (ref 0.2–1.2)
Total Protein: 7.9 g/dL (ref 6.0–8.3)

## 2016-03-06 LAB — POC URINALSYSI DIPSTICK (AUTOMATED)
Bilirubin, UA: NEGATIVE
GLUCOSE UA: NEGATIVE
KETONES UA: NEGATIVE
Nitrite, UA: NEGATIVE
PROTEIN UA: NEGATIVE
Spec Grav, UA: >=1.03
Urobilinogen, UA: 0.2
pH, UA: 6

## 2016-03-06 LAB — LIPID PANEL
CHOLESTEROL: 182 mg/dL (ref 0–200)
HDL: 86.4 mg/dL (ref >39.00)
LDL CALC: 77 mg/dL (ref 0–99)
NonHDL: 95.22
TRIGLYCERIDES: 92 mg/dL (ref 0.0–149.0)
Total CHOL/HDL Ratio: 2
VLDL: 18.4 mg/dL (ref 0.0–40.0)

## 2016-03-06 LAB — TSH: TSH: 3.35 u[IU]/mL (ref 0.35–4.50)

## 2016-03-06 NOTE — Telephone Encounter (Signed)
Left message for patient per DPR letting her know that her PA for the [place] was approved from 02/11/16 to 02/10/17. Instructed patient to call if she had any additional questions.   Routing to provider for final review. Patient agreeable to disposition. Will close encounter.

## 2016-03-07 ENCOUNTER — Telehealth: Payer: Self-pay

## 2016-03-07 LAB — URINALYSIS, MICROSCOPIC ONLY

## 2016-03-07 NOTE — Telephone Encounter (Signed)
error 

## 2016-03-07 NOTE — Telephone Encounter (Signed)
Received a call from [employer]. They were unable to run the urine microscopic as they was not enough sample to run the test.  Please advise

## 2016-03-10 ENCOUNTER — Encounter: Payer: Self-pay | Admitting: Family Medicine

## 2016-03-10 LAB — URINE CULTURE

## 2016-03-11 ENCOUNTER — Other Ambulatory Visit: Payer: Self-pay

## 2016-03-11 DIAGNOSIS — R8271 Bacteriuria: Secondary | ICD-10-CM

## 2016-03-11 DIAGNOSIS — R748 Abnormal levels of other serum enzymes: Secondary | ICD-10-CM

## 2016-03-11 DIAGNOSIS — G8929 Other chronic pain: Secondary | ICD-10-CM | POA: Diagnosis not present

## 2016-03-11 DIAGNOSIS — M25561 Pain in right knee: Secondary | ICD-10-CM | POA: Diagnosis not present

## 2016-03-11 NOTE — Telephone Encounter (Signed)
Repeat urine culture one month under bacteruria   LFTs under liver function test  Future orders and help patient set up labs for about 1 month please.   Thanks so much! [place]

## 2016-03-17 ENCOUNTER — Other Ambulatory Visit: Payer: Self-pay | Admitting: Family Medicine

## 2016-03-19 DIAGNOSIS — M25561 Pain in right knee: Secondary | ICD-10-CM | POA: Diagnosis not present

## 2016-03-19 DIAGNOSIS — G8929 Other chronic pain: Secondary | ICD-10-CM | POA: Diagnosis not present

## 2016-03-28 ENCOUNTER — Other Ambulatory Visit: Payer: Self-pay | Admitting: Obstetrics and Gynecology

## 2016-03-28 DIAGNOSIS — Z1231 Encounter for screening mammogram for malignant neoplasm of breast: Secondary | ICD-10-CM

## 2016-04-02 DIAGNOSIS — G8929 Other chronic pain: Secondary | ICD-10-CM | POA: Diagnosis not present

## 2016-04-02 DIAGNOSIS — M5137 Other intervertebral disc degeneration, lumbosacral region: Secondary | ICD-10-CM | POA: Diagnosis not present

## 2016-04-02 DIAGNOSIS — M5441 Lumbago with sciatica, right side: Secondary | ICD-10-CM | POA: Diagnosis not present

## 2016-04-02 DIAGNOSIS — M94261 Chondromalacia, right knee: Secondary | ICD-10-CM | POA: Diagnosis not present

## 2016-04-20 ENCOUNTER — Other Ambulatory Visit: Payer: Self-pay | Admitting: Obstetrics and Gynecology

## 2016-04-21 NOTE — Telephone Encounter (Signed)
Medication refill request: minivelle  Last AEX:  04/23/15  Next AEX: 05/26/16 Dr. Quincy Simmonds  Last MMG (if hormonal medication request): 04/26/15 [place] Left BIRADS2:benign. Has appt 04/29/16  Refill authorized: 04/23/15 #[id number]/[id number]. Today #24/0R?

## 2016-04-24 ENCOUNTER — Ambulatory Visit (INDEPENDENT_AMBULATORY_CARE_PROVIDER_SITE_OTHER): Payer: Medicare Other

## 2016-04-24 ENCOUNTER — Other Ambulatory Visit (INDEPENDENT_AMBULATORY_CARE_PROVIDER_SITE_OTHER): Payer: Medicare Other

## 2016-04-24 ENCOUNTER — Encounter: Payer: Self-pay | Admitting: Family Medicine

## 2016-04-24 DIAGNOSIS — R748 Abnormal levels of other serum enzymes: Secondary | ICD-10-CM

## 2016-04-24 DIAGNOSIS — R8271 Bacteriuria: Secondary | ICD-10-CM

## 2016-04-24 DIAGNOSIS — Z23 Encounter for immunization: Secondary | ICD-10-CM | POA: Diagnosis not present

## 2016-04-24 LAB — HEPATIC FUNCTION PANEL
ALK PHOS: 52 U/L (ref 39–117)
ALT: 38 U/L — AB (ref 0–35)
AST: 32 U/L (ref 0–37)
Albumin: 4.2 g/dL (ref 3.5–5.2)
BILIRUBIN DIRECT: 0.1 mg/dL (ref 0.0–0.3)
BILIRUBIN TOTAL: 0.6 mg/dL (ref 0.2–1.2)
Total Protein: 7.5 g/dL (ref 6.0–8.3)

## 2016-04-25 ENCOUNTER — Other Ambulatory Visit: Payer: Self-pay

## 2016-04-25 DIAGNOSIS — R319 Hematuria, unspecified: Secondary | ICD-10-CM

## 2016-04-25 LAB — URINE CULTURE

## 2016-04-28 DIAGNOSIS — I999 Unspecified disorder of circulatory system: Secondary | ICD-10-CM | POA: Diagnosis not present

## 2016-04-28 DIAGNOSIS — E782 Mixed hyperlipidemia: Secondary | ICD-10-CM | POA: Diagnosis not present

## 2016-04-29 ENCOUNTER — Ambulatory Visit
Admission: RE | Admit: 2016-04-29 | Discharge: 2016-04-29 | Disposition: A | Discharge status: Home / Self Care | Payer: Medicare Other | Source: Ambulatory Visit | Attending: Obstetrics and Gynecology | Admitting: Obstetrics and Gynecology

## 2016-04-29 DIAGNOSIS — Z1231 Encounter for screening mammogram for malignant neoplasm of breast: Secondary | ICD-10-CM | POA: Diagnosis not present

## 2016-04-30 DIAGNOSIS — M5441 Lumbago with sciatica, right side: Secondary | ICD-10-CM | POA: Diagnosis not present

## 2016-05-15 ENCOUNTER — Ambulatory Visit: Payer: Medicare Other | Admitting: Obstetrics and Gynecology

## 2016-05-26 ENCOUNTER — Ambulatory Visit (INDEPENDENT_AMBULATORY_CARE_PROVIDER_SITE_OTHER): Payer: Medicare Other | Admitting: Obstetrics and Gynecology

## 2016-05-26 ENCOUNTER — Encounter: Payer: Self-pay | Admitting: Obstetrics and Gynecology

## 2016-05-26 VITALS — BP 104/70 | HR 92 | Resp 12 | Ht 60.75 in | Wt 139.8 lb

## 2016-05-26 DIAGNOSIS — N951 Menopausal and female climacteric states: Secondary | ICD-10-CM

## 2016-05-26 DIAGNOSIS — Z01419 Encounter for gynecological examination (general) (routine) without abnormal findings: Secondary | ICD-10-CM | POA: Diagnosis not present

## 2016-05-26 NOTE — Patient Instructions (Signed)

## 2016-05-26 NOTE — Progress Notes (Signed)
71 y.o. G0P0 Married Caucasian female here for annual exam.    Not happy with lowered level of estrogen for the last 10 months.  Having hot flashes and night sweats.  It is increasing more and more.   Had normal thyroid function and glucose level check.   Having some back pain.  Doing injections.   Doing well with tx of vasculopathy form [facility].  Breast cancer in family - paternal aunt, 2 paternal great aunts, great grandmother.  No FH ovarian or uterine cancer.  Mother with colon cancer.  PCP:   Dr. Garret Reddish  Patient's last menstrual period was 03/31/1977.           Sexually active: Yes.    The current method of family planning is status post hysterectomy.    Exercising: Yes.    walking Smoker:  Former smoker   Health Maintenance: Pap:  06/18/00 negative  History of abnormal Pap:  yes MMG:  04/29/16 Breast density category C, BIRADS 1 negative Colonoscopy:  11/03/12 normal with Dr. Olevia Perches- repeat 5 years  BMD:   05/17/15  Result: osteopenia  TDaP:  03/31/08  HIV: has done in past  Hep C: 01/30/11 negative  Screening Labs: PCP Hb today: PCP, Urine today: PCP   reports that she quit smoking about 27 years ago. Her smoking use included Cigarettes. She has a 9.00 pack-year smoking history. She has never used smokeless tobacco. She reports that she drinks about 1.2 oz of alcohol per week . She reports that she does not use drugs.  Past Medical History:  Diagnosis Date  . Abnormal Pap smear    h/o  . Allergy    seasonal  . Arthritis   . Collagen vascular disease ([place])   . Elevated LFTs    Work up negative - Dr. Olevia Perches  . Hyperlipidemia   . Hyperplastic colon polyp   . Hypertension   . Internal hemorrhoids   . INTERNAL HEMORRHOIDS 01/24/2009   Qualifier: Diagnosis of  By: Nelson-Smith CMA (AAMA), Dottie    . Migraine   . Prothrombin gene mutation ([place])    heterozygous  . PVD (peripheral vascular disease) ([place])    takes Pletal to open blood flow of blood to fingers,  toes, and lower extremities  . Raynaud's disease   . Vasculitis ([place])   . Venous insufficiency    superficial left leg- seeing a vein specialist in August    Past Surgical History:  Procedure Laterality Date  . ABDOMINAL HYSTERECTOMY  1979   TAH/LSO  . ABLATION SAPHENOUS VEIN W/ RFA    . BUNIONECTOMY WITH HAMMERTOE RECONSTRUCTION Left 10/13/2013   Procedure: LEFT FIRST METATARSAL SCARF OSTEOTOMY;  LEFT MODIFIED MCBRIDE BUNIONECTOMY AND SECOND HAMMERTOE CORRECTION;  Surgeon: Wylene Simmer, MD;  Location: [place];  Service: Orthopedics;  Laterality: Left;  . CATARACT EXTRACTION Bilateral 09/2014   Dr. Talbert Forest  . CERVIX LESION DESTRUCTION  1970  . COLONOSCOPY    . COLONOSCOPY    . DILATION AND CURETTAGE OF UTERUS    . LASER ABLATION Bilateral    leg-left 8/14, right 12/14  . MASS EXCISION Left    hand  . osteopenia of hip  2017   T score -1.1  . PELVIC LAPAROSCOPY     d/t infertility  . right bartholin's gland cyst excision Right    1990s  . sympthectomy Left 2008   hand ([facility])    Current Outpatient Prescriptions  Medication Sig Dispense Refill  . albuterol (PROAIR HFA)  108 (90 Base) MCG/ACT inhaler Inhale 1-2 puffs into the lungs every 6 (six) hours as needed for wheezing or shortness of breath. 1 Inhaler 3  . ALPRAZolam (XANAX) 0.5 MG tablet TAKE 1 TABLET EVERY 8 HOURS AS NEEDED. 30 tablet 1  . amLODipine (NORVASC) 5 MG tablet Take 1 tablet by mouth 2 (two) times daily.     Marland Kitchen aspirin (BABY ASPIRIN) 81 MG chewable tablet Chew 81 mg by mouth daily.      . cilostazol (PLETAL) 100 MG tablet Take 100 mg by mouth 2 (two) times daily.      Marland Kitchen FLUoxetine (PROZAC) 40 MG capsule TAKE (1) CAPSULE DAILY. 90 capsule 0  . HYDROcodone-acetaminophen (NORCO) 10-325 MG tablet Take 1 tablet by mouth every 6 (six) hours as needed (may refill in 30 days). 60 tablet 0  . HYDROcodone-acetaminophen (NORCO) 10-325 MG tablet Take 1 tablet by mouth every 6 (six) hours as needed for moderate  pain. 60 tablet 0  . isosorbide mononitrate (IMDUR) 60 MG 24 hr tablet Take 60 mg by mouth daily.      Marland Kitchen MINIVELLE 0.0375 MG/24HR APPLY ONE PATCH TWICE A WEEK, CHANGE SITES AND REAPPLY AS DIRECTED. 8 patch 0  . potassium chloride SA (K-DUR,KLOR-CON) 20 MEQ tablet TAKE 1 TABLET DAILY. 30 tablet 0  . rosuvastatin (CRESTOR) 10 MG tablet Take 1 tablet by mouth daily.    Marland Kitchen triamterene-hydrochlorothiazide (MAXZIDE-25) 37.5-25 MG per tablet Take 1 tablet by mouth daily.    . calcium elemental as carbonate (BARIATRIC TUMS ULTRA) 400 MG chewable tablet Chew by mouth.     No current facility-administered medications for this visit.     Family History  Problem Relation Age of Onset  . Stroke Mother   . Colon cancer Mother 33    dec with colon ca  . Heart attack Mother   . Migraines Mother   . Stroke Father   . Heart attack Father   . Gallbladder disease Father 14    septic gallbladder  . Rheum arthritis Brother     dec age 38 ?Rheumatoid arthritis  . Diabetes Maternal Grandmother   . Esophageal cancer Neg Hx   . Stomach cancer Neg Hx   . Rectal cancer Neg Hx     ROS:  Pertinent items are noted in HPI.  Otherwise, a comprehensive ROS was negative.  Exam:   BP 104/70 (BP Location: Right Arm, Patient Position: Sitting, Cuff Size: Normal)   Pulse 92   Resp 12   Ht 5' 0.75" (1.543 m)   Wt 139 lb 12.8 oz (63.4 kg)   LMP 03/31/1977   BMI 26.63 kg/m     General appearance: alert, cooperative and appears stated age Head: Normocephalic, without obvious abnormality, atraumatic Neck: no adenopathy, supple, symmetrical, trachea midline and thyroid normal to inspection and palpation Lungs: clear to auscultation bilaterally Breasts: normal appearance, no masses or tenderness, No nipple retraction or dimpling, No nipple discharge or bleeding, No axillary or supraclavicular adenopathy Heart: regular rate and rhythm Abdomen: soft, non-tender; no masses, no organomegaly Extremities: extremities  normal, atraumatic, no cyanosis or edema Skin: Skin color, texture, turgor normal. No rashes or lesions Lymph nodes: Cervical, supraclavicular, and axillary nodes normal. No abnormal inguinal nodes palpated Neurologic: Grossly normal  Pelvic: External genitalia:  no lesions              Urethra:  normal appearing urethra with no masses, tenderness or lesions  Bartholins and Skenes: normal                 Vagina: normal appearing vagina with normal color and discharge, no lesions              Cervix absent.               Pap taken: No. Bimanual Exam:  Uterus:  Absent.               Adnexa: no mass, fullness, tenderness              Rectal exam: Yes.  .  Confirms.              Anus:  normal sphincter tone, no lesions  Chaperone was present for exam.  Assessment:   Well woman visit with normal exam. Status post TAH/LSO.  ERT patient.  Menopausal symptoms. Hx of peripheral vascular disease. Osteopenia.  Prothrombin gene mutation - heterozygous.  This is new information today that I am finding on Care Everywhere.   Plan: Mammogram screening discussed. Recommended self breast awareness. Pap and HR HPV as above. Guidelines for Calcium, Vitamin D, regular exercise program including cardiovascular and weight bearing exercise. Stop all estrogen treatments.  I discussed risk of DVT, PE, MI, and stroke.  I discussed gabapentin and Catapress for tx of hot flashes.  She will check with her providers at [facility] about this.  We talked about other ways to deal with menopausal symptoms. Herbal options, dressing in layers, natural fabrics, avoid hot/spicy foods, use natural fabrics for sheets/bed covers. Follow up annually and prn.   An additional 15 minutes spent discussing the prothrombin gene mutation and the risks related to estrogen use.  Over 50% was spent in counseling.   After visit summary provided.

## 2016-06-05 ENCOUNTER — Telehealth: Payer: Self-pay | Admitting: Family Medicine

## 2016-06-05 ENCOUNTER — Other Ambulatory Visit: Payer: Self-pay

## 2016-06-05 MED ORDER — HYDROCODONE-ACETAMINOPHEN 10-325 MG PO TABS: 1.0000 | ORAL_TABLET | Freq: Four times a day (QID) | ORAL | 0 refills | Status: DC | PRN

## 2016-06-05 NOTE — Telephone Encounter (Signed)
Prescription printed out. Prescription will be placed at front desk for pick up.

## 2016-06-05 NOTE — Telephone Encounter (Signed)
Pt need new Rx for Hydrocodone   Pt is aware of 3 business days for refills. °

## 2016-06-06 IMAGING — US US EXTREM LOW VENOUS*R*
1 series · 13 of 24 positions shown · non-contrast
Comparison: None.

CLINICAL DATA: One week status post right anterior lateral
saphenous transcatheter laser occlusion for venous insufficiency and
varicose veins



[Series 1: us extrem low venous*right* · 13 of 24 slices shown]
[im 1/24]
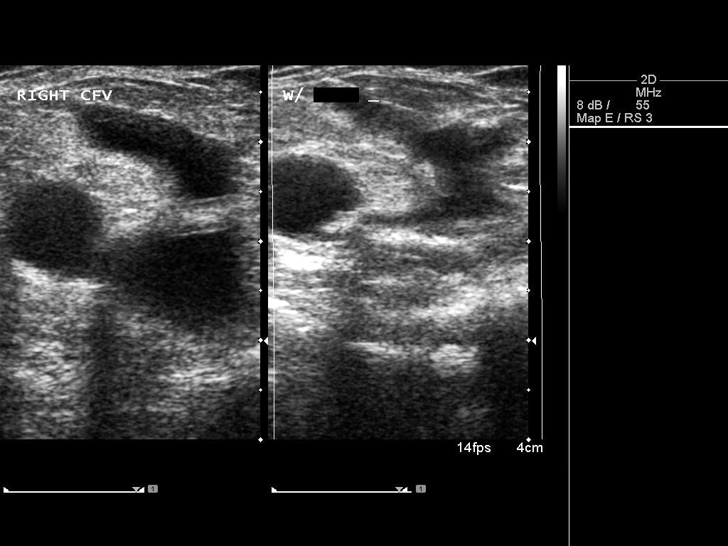
[im 3/24]
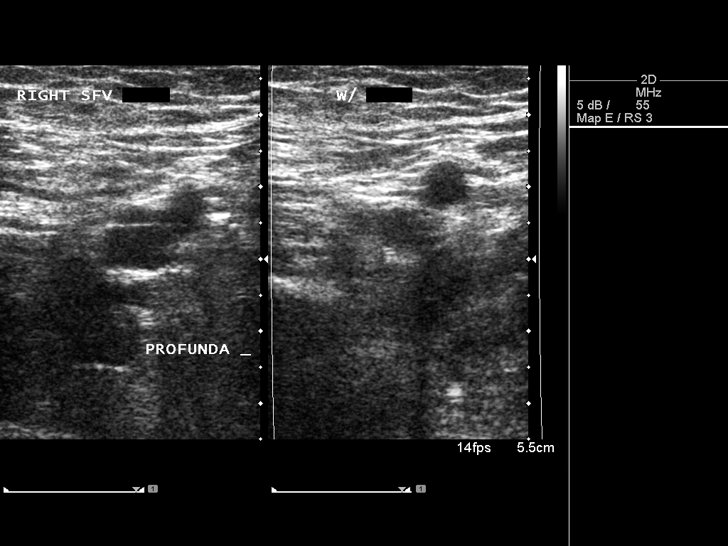
[im 5/24]
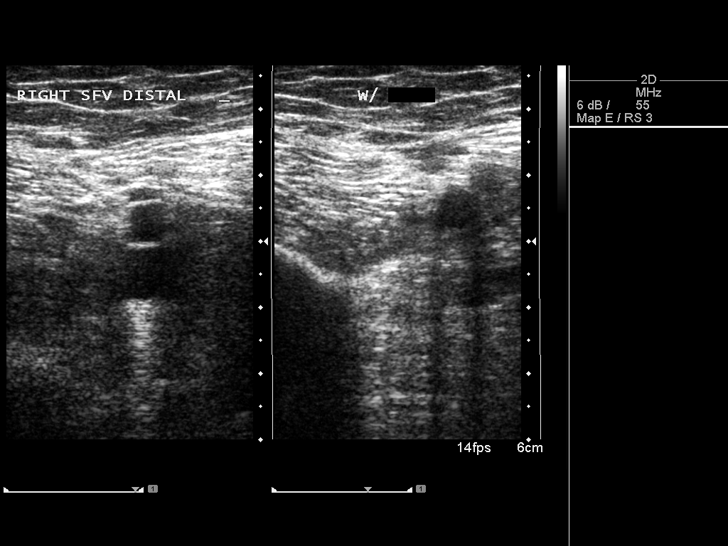
[im 7/24]
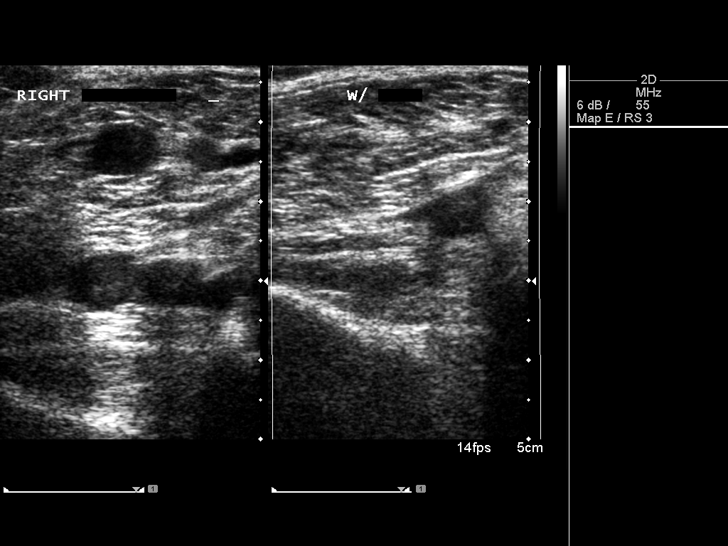
[im 9/24]
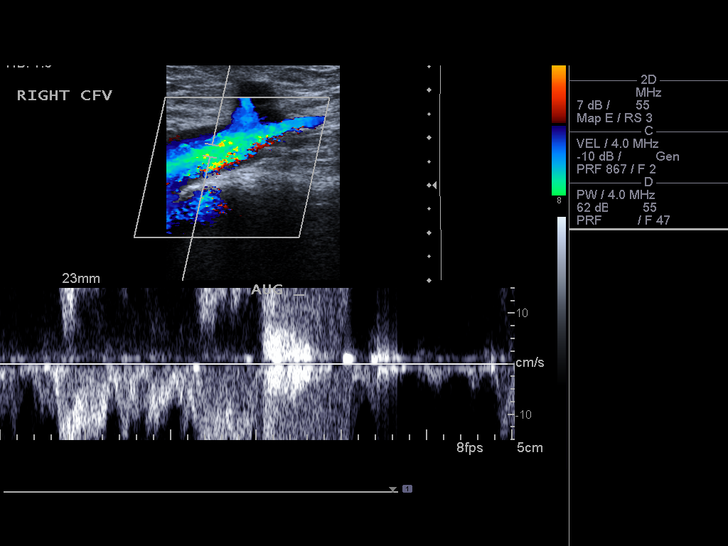
[im 11/24]
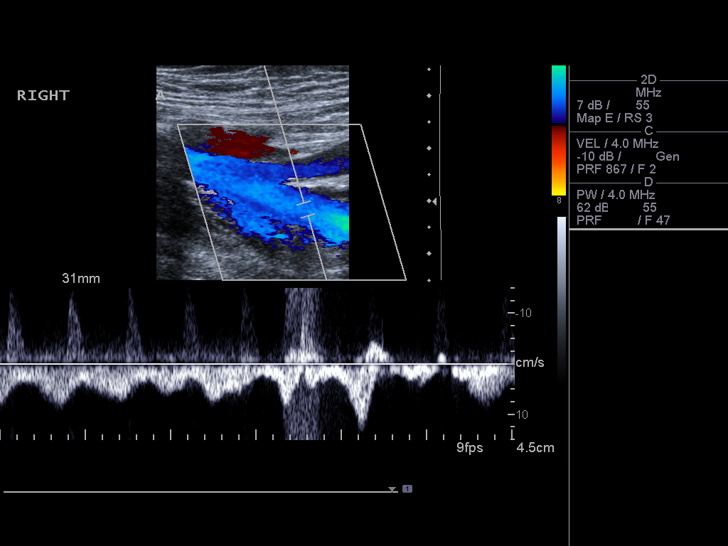
[im 13/24]
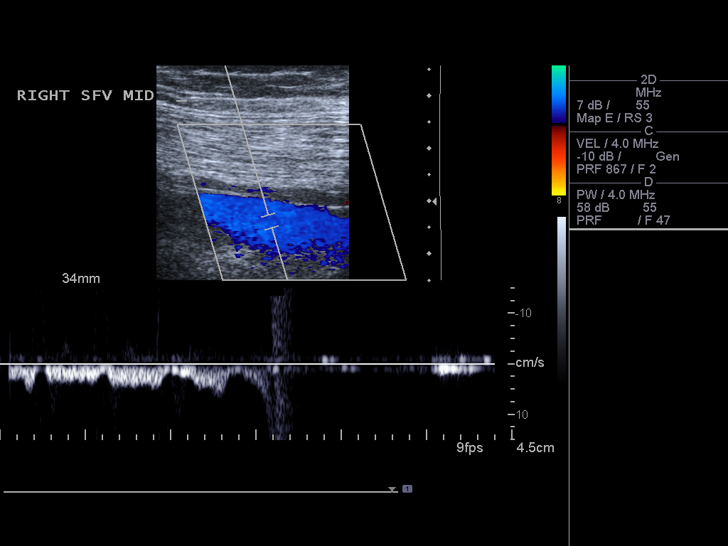
[im 14/24]
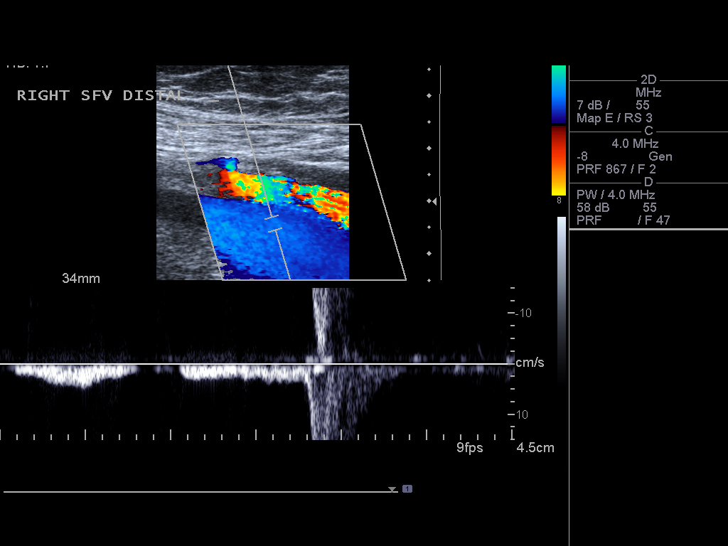
[im 16/24]
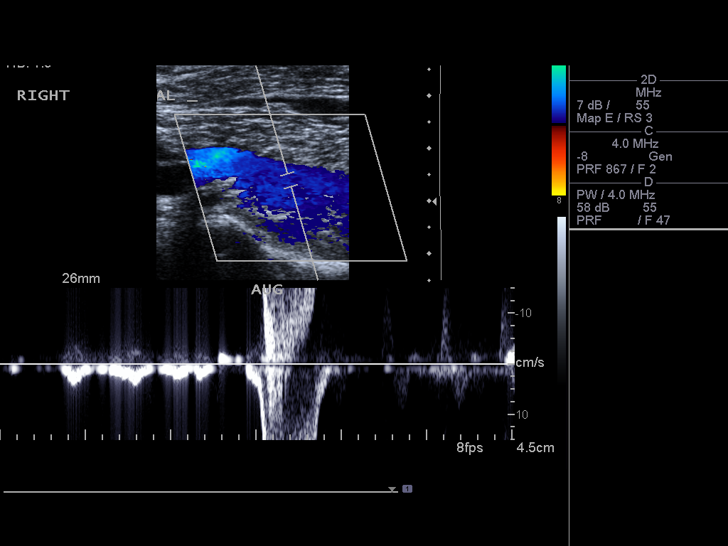
[im 18/24]
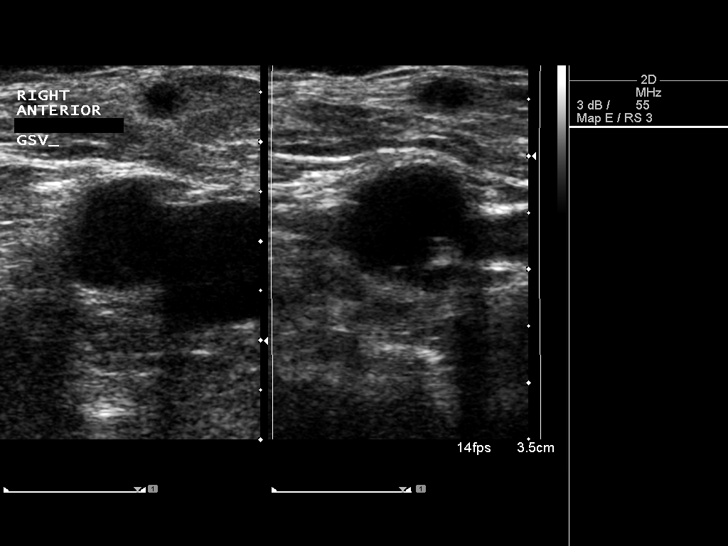
[im 20/24]
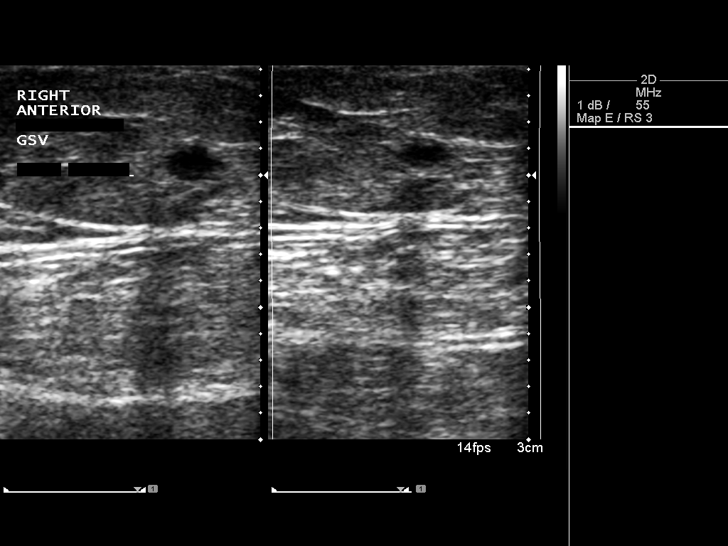
[im 22/24]
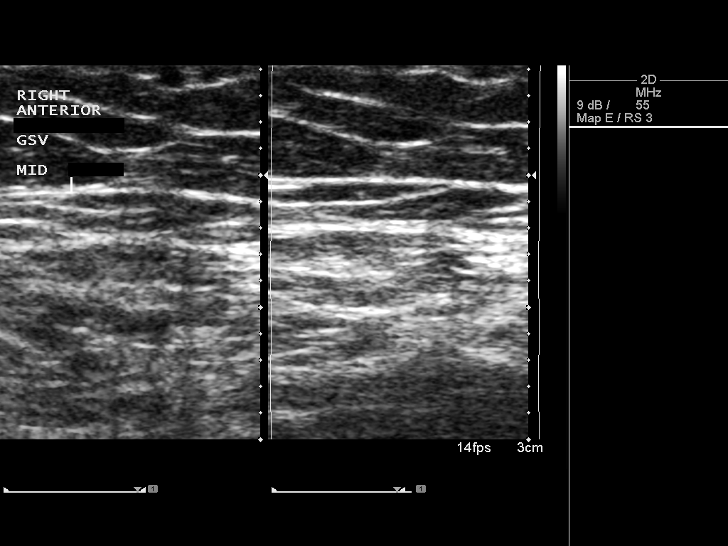
[im 24/24]
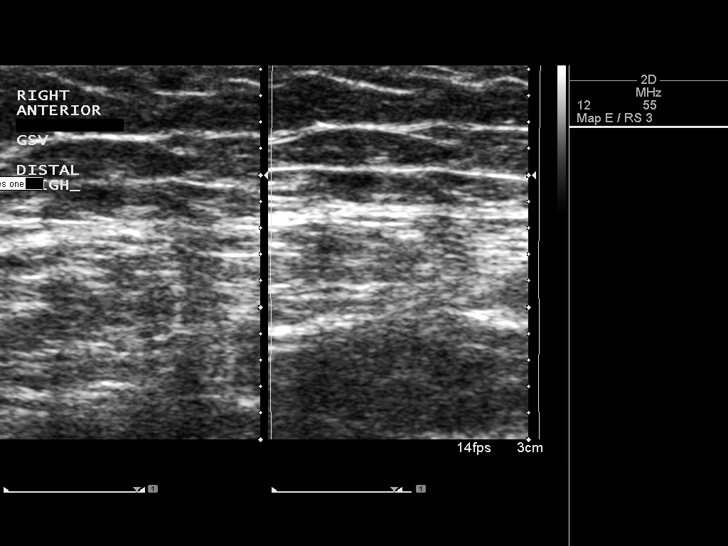

[13 of 24 positions shown; findings below may reference images not displayed]

FINDINGS: Right anterior lateral saphenous segment in the mid thigh
demonstrates thrombotic occlusion extending to the saphenous femoral
junction proximally. The entire treated segment remains occluded. No
early recanalization. No significant sub surface varicosities in the
region demonstrated. Negative for DVT. Right common femoral, femoral
and popliteal veins demonstrate normal compressibility, phasicity
and augmentation about DVT.
IMPRESSION: Right anterolateral saphenous treated segment remains occluded at 1
week.

Negative for DVT.

## 2016-07-23 DIAGNOSIS — S93401A Sprain of unspecified ligament of right ankle, initial encounter: Secondary | ICD-10-CM | POA: Diagnosis not present

## 2016-07-23 DIAGNOSIS — M25571 Pain in right ankle and joints of right foot: Secondary | ICD-10-CM | POA: Diagnosis not present

## 2016-08-11 ENCOUNTER — Encounter: Payer: Self-pay | Admitting: Obstetrics and Gynecology

## 2016-08-14 ENCOUNTER — Encounter: Payer: Self-pay | Admitting: Family Medicine

## 2016-09-24 ENCOUNTER — Other Ambulatory Visit: Payer: Self-pay | Admitting: Family Medicine

## 2016-09-25 ENCOUNTER — Other Ambulatory Visit: Payer: Self-pay | Admitting: Family Medicine

## 2016-10-20 ENCOUNTER — Telehealth: Payer: Self-pay | Admitting: Family Medicine

## 2016-10-20 NOTE — Telephone Encounter (Signed)
Pt need new Rx for Hydrocodone   Pt is aware of 3 business days for refills and someone will call when ready for pick up. °

## 2016-10-21 ENCOUNTER — Other Ambulatory Visit: Payer: Self-pay

## 2016-10-21 MED ORDER — HYDROCODONE-ACETAMINOPHEN 10-325 MG PO TABS: 1.0000 | ORAL_TABLET | Freq: Four times a day (QID) | ORAL | 0 refills | Status: DC | PRN

## 2016-10-21 NOTE — Telephone Encounter (Signed)
Prescription printed. Placed at front desk. Called patient to let her know

## 2017-01-20 ENCOUNTER — Ambulatory Visit (INDEPENDENT_AMBULATORY_CARE_PROVIDER_SITE_OTHER): Payer: Medicare Other

## 2017-01-20 DIAGNOSIS — Z23 Encounter for immunization: Secondary | ICD-10-CM

## 2017-03-04 ENCOUNTER — Ambulatory Visit: Payer: Medicare Other | Admitting: Family Medicine

## 2017-03-04 ENCOUNTER — Encounter: Payer: Self-pay | Admitting: Family Medicine

## 2017-03-04 VITALS — BP 110/80 | HR 84 | Temp 97.4°F | Ht 60.75 in | Wt 140.4 lb

## 2017-03-04 DIAGNOSIS — I999 Unspecified disorder of circulatory system: Secondary | ICD-10-CM | POA: Diagnosis not present

## 2017-03-04 DIAGNOSIS — D6859 Other primary thrombophilia: Secondary | ICD-10-CM

## 2017-03-04 DIAGNOSIS — G8929 Other chronic pain: Secondary | ICD-10-CM

## 2017-03-04 DIAGNOSIS — E785 Hyperlipidemia, unspecified: Secondary | ICD-10-CM | POA: Diagnosis not present

## 2017-03-04 DIAGNOSIS — M5441 Lumbago with sciatica, right side: Secondary | ICD-10-CM

## 2017-03-04 DIAGNOSIS — Z79899 Other long term (current) drug therapy: Secondary | ICD-10-CM | POA: Diagnosis not present

## 2017-03-04 DIAGNOSIS — H6120 Impacted cerumen, unspecified ear: Secondary | ICD-10-CM | POA: Diagnosis not present

## 2017-03-04 DIAGNOSIS — N183 Chronic kidney disease, stage 3 unspecified: Secondary | ICD-10-CM

## 2017-03-04 DIAGNOSIS — I10 Essential (primary) hypertension: Secondary | ICD-10-CM

## 2017-03-04 LAB — URINALYSIS
Bilirubin Urine: NEGATIVE
HGB URINE DIPSTICK: NEGATIVE
Ketones, ur: NEGATIVE
Leukocytes, UA: NEGATIVE
NITRITE: NEGATIVE
Specific Gravity, Urine: 1.01 (ref 1.000–1.030)
TOTAL PROTEIN, URINE-UPE24: NEGATIVE
UROBILINOGEN UA: 0.2 (ref 0.0–1.0)
Urine Glucose: NEGATIVE
pH: 6.5 (ref 5.0–8.0)

## 2017-03-04 LAB — LIPID PANEL
CHOLESTEROL: 174 mg/dL (ref 0–200)
HDL: 82.2 mg/dL (ref >39.00)
LDL CALC: 79 mg/dL (ref 0–99)
NonHDL: 91.9
TRIGLYCERIDES: 64 mg/dL (ref 0.0–149.0)
Total CHOL/HDL Ratio: 2
VLDL: 12.8 mg/dL (ref 0.0–40.0)

## 2017-03-04 LAB — COMPREHENSIVE METABOLIC PANEL
ALBUMIN: 4.3 g/dL (ref 3.5–5.2)
ALT: 29 U/L (ref 0–35)
AST: 28 U/L (ref 0–37)
Alkaline Phosphatase: 49 U/L (ref 39–117)
BUN: 17 mg/dL (ref 6–23)
CALCIUM: 9.5 mg/dL (ref 8.4–10.5)
CO2: 25 mEq/L (ref 19–32)
CREATININE: 1.12 mg/dL (ref 0.40–1.20)
Chloride: 102 mEq/L (ref 96–112)
GFR: 51.25 mL/min — AB (ref >60.00)
Glucose, Bld: 87 mg/dL (ref 70–99)
Potassium: 4.3 mEq/L (ref 3.5–5.1)
Sodium: 137 mEq/L (ref 135–145)
Total Bilirubin: 0.5 mg/dL (ref 0.2–1.2)
Total Protein: 7.6 g/dL (ref 6.0–8.3)

## 2017-03-04 LAB — CBC
HEMATOCRIT: 44.7 % (ref 36.0–46.0)
HEMOGLOBIN: 14.9 g/dL (ref 12.0–15.0)
MCHC: 33.3 g/dL (ref 30.0–36.0)
MCV: 106.5 fl — ABNORMAL HIGH (ref 78.0–100.0)
PLATELETS: 363 10*3/uL (ref 150.0–400.0)
RBC: 4.2 Mil/uL (ref 3.87–5.11)
RDW: 13.8 % (ref 11.5–15.5)
WBC: 6 10*3/uL (ref 4.0–10.5)

## 2017-03-04 LAB — TSH: TSH: 5.99 u[IU]/mL — ABNORMAL HIGH (ref 0.35–4.50)

## 2017-03-04 MED ORDER — ALPRAZOLAM 0.5 MG PO TABS: 0.5000 mg | ORAL_TABLET | Freq: Three times a day (TID) | ORAL | 1 refills | Status: DC | PRN

## 2017-03-04 MED ORDER — HYDROCODONE-ACETAMINOPHEN 10-325 MG PO TABS: 1.0000 | ORAL_TABLET | Freq: Four times a day (QID) | ORAL | 0 refills | Status: DC | PRN

## 2017-03-04 NOTE — Assessment & Plan Note (Signed)
S: small ear canals and tends to get fullness in ear- right one bothering her.  A/P: bilateral cerumen- impaction on left. Irrigated with good result- clear TM

## 2017-03-04 NOTE — Assessment & Plan Note (Addendum)
S: follows at [facility] Dr. Clayton Bibles at [facility]) for vasculopathy (scleroderma mentioned in past [facility] notes). Raynauds like phenomenon- remains on amlodipine 5 mg id, imdur, pletal for vasodilatory effect.  A/P: continue current therapy. Has to avoid nsaids as result. Apparently [facility] cardiology ok with estrogen therapy (prior by GYN) even with this and hypercoagulable state- I told patient once again I would not prescribe the estrogen replacement due to potential risk (prior prolonged discussion with physician husband by phone within last 6 months).   They may look for new GYN as well- I gave them some names in the community to see if someone else will provide this as she had horrible withdrawal from it/did not tolerate it with bad hot flashes

## 2017-03-04 NOTE — Assessment & Plan Note (Signed)
S: controlled on Amlodipine 5mg  BID, imdur 60mg , maxzide 25mg  though most of meds are for vascular issues BP Readings from Last 3 Encounters:  03/04/17 110/80  05/26/16 104/70  11/15/15 100/80  A/P:Continue current medications

## 2017-03-04 NOTE — Patient Instructions (Signed)
As you know, vicodin and xanax are both high risk medications. They should not be taken within 6-8 hours of each other. There are risks to taking together including severe sedation, respiratory depression, even death.   With the STOP act, we have to now see you in person for each opiod prescription.   Please stop by lab before you go  Ears  Lung exam

## 2017-03-04 NOTE — Assessment & Plan Note (Addendum)
S: Patient has prothrombin mutation- Heterozygous without clinical history of arterial or venous thrombosis.  A/P: given this mutation- I am not recommending patient use estrogen replacement but since cardiology is prescribing- she will continue under their care for this

## 2017-03-04 NOTE — Progress Notes (Signed)
Subjective:  GLORIA RICARDO is a 71 y.o. year old very pleasant female patient who presents for/with See problem oriented charting ROS- complains of back pain, right knee pain. Intermittent migraines- is supposed to avoid nsaids. No edema.    Past Medical History-  Patient Active Problem List   Diagnosis Date Noted  . Chronic back pain 11/29/2013    Priority: High  . Hypercoagulable state ([place]) 05/26/2012    Priority: High  . Paroxysmal digital cyanosis 05/26/2012    Priority: High  . Vasculopathy 11/29/2007    Priority: High  . Transaminitis 05/02/2015    Priority: Medium  . Adjustment disorder with anxiety 11/29/2013    Priority: Medium  . Claudication ([place]) 05/26/2012    Priority: Medium  . Hyperlipidemia 02/04/2010    Priority: Medium  . Essential hypertension 11/19/2006    Priority: Medium  . Varicose veins of right lower extremity with pain     Priority: Low  . Macrocytosis without anemia 11/29/2013    Priority: Low  . Arthritis 11/15/2015    Medications- reviewed and updated Current Outpatient Medications  Medication Sig Dispense Refill  . albuterol (PROAIR HFA) 108 (90 Base) MCG/ACT inhaler Inhale 1-2 puffs into the lungs every 6 (six) hours as needed for wheezing or shortness of breath. 1 Inhaler 3  . ALPRAZolam (XANAX) 0.5 MG tablet TAKE 1 TABLET EVERY 8 HOURS AS NEEDED. 30 tablet 1  . amLODipine (NORVASC) 5 MG tablet Take 1 tablet by mouth 2 (two) times daily.     Marland Kitchen aspirin (BABY ASPIRIN) 81 MG chewable tablet Chew 81 mg by mouth daily.      . calcium elemental as carbonate (BARIATRIC TUMS ULTRA) 400 MG chewable tablet Chew by mouth.    . cilostazol (PLETAL) 100 MG tablet Take 100 mg by mouth 2 (two) times daily.      Marland Kitchen estradiol (CLIMARA - DOSED IN MG/24 HR) 0.0375 mg/24hr patch Place 0.0375 mg onto the skin once a week.    Marland Kitchen FLUoxetine (PROZAC) 40 MG capsule TAKE (1) CAPSULE DAILY. 90 capsule 0  . HYDROcodone-acetaminophen (NORCO) 10-325 MG tablet Take 1 tablet  by mouth every 6 (six) hours as needed (may refill in 30 days). 60 tablet 0  . isosorbide mononitrate (IMDUR) 60 MG 24 hr tablet Take 60 mg by mouth daily.      . potassium chloride SA (K-DUR,KLOR-CON) 20 MEQ tablet TAKE 1 TABLET DAILY. 30 tablet 0  . rosuvastatin (CRESTOR) 10 MG tablet Take 1 tablet by mouth daily.    Marland Kitchen triamterene-hydrochlorothiazide (MAXZIDE-25) 37.5-25 MG per tablet Take 1 tablet by mouth daily.     No current facility-administered medications for this visit.     Objective: BP 110/80 (BP Location: Left Arm, Patient Position: Sitting, Cuff Size: Large)   Pulse 84   Temp (!) 97.4 F (36.3 C) (Oral)   Ht 5' 0.75" (1.543 m)   Wt 140 lb 6.4 oz (63.7 kg)   LMP 03/31/1977   SpO2 94%   BMI 26.75 kg/m  Gen: NAD, resting comfortably TM obscured bilaterally- irrigation performed with TM visible bilaterally afterwards Pharynx largely normal, nares erythematous CV: RRR no murmurs rubs or gallops Lungs: CTAB no crackles, wheeze, rhonchi Abdomen: soft/nontender/nondistended/normal bowel sounds. No rebound or guarding.  Ext: no edema Skin: warm, dry  Assessment/Plan:  Cough  S: cough for 10 days, worse in the AM- yellow phlegm. Hoarse/raspy voice. Apparently some question of morning cough and asthma- uses albuterol in the morning at times and helps.  Sometimes 15 minute coughing fits with that. This is different- off and on all day. Feels like chest congestion. Denies sinus pressure. No nasal discharge- ift has this it is clear. No fever. Slight wheeze. Mildest of shortness of breath.  A/P: suspect URI- lungs clear. . 10 days in and not improving- yellow discharge. They may all in if not improved by next week (5 more days) and would send in azithromycin given lack of improvement. Discussed prednisone but states she does not tolerate this well at all. Obviously should return to care if symptoms worsen  Other issues-  1. [place] [facility] not helpful, went to another office and had some  imrpovement, saw Dr. Delilah Shan [place] [facility]- MRI was normal- so thought referred. Saw Dr. Nelva Bush and SI joint injection really helped.  2. LFTs slightly up in past- repeat today 3. Wants to go ahead and do Lipid panel today though last full panel 03/06/16- she understands possibility of being charged for this 4. Breast exam- requests me to do this today. No abnormal findings.   Vasculopathy S: follows at [facility] Dr. Clayton Bibles at [facility]) for vasculopathy (scleroderma mentioned in past [facility] notes). Raynauds like phenomenon- remains on amlodipine 5 mg id, imdur, pletal for vasodilatory effect.  A/P: continue current therapy. Has to avoid nsaids as result. Apparently [facility] cardiology ok with estrogen therapy (prior by GYN) even with this and hypercoagulable state- I told patient once again I would not prescribe the estrogen replacement due to potential risk (prior prolonged discussion with physician husband by phone within last 6 months).   They may look for new GYN as well- I gave them some names in the community to see if someone else will provide this as she had horrible withdrawal from it/did not tolerate it with bad hot flashes  Hypercoagulable state ([place]) S: Patient has prothrombin mutation- Heterozygous without clinical history of arterial or venous thrombosis.  A/P: given this mutation- I am not recommending patient use estrogen replacement but since cardiology is prescribing- she will continue under their care for this   Chronic back pain S: Patient has been told to avoid ibuprofen and sumatriptan for migraines in the past due to vasculopathy. She therefore uses hydrocodone 10/325 about 15-20 a month- sometimes as low as 10. Last given #60 in July.  A/P: [facility] reviewed- xanax and hydrocodone only through this office per review. We also discussed risks of these medications and importance of not using together- she expresses understanding. See avs comments. Controlled substance contract today as well as  UDS. Refilled #60 with expectation this lasts 3-4 months. With new STOP act law- has to be seen in office for refills.   Essential hypertension S: controlled on Amlodipine 5mg  BID, imdur 60mg , maxzide 25mg  though most of meds are for vascular issues BP Readings from Last 3 Encounters:  03/04/17 110/80  05/26/16 104/70  11/15/15 100/80  A/P:Continue current medications  Cerumen impaction S: small ear canals and tends to get fullness in ear- right one bothering her.  A/P: bilateral cerumen- impaction on left. Irrigated with good result- clear TM  CKD (chronic kidney disease), stage III ([place]) Noted on labs- luckily already avoids nsaids and BP controlled  Future Appointments  Date Time Provider [place]  06/03/2017  1:30 PM Nunzio Cobbs, MD [place] None   The duration of face-to-face time during this visit was greater than 40 minutes (initially visit 10:58-11:29 but went back into room for breast exam and explanation, review of AVS, discussion  of lavage and other methods including mineral oil for ear and additional 10 minutes spent on this interaction). Greater than 50% of this time was spent in counseling, explanation of diagnosis, planning of further management, and/or coordination of care including discussion of risks of opiods and benzodiazepines and combination, explaining reasoning and potential costs of UDS and why I thought patient should still have one despite husband stating low risk, difficulty of dealing with chronic pain, reasons I will not prescribe the hormone replacement.   Return precautions advised.  Garret Reddish, MD

## 2017-03-04 NOTE — Assessment & Plan Note (Addendum)
S: Patient has been told to avoid ibuprofen and sumatriptan for migraines in the past due to vasculopathy. She therefore uses hydrocodone 10/325 about 15-20 a month- sometimes as low as 10. Last given #60 in July.  A/P: [facility] reviewed- xanax and hydrocodone only through this office per review. We also discussed risks of these medications and importance of not using together- she expresses understanding. See avs comments. Controlled substance contract today as well as UDS. Refilled #60 with expectation this lasts 3-4 months. With new STOP act law- has to be seen in office for refills.

## 2017-03-04 NOTE — Assessment & Plan Note (Signed)
Noted on labs- luckily already avoids nsaids and BP controlled

## 2017-03-05 ENCOUNTER — Other Ambulatory Visit: Payer: Self-pay

## 2017-03-05 DIAGNOSIS — R7989 Other specified abnormal findings of blood chemistry: Secondary | ICD-10-CM

## 2017-03-06 ENCOUNTER — Other Ambulatory Visit: Payer: Self-pay

## 2017-03-06 DIAGNOSIS — R7989 Other specified abnormal findings of blood chemistry: Secondary | ICD-10-CM

## 2017-03-09 ENCOUNTER — Encounter: Payer: Self-pay | Admitting: Family Medicine

## 2017-03-11 MED ORDER — AZITHROMYCIN 250 MG PO TABS: ORAL_TABLET | ORAL | 0 refills | Status: DC

## 2017-03-16 LAB — PAIN MGMT, PROFILE 8 W/CONF, U
6 ACETYLMORPHINE: NEGATIVE ng/mL (ref <10)
ALCOHOL METABOLITES: POSITIVE ng/mL — AB (ref <500)
ALPHAHYDROXYMIDAZOLAM: NEGATIVE ng/mL (ref <50)
AMINOCLONAZEPAM: NEGATIVE ng/mL (ref <25)
Alphahydroxyalprazolam: NEGATIVE ng/mL (ref <25)
Alphahydroxytriazolam: NEGATIVE ng/mL (ref <50)
Amphetamines: NEGATIVE ng/mL (ref <500)
BENZODIAZEPINES: NEGATIVE ng/mL (ref <100)
Buprenorphine, Urine: NEGATIVE ng/mL (ref <5)
COCAINE METABOLITE: NEGATIVE ng/mL (ref <150)
CREATININE: 84 mg/dL
ETHYL SULFATE (ETS): 308 ng/mL — AB (ref <100)
Ethyl Glucuronide (ETG): 1060 ng/mL — ABNORMAL HIGH (ref <500)
Hydroxyethylflurazepam: NEGATIVE ng/mL (ref <50)
LORAZEPAM: NEGATIVE ng/mL (ref <50)
MDMA: NEGATIVE ng/mL (ref <500)
Marijuana Metabolite: NEGATIVE ng/mL (ref <20)
NORDIAZEPAM: NEGATIVE ng/mL (ref <50)
OPIATES: NEGATIVE ng/mL (ref <100)
Oxazepam: NEGATIVE ng/mL (ref <50)
Oxidant: NEGATIVE ug/mL (ref <200)
Oxycodone: NEGATIVE ng/mL (ref <100)
Temazepam: NEGATIVE ng/mL (ref <50)
pH: 6.56 (ref 4.5–9.0)

## 2017-04-08 ENCOUNTER — Other Ambulatory Visit (INDEPENDENT_AMBULATORY_CARE_PROVIDER_SITE_OTHER): Payer: Medicare Other

## 2017-04-08 DIAGNOSIS — R7989 Other specified abnormal findings of blood chemistry: Secondary | ICD-10-CM | POA: Diagnosis not present

## 2017-04-08 LAB — T4, FREE: Free T4: 0.72 ng/dL (ref 0.60–1.60)

## 2017-04-08 LAB — TSH: TSH: 6.12 u[IU]/mL — AB (ref 0.35–4.50)

## 2017-04-08 LAB — T3, FREE: T3 FREE: 3.3 pg/mL (ref 2.3–4.2)

## 2017-05-04 DIAGNOSIS — I999 Unspecified disorder of circulatory system: Secondary | ICD-10-CM | POA: Diagnosis not present

## 2017-05-04 DIAGNOSIS — N959 Unspecified menopausal and perimenopausal disorder: Secondary | ICD-10-CM | POA: Diagnosis not present

## 2017-05-04 DIAGNOSIS — I7300 Raynaud's syndrome without gangrene: Secondary | ICD-10-CM | POA: Diagnosis not present

## 2017-05-04 DIAGNOSIS — I739 Peripheral vascular disease, unspecified: Secondary | ICD-10-CM | POA: Diagnosis not present

## 2017-05-14 ENCOUNTER — Other Ambulatory Visit: Payer: Self-pay | Admitting: Family Medicine

## 2017-05-14 DIAGNOSIS — Z1231 Encounter for screening mammogram for malignant neoplasm of breast: Secondary | ICD-10-CM

## 2017-06-02 ENCOUNTER — Ambulatory Visit
Admission: RE | Admit: 2017-06-02 | Discharge: 2017-06-02 | Disposition: A | Discharge status: Home / Self Care | Payer: Medicare Other | Source: Ambulatory Visit | Attending: Family Medicine | Admitting: Family Medicine

## 2017-06-02 DIAGNOSIS — Z1231 Encounter for screening mammogram for malignant neoplasm of breast: Secondary | ICD-10-CM | POA: Diagnosis not present

## 2017-06-03 ENCOUNTER — Ambulatory Visit: Payer: Medicare Other | Admitting: Obstetrics and Gynecology

## 2017-06-18 DIAGNOSIS — M533 Sacrococcygeal disorders, not elsewhere classified: Secondary | ICD-10-CM | POA: Diagnosis not present

## 2017-10-06 ENCOUNTER — Telehealth: Payer: Self-pay | Admitting: Family Medicine

## 2017-10-06 ENCOUNTER — Other Ambulatory Visit: Payer: Self-pay | Admitting: Family Medicine

## 2017-10-06 NOTE — Telephone Encounter (Unsigned)
Copied from [place] [id number]. Topic: Quick Communication - Rx Refill/Question >> Oct 06, 2017  4:00 PM Judyann Munson wrote: Medication: HYDROcodone-acetaminophen ([place]) 10-325 MG tablet  Has the patient contacted their pharmacy? no  Preferred Pharmacy (with phone number or street name): [place], [place]. [phone] (Phone) [phone] (Fax)      Agent: Please be advised that RX refills may take up to 3 business days. We ask that you follow-up with your pharmacy.

## 2017-10-06 NOTE — Telephone Encounter (Signed)
See note

## 2017-10-07 NOTE — Telephone Encounter (Signed)
Last fill 03/04/17. Was given 60 tablets for every 6 hours as needed. No refills. Is this refill okay to refill for you?

## 2017-10-07 NOTE — Telephone Encounter (Signed)
Narcotics require office visit for refill

## 2017-10-08 NOTE — Telephone Encounter (Signed)
Patient scheduled for Tomorrow at 4:15

## 2017-10-08 NOTE — Telephone Encounter (Signed)
Team please pend this prescription when patient arrives for her visit tomorrow with me.  Also pend the narcotics she is requesting refill of.

## 2017-10-09 ENCOUNTER — Encounter: Payer: Self-pay | Admitting: Family Medicine

## 2017-10-09 ENCOUNTER — Ambulatory Visit: Payer: Medicare Other | Admitting: Family Medicine

## 2017-10-09 VITALS — BP 122/86 | HR 75 | Temp 97.5°F | Ht 60.08 in | Wt 140.8 lb

## 2017-10-09 DIAGNOSIS — I10 Essential (primary) hypertension: Secondary | ICD-10-CM

## 2017-10-09 DIAGNOSIS — N183 Chronic kidney disease, stage 3 unspecified: Secondary | ICD-10-CM

## 2017-10-09 DIAGNOSIS — G8929 Other chronic pain: Secondary | ICD-10-CM

## 2017-10-09 DIAGNOSIS — R7989 Other specified abnormal findings of blood chemistry: Secondary | ICD-10-CM | POA: Diagnosis not present

## 2017-10-09 DIAGNOSIS — F4322 Adjustment disorder with anxiety: Secondary | ICD-10-CM

## 2017-10-09 DIAGNOSIS — M5441 Lumbago with sciatica, right side: Secondary | ICD-10-CM

## 2017-10-09 DIAGNOSIS — R718 Other abnormality of red blood cells: Secondary | ICD-10-CM

## 2017-10-09 MED ORDER — ALPRAZOLAM 0.5 MG PO TABS: 0.5000 mg | ORAL_TABLET | Freq: Three times a day (TID) | ORAL | 1 refills | Status: DC | PRN

## 2017-10-09 MED ORDER — HYDROCODONE-ACETAMINOPHEN 10-325 MG PO TABS: 1.0000 | ORAL_TABLET | Freq: Four times a day (QID) | ORAL | 0 refills | Status: DC | PRN

## 2017-10-09 NOTE — Patient Instructions (Addendum)
I would also like for you to sign up for an annual wellness visit with one of our nurses, Cassie or Manuela Schwartz, who both specialize in the annual wellness visit. This is a free benefit under medicare that may help [place] find additional ways to help you. Some highlights are reviewing medications, lifestyle, and doing a dementia screen.  Please check with your pharmacy to see if they have the shingrix vaccine. If they do- please get this immunization and update [place] by phone call or mychart with dates you receive the vaccine  Refills for 6 months on hydrocodone and xanax provided- see you back in December for physical- I would go ahead and schedule this because my physicals are booking out about 5 months now  Please call back and get scheduled for labs sometime in next 2 weeks

## 2017-10-09 NOTE — Progress Notes (Signed)
Subjective:  Kari Welch is a 72 y.o. year old very pleasant female patient who presents for/with See problem oriented charting ROS- No chest pain or shortness of breath. no blurry vision.  Has anxiety, low back pain, and headaches at times  Past Medical History-  Patient Active Problem List   Diagnosis Date Noted  . Chronic back pain 11/29/2013    Priority: High  . Hypercoagulable state ([place]) 05/26/2012    Priority: High  . Paroxysmal digital cyanosis 05/26/2012    Priority: High  . Vasculopathy 11/29/2007    Priority: High  . CKD (chronic kidney disease), stage III ([place]) 03/04/2017    Priority: Medium  . Transaminitis 05/02/2015    Priority: Medium  . Adjustment disorder with anxiety 11/29/2013    Priority: Medium  . Claudication ([place]) 05/26/2012    Priority: Medium  . Hyperlipidemia 02/04/2010    Priority: Medium  . Essential hypertension 11/19/2006    Priority: Medium  . Cerumen impaction 03/04/2017    Priority: Low  . Varicose veins of right lower extremity with pain     Priority: Low  . Macrocytosis without anemia 11/29/2013    Priority: Low  . Arthritis 11/15/2015    Medications- reviewed and updated Current Outpatient Medications  Medication Sig Dispense Refill  . albuterol (PROAIR HFA) 108 (90 Base) MCG/ACT inhaler Inhale 1-2 puffs into the lungs every 6 (six) hours as needed for wheezing or shortness of breath. 1 Inhaler 3  . ALPRAZolam (XANAX) 0.5 MG tablet Take 1 tablet (0.5 mg total) by mouth every 8 (eight) hours as needed for anxiety (uses about 10 a month). 30 tablet 1  . amLODipine (NORVASC) 5 MG tablet Take 1 tablet by mouth 2 (two) times daily.     Marland Kitchen aspirin (BABY ASPIRIN) 81 MG chewable tablet Chew 81 mg by mouth daily.      . cilostazol (PLETAL) 100 MG tablet Take 100 mg by mouth 2 (two) times daily.      Marland Kitchen FLUoxetine (PROZAC) 40 MG capsule TAKE (1) CAPSULE DAILY. 90 capsule 0  . HYDROcodone-acetaminophen (NORCO) 10-325 MG tablet Take 1 tablet by  mouth every 6 (six) hours as needed. 60 tablet 0  . isosorbide mononitrate (IMDUR) 60 MG 24 hr tablet Take 60 mg by mouth daily.      . potassium chloride SA (K-DUR,KLOR-CON) 20 MEQ tablet TAKE 1 TABLET DAILY. 30 tablet 0  . rosuvastatin (CRESTOR) 10 MG tablet Take 1 tablet by mouth daily.    Marland Kitchen triamterene-hydrochlorothiazide (MAXZIDE-25) 37.5-25 MG per tablet Take 1 tablet by mouth daily.     No current facility-administered medications for this visit.     Objective: BP 122/86 (BP Location: Left Arm, Patient Position: Sitting, Cuff Size: Normal)   Pulse 75   Temp (!) 97.5 F (36.4 C) (Oral)   Ht 5' 0.08" (1.526 m)   Wt 140 lb 12.8 oz (63.9 kg)   LMP 03/31/1977   SpO2 97%   BMI 27.43 kg/m  Gen: NAD, resting comfortably CV: RRR no murmurs rubs or gallops Lungs: CTAB no crackles, wheeze, rhonchi Abdomen: soft/nontender/nondistended/normal bowel sounds.  Ext: no edema Skin: warm, dry Neuro: normal speech  Assessment/Plan:  Other notes: 1.high MCV- update labs 2. High TSH- make sure not tending up with TSH. She will return for labs since our lab closed by the time I was seeing patient today   Chronic back pain S: Patient continues on hydrocodone 10/325 and uses about 15-20 a month max (at present she  used #60  Over a 6 month periods). She has een told to avoid ibuprofen and sumatriptan for migraines given vascular issues- she also uses hydrocodone for her migraines A/P: refilled hydrocodone #60 with hopes of lasting 6 months -NCCSRS reviewed and only rx through me - knows never to use hydrocodone and xanax within 8 hours of each other - UDS and controlled substance contract 03/04/17 - discussed have to do refills in office now  Essential hypertension S: controlled on amlodipine 5mg , imdur 60mg , maxzide 25mg  BP Readings from Last 3 Encounters:  10/09/17 122/86  03/04/17 110/80  05/26/16 104/70  A/P: We discussed blood pressure goal of <140/90. Continue current  meds  Adjustment disorder with anxiety S: remains on prozac 40mg , uses xanax for anxiety about 10 per month per prior conversation.s 2 months ago filled #30 so use appears slightly higher. beofre that 30 pills lasted 6 months.   A/P: Refilled xanax- #120 over last year- hopeful #60 lasts her 6 months this time  CKD (chronic kidney disease), stage III ([place]) Knows to avoid nsaids. BP at goal. Lipids reasonably controlled. Update bmet today   encourged her to call in to schedule physical 1 year out from last physical or December if hasnt had one  Lab/Order associations: Elevated TSH - Plan: TSH  Elevated MCV - Plan: Vitamin B12, Folate RBC, Methylmalonic Acid, CBC  CKD (chronic kidney disease), stage III ([place]) - Plan: Basic metabolic panel  Chronic bilateral low back pain with right-sided sciatica  Essential hypertension  Adjustment disorder with anxiety  Meds ordered this encounter  Medications  . HYDROcodone-acetaminophen (NORCO) 10-325 MG tablet    Sig: Take 1 tablet by mouth every 6 (six) hours as needed.    Dispense:  60 tablet    Refill:  0  . ALPRAZolam (XANAX) 0.5 MG tablet    Sig: Take 1 tablet (0.5 mg total) by mouth every 8 (eight) hours as needed for anxiety (uses about 10 a month).    Dispense:  30 tablet    Refill:  1    Return precautions advised.  Garret Reddish, MD

## 2017-10-10 NOTE — Assessment & Plan Note (Signed)
Knows to avoid nsaids. BP at goal. Lipids reasonably controlled. Update bmet today

## 2017-10-10 NOTE — Assessment & Plan Note (Signed)
S: Patient continues on hydrocodone 10/325 and uses about 15-20 a month max (at present she used #60  Over a 6 month periods). She has een told to avoid ibuprofen and sumatriptan for migraines given vascular issues- she also uses hydrocodone for her migraines A/P: refilled hydrocodone #60 with hopes of lasting 6 months -NCCSRS reviewed and only rx through me - knows never to use hydrocodone and xanax within 8 hours of each other - UDS and controlled substance contract 03/04/17 - discussed have to do refills in office now

## 2017-10-10 NOTE — Assessment & Plan Note (Addendum)
S: remains on prozac 40mg , uses xanax for anxiety about 10 per month per prior conversation.s 2 months ago filled #30 so use appears slightly higher. beofre that 30 pills lasted 6 months.  Gad7 well controlled at 2 today.  A/P: Refilled xanax- #120 over last year- hopeful #60 lasts her 6 months this time

## 2017-10-10 NOTE — Assessment & Plan Note (Signed)
S: controlled on amlodipine 5mg , imdur 60mg , maxzide 25mg  BP Readings from Last 3 Encounters:  10/09/17 122/86  03/04/17 110/80  05/26/16 104/70  A/P: We discussed blood pressure goal of <140/90. Continue current meds

## 2017-10-11 ENCOUNTER — Encounter: Payer: Self-pay | Admitting: Family Medicine

## 2017-10-27 ENCOUNTER — Other Ambulatory Visit (INDEPENDENT_AMBULATORY_CARE_PROVIDER_SITE_OTHER): Payer: Medicare Other

## 2017-10-27 DIAGNOSIS — N183 Chronic kidney disease, stage 3 unspecified: Secondary | ICD-10-CM

## 2017-10-27 DIAGNOSIS — R718 Other abnormality of red blood cells: Secondary | ICD-10-CM | POA: Diagnosis not present

## 2017-10-27 DIAGNOSIS — R7989 Other specified abnormal findings of blood chemistry: Secondary | ICD-10-CM | POA: Diagnosis not present

## 2017-10-27 LAB — BASIC METABOLIC PANEL
BUN: 19 mg/dL (ref 6–23)
CHLORIDE: 103 meq/L (ref 96–112)
CO2: 27 mEq/L (ref 19–32)
CREATININE: 1.16 mg/dL (ref 0.40–1.20)
Calcium: 9.6 mg/dL (ref 8.4–10.5)
GFR: 49.12 mL/min — ABNORMAL LOW (ref >60.00)
Glucose, Bld: 104 mg/dL — ABNORMAL HIGH (ref 70–99)
POTASSIUM: 3.3 meq/L — AB (ref 3.5–5.1)
Sodium: 139 mEq/L (ref 135–145)

## 2017-10-27 LAB — CBC
HEMATOCRIT: 43.1 % (ref 36.0–46.0)
HEMOGLOBIN: 14.6 g/dL (ref 12.0–15.0)
MCHC: 33.8 g/dL (ref 30.0–36.0)
MCV: 105.7 fl — AB (ref 78.0–100.0)
Platelets: 348 10*3/uL (ref 150.0–400.0)
RBC: 4.08 Mil/uL (ref 3.87–5.11)
RDW: 14.3 % (ref 11.5–15.5)
WBC: 4.5 10*3/uL (ref 4.0–10.5)

## 2017-10-27 LAB — TSH: TSH: 6.2 u[IU]/mL — ABNORMAL HIGH (ref 0.35–4.50)

## 2017-10-27 LAB — VITAMIN B12: Vitamin B-12: 331 pg/mL (ref 211–911)

## 2017-10-28 ENCOUNTER — Encounter: Payer: Self-pay | Admitting: Family Medicine

## 2017-10-28 ENCOUNTER — Other Ambulatory Visit: Payer: Medicare Other

## 2017-10-29 ENCOUNTER — Encounter: Payer: Self-pay | Admitting: Gastroenterology

## 2017-10-30 LAB — FOLATE RBC: RBC Folate: 576 ng/mL RBC (ref >280)

## 2017-10-30 LAB — METHYLMALONIC ACID, SERUM: Methylmalonic Acid, Quant: 340 nmol/L — ABNORMAL HIGH (ref 87–318)

## 2018-03-10 ENCOUNTER — Encounter: Payer: Self-pay | Admitting: Family Medicine

## 2018-03-10 ENCOUNTER — Ambulatory Visit: Payer: Medicare Other | Admitting: Family Medicine

## 2018-03-10 ENCOUNTER — Encounter

## 2018-03-10 VITALS — BP 108/68 | HR 66 | Temp 97.5°F | Ht 60.0 in | Wt 141.4 lb

## 2018-03-10 DIAGNOSIS — Z23 Encounter for immunization: Secondary | ICD-10-CM

## 2018-03-10 DIAGNOSIS — R7989 Other specified abnormal findings of blood chemistry: Secondary | ICD-10-CM

## 2018-03-10 DIAGNOSIS — N183 Chronic kidney disease, stage 3 unspecified: Secondary | ICD-10-CM

## 2018-03-10 DIAGNOSIS — G8929 Other chronic pain: Secondary | ICD-10-CM

## 2018-03-10 DIAGNOSIS — I10 Essential (primary) hypertension: Secondary | ICD-10-CM | POA: Diagnosis not present

## 2018-03-10 DIAGNOSIS — E039 Hypothyroidism, unspecified: Secondary | ICD-10-CM | POA: Diagnosis not present

## 2018-03-10 DIAGNOSIS — D7589 Other specified diseases of blood and blood-forming organs: Secondary | ICD-10-CM | POA: Diagnosis not present

## 2018-03-10 DIAGNOSIS — D6859 Other primary thrombophilia: Secondary | ICD-10-CM | POA: Diagnosis not present

## 2018-03-10 DIAGNOSIS — M5441 Lumbago with sciatica, right side: Secondary | ICD-10-CM

## 2018-03-10 DIAGNOSIS — F4322 Adjustment disorder with anxiety: Secondary | ICD-10-CM

## 2018-03-10 DIAGNOSIS — Z6827 Body mass index (BMI) 27.0-27.9, adult: Secondary | ICD-10-CM

## 2018-03-10 LAB — COMPREHENSIVE METABOLIC PANEL
ALK PHOS: 49 U/L (ref 39–117)
ALT: 18 U/L (ref 0–35)
AST: 22 U/L (ref 0–37)
Albumin: 4.3 g/dL (ref 3.5–5.2)
BUN: 32 mg/dL — ABNORMAL HIGH (ref 6–23)
CO2: 26 mEq/L (ref 19–32)
CREATININE: 1.35 mg/dL — AB (ref 0.40–1.20)
Calcium: 9.9 mg/dL (ref 8.4–10.5)
Chloride: 101 mEq/L (ref 96–112)
GFR: 41.19 mL/min — ABNORMAL LOW (ref >60.00)
GLUCOSE: 86 mg/dL (ref 70–99)
POTASSIUM: 3.7 meq/L (ref 3.5–5.1)
Sodium: 136 mEq/L (ref 135–145)
Total Bilirubin: 0.4 mg/dL (ref 0.2–1.2)
Total Protein: 7.4 g/dL (ref 6.0–8.3)

## 2018-03-10 LAB — VITAMIN B12: Vitamin B-12: 1483 pg/mL — ABNORMAL HIGH (ref 211–911)

## 2018-03-10 LAB — LIPID PANEL
Cholesterol: 184 mg/dL (ref 0–200)
HDL: 83.6 mg/dL (ref >39.00)
LDL Cholesterol: 89 mg/dL (ref 0–99)
NonHDL: 100.52
TRIGLYCERIDES: 56 mg/dL (ref 0.0–149.0)
Total CHOL/HDL Ratio: 2
VLDL: 11.2 mg/dL (ref 0.0–40.0)

## 2018-03-10 LAB — TSH: TSH: 4.85 u[IU]/mL — AB (ref 0.35–4.50)

## 2018-03-10 LAB — CBC
HEMATOCRIT: 45 % (ref 36.0–46.0)
HEMOGLOBIN: 15 g/dL (ref 12.0–15.0)
MCHC: 33.3 g/dL (ref 30.0–36.0)
MCV: 105.8 fl — ABNORMAL HIGH (ref 78.0–100.0)
PLATELETS: 370 10*3/uL (ref 150.0–400.0)
RBC: 4.25 Mil/uL (ref 3.87–5.11)
RDW: 13.6 % (ref 11.5–15.5)
WBC: 5.4 10*3/uL (ref 4.0–10.5)

## 2018-03-10 MED ORDER — ALBUTEROL SULFATE HFA 108 (90 BASE) MCG/ACT IN AERS: 1.0000 | INHALATION_SPRAY | Freq: Four times a day (QID) | RESPIRATORY_TRACT | 3 refills | Status: DC | PRN

## 2018-03-10 NOTE — Assessment & Plan Note (Signed)
S: Reasonable control on Prozac 40 mg.  Also uses about 10 Xanax a month for anxiety not controlled by the Prozac.  Last visit 4 months ago provided #120 with hopes that lasts absolute minimum of 6 months. She picked up #30 on 01/26/18 A/P:  Stable continue current meds- can refill up to 6 months.

## 2018-03-10 NOTE — Assessment & Plan Note (Signed)
S: controlled on amlodipine 5 mg, Imdur 60 mg, Maxide 25 mg BP Readings from Last 3 Encounters:  03/10/18 108/68  10/09/17 122/86  03/04/17 110/80  A/P: We discussed blood pressure goal of <140/90. Continue current med

## 2018-03-10 NOTE — Patient Instructions (Addendum)
Health Maintenance Due  Topic Date Due  . INFLUENZA VACCINE - thanks for doing this today 10/29/2017   Team update phq2 please  No changes in meds today- possibly start low dose thyroid medicine with 6 week follow up depending on labs   Please stop by lab before you go

## 2018-03-10 NOTE — Assessment & Plan Note (Signed)
S: Patient continues on hydrocodone 10/325 mg- back has been doing well lately fortunately- lidocaine patches helping. ALSO uses for migraines.  Typically using 15-20 a month maximum- less than that lately.  Most recently-10 a month or less lately  A/P:  STABLE migraines and back pain refilled hydrocodone #60 with hopes of lasting 6 months- she picked up #28 on 10/09/17 and 36 on 10/19/17.  -NCCSRS reviewed and only rx through me - knows never to use hydrocodone and xanax within 8 hours of each other  - UDS and controlled substance contract 03/04/17-order UDS - since we are updating everything today- I would be ok with 1 more refill by phone/mychart to get her to 6 month visit

## 2018-03-10 NOTE — Progress Notes (Signed)
Subjective:  Kari Welch is a 72 y.o. year old very pleasant female patient who presents for/with See problem oriented charting ROS- back pain, finger pain. No chest pain or shortness of breath reported. Some constipatio and slight weight gain   Past Medical History-  Patient Active Problem List   Diagnosis Date Noted  . Chronic back pain 11/29/2013    Priority: High  . Hypercoagulable state ([place]) 05/26/2012    Priority: High  . Paroxysmal digital cyanosis 05/26/2012    Priority: High  . Vasculopathy 11/29/2007    Priority: High  . CKD (chronic kidney disease), stage III ([place]) 03/04/2017    Priority: Medium  . Transaminitis 05/02/2015    Priority: Medium  . Adjustment disorder with anxiety 11/29/2013    Priority: Medium  . Claudication ([place]) 05/26/2012    Priority: Medium  . Hyperlipidemia 02/04/2010    Priority: Medium  . Essential hypertension 11/19/2006    Priority: Medium  . Cerumen impaction 03/04/2017    Priority: Low  . Varicose veins of right lower extremity with pain     Priority: Low  . Macrocytosis without anemia 11/29/2013    Priority: Low  . Hypothyroidism 03/10/2018  . Arthritis 11/15/2015    Medications- reviewed and updated Current Outpatient Medications  Medication Sig Dispense Refill  . albuterol (PROAIR HFA) 108 (90 Base) MCG/ACT inhaler Inhale 1-2 puffs into the lungs every 6 (six) hours as needed for wheezing or shortness of breath. 1 Inhaler 3  . ALPRAZolam (XANAX) 0.5 MG tablet Take 1 tablet (0.5 mg total) by mouth every 8 (eight) hours as needed for anxiety (uses about 10 a month). 30 tablet 1  . amLODipine (NORVASC) 5 MG tablet Take 1 tablet by mouth 2 (two) times daily.     Marland Kitchen aspirin (BABY ASPIRIN) 81 MG chewable tablet Chew 81 mg by mouth daily.      . cilostazol (PLETAL) 100 MG tablet Take 100 mg by mouth 2 (two) times daily.      . cyanocobalamin 1000 MCG tablet Take 1,000 mcg by mouth daily.    Marland Kitchen FLUoxetine (PROZAC) 40 MG capsule TAKE (1)  CAPSULE DAILY. 90 capsule 0  . HYDROcodone-acetaminophen (NORCO) 10-325 MG tablet Take 1 tablet by mouth every 6 (six) hours as needed. 60 tablet 0  . isosorbide mononitrate (IMDUR) 60 MG 24 hr tablet Take 60 mg by mouth daily.      . potassium chloride SA (K-DUR,KLOR-CON) 20 MEQ tablet TAKE 1 TABLET DAILY. 30 tablet 0  . rosuvastatin (CRESTOR) 10 MG tablet Take 1 tablet by mouth daily.    Marland Kitchen triamterene-hydrochlorothiazide (MAXZIDE-25) 37.5-25 MG per tablet Take 1 tablet by mouth daily.     No current facility-administered medications for this visit.     Objective: BP 108/68 (BP Location: Left Arm, Patient Position: Sitting, Cuff Size: Large)   Pulse 66   Temp (!) 97.5 F (36.4 C) (Oral)   Ht 5' (1.524 m)   Wt 141 lb 6.4 oz (64.1 kg)   LMP 03/31/1977   SpO2 94%   BMI 27.62 kg/m  Gen: NAD, resting comfortably No thyromegaly CV: RRR no murmurs rubs or gallops Lungs: CTAB no crackles, wheeze, rhonchi Abdomen: soft/nontender/nondistended/normal bowel sounds.  Ext: no edema Skin: warm, dry Neuro: grossly normal, moves all extremities  Clinical breast exam normal today- requested per husband who is retired neurologist  Assessment/Plan:  Other notes: 1.on and off pain for last year- potting and right 2nd and 3rd MCP joints- has also had  some trauma. No morning stiffness. Has some voltaren samples she can try. Declines x-ray for now- can come back by later if would like to.  2. Somewhat reactive airways- uses albuterol sparingly and clears up- never diagnosed with asthma 3. Colon cancer in mom in late 70s. They remember 7 year discussion with Dr. Olevia Perches- they are stretching to 7 years. No blood in stool. Decline stool cards today- I updated HM to 7 years 4. Flu shot today  5. hypercoaguable state- follows up with [facility] soon and wants full updated labs  Macrocytosis without anemia S: MCV up to 105 last visit-did notice methylmalonic acid high so started on vitamin B12. Taking 1000  mcg b12 now.   Has had for years and years  A/P: We will update CBC, B12, methylmalonic acid-hopeful improving. update pathology smear review   After visit-patient/husband requested I canceled the pathology smear review due to potential cost and in light of chronicity of macrocytosis which is been present well over 20 years- I agreed to cancel and lab stated they would do this for [place].  Essential hypertension S: controlled on amlodipine 5 mg, Imdur 60 mg, Maxide 25 mg BP Readings from Last 3 Encounters:  03/10/18 108/68  10/09/17 122/86  03/04/17 110/80  A/P: We discussed blood pressure goal of <140/90. Continue current med  Adjustment disorder with anxiety S: Reasonable control on Prozac 40 mg.  Also uses about 10 Xanax a month for anxiety not controlled by the Prozac.  Last visit 4 months ago provided #120 with hopes that lasts absolute minimum of 6 months. She picked up #30 on 01/26/18 A/P:  Stable continue current meds- can refill up to 6 months.   CKD (chronic kidney disease), stage III ([place]) S: Patient knows to avoid NSAIDs.  Her GFR has been stable around 50.  Blood pressure controlled and lipids reasonably controlled. A/P: suspect stable- update GFR today   Chronic back pain S: Patient continues on hydrocodone 10/325 mg- back has been doing well lately fortunately- lidocaine patches helping. ALSO uses for migraines.  Typically using 15-20 a month maximum- less than that lately.  Most recently-10 a month or less lately  A/P:  STABLE migraines and back pain refilled hydrocodone #60 with hopes of lasting 6 months- she picked up #28 on 10/09/17 and 36 on 10/19/17.  -NCCSRS reviewed and only rx through me - knows never to use hydrocodone and xanax within 8 hours of each other  - UDS and controlled substance contract 03/04/17-order UDS - since we are updating everything today- I would be ok with 1 more refill by phone/mychart to get her to 6 month visit  Hypothyroidism S:. Possible  hypothyroid symptom- some constipation in recent years and weight gain- she would prefer to treat.  A/P:She wants to treat if above 4.5 at this point due to potentially being symptomatic   Follow-up is likely PRN depending on need for pain medication or Xanax  Lab/Order associations: Need for prophylactic vaccination and inoculation against influenza - Plan: Flu vaccine HIGH DOSE PF  Macrocytosis without anemia - Plan: CBC, Vitamin B12, Methylmalonic Acid, Pathologist smear review  Elevated TSH - Plan: TSH  Chronic bilateral low back pain with right-sided sciatica - Plan: Pain Mgmt, Profile 8 w/Conf, U  Hypercoagulable state ([place]) - Plan: Comprehensive metabolic panel, Lipid panel  CKD (chronic kidney disease), stage III ([place]) - Plan: Comprehensive metabolic panel, Lipid panel  Essential hypertension - Plan: Comprehensive metabolic panel, Lipid panel  Meds ordered this encounter  Medications  . albuterol (PROAIR HFA) 108 (90 Base) MCG/ACT inhaler    Sig: Inhale 1-2 puffs into the lungs every 6 (six) hours as needed for wheezing or shortness of breath.    Dispense:  1 Inhaler    Refill:  3    Return precautions advised.  Garret Reddish, MD

## 2018-03-10 NOTE — Assessment & Plan Note (Signed)
S:. Possible hypothyroid symptom- some constipation in recent years and weight gain- she would prefer to treat.  A/P:She wants to treat if above 4.5 at this point due to potentially being symptomatic

## 2018-03-10 NOTE — Assessment & Plan Note (Signed)
S: Patient knows to avoid NSAIDs.  Her GFR has been stable around 50.  Blood pressure controlled and lipids reasonably controlled. A/P: suspect stable- update GFR today

## 2018-03-10 NOTE — Assessment & Plan Note (Addendum)
S: MCV up to 105 last visit-did notice methylmalonic acid high so started on vitamin B12. Taking 1000 mcg b12 now.   Has had for years and years  A/P: We will update CBC, B12, methylmalonic acid-hopeful improving. update pathology smear review   After visit-patient/husband requested I canceled the pathology smear review due to potential cost and in light of chronicity of macrocytosis which is been present well over 20 years- I agreed to cancel and lab stated they would do this for [place].

## 2018-03-11 LAB — PAIN MGMT, PROFILE 8 W/CONF, U
6 Acetylmorphine: NEGATIVE ng/mL (ref <10)
AMPHETAMINES: NEGATIVE ng/mL (ref <500)
Alcohol Metabolites: NEGATIVE ng/mL (ref <500)
BENZODIAZEPINES: NEGATIVE ng/mL (ref <100)
Buprenorphine, Urine: NEGATIVE ng/mL (ref <5)
COCAINE METABOLITE: NEGATIVE ng/mL (ref <150)
Creatinine: 116.6 mg/dL
MDMA: NEGATIVE ng/mL (ref <500)
Marijuana Metabolite: NEGATIVE ng/mL (ref <20)
OXYCODONE: NEGATIVE ng/mL (ref <100)
Opiates: NEGATIVE ng/mL (ref <100)
Oxidant: NEGATIVE ug/mL (ref <200)
pH: 6.43 (ref 4.5–9.0)

## 2018-03-13 LAB — METHYLMALONIC ACID, SERUM: Methylmalonic Acid, Quant: 287 nmol/L (ref 87–318)

## 2018-04-20 ENCOUNTER — Other Ambulatory Visit: Payer: Medicare Other

## 2018-04-22 DIAGNOSIS — Z0120 Encounter for dental examination and cleaning without abnormal findings: Secondary | ICD-10-CM | POA: Diagnosis not present

## 2018-04-27 ENCOUNTER — Other Ambulatory Visit (INDEPENDENT_AMBULATORY_CARE_PROVIDER_SITE_OTHER): Payer: Medicare Other

## 2018-04-27 DIAGNOSIS — N183 Chronic kidney disease, stage 3 unspecified: Secondary | ICD-10-CM

## 2018-04-27 LAB — BASIC METABOLIC PANEL
BUN: 18 mg/dL (ref 6–23)
CALCIUM: 9.7 mg/dL (ref 8.4–10.5)
CHLORIDE: 98 meq/L (ref 96–112)
CO2: 29 meq/L (ref 19–32)
Creatinine, Ser: 0.99 mg/dL (ref 0.40–1.20)
GFR: 55.41 mL/min — AB (ref >60.00)
Glucose, Bld: 96 mg/dL (ref 70–99)
POTASSIUM: 4.1 meq/L (ref 3.5–5.1)
Sodium: 134 mEq/L — ABNORMAL LOW (ref 135–145)

## 2018-05-17 DIAGNOSIS — I10 Essential (primary) hypertension: Secondary | ICD-10-CM | POA: Diagnosis not present

## 2018-05-17 DIAGNOSIS — I7300 Raynaud's syndrome without gangrene: Secondary | ICD-10-CM | POA: Diagnosis not present

## 2018-05-17 DIAGNOSIS — I872 Venous insufficiency (chronic) (peripheral): Secondary | ICD-10-CM | POA: Diagnosis not present

## 2018-05-17 DIAGNOSIS — I999 Unspecified disorder of circulatory system: Secondary | ICD-10-CM | POA: Diagnosis not present

## 2018-06-09 ENCOUNTER — Other Ambulatory Visit: Payer: Self-pay | Admitting: Family Medicine

## 2018-06-09 DIAGNOSIS — Z1231 Encounter for screening mammogram for malignant neoplasm of breast: Secondary | ICD-10-CM

## 2018-06-21 ENCOUNTER — Other Ambulatory Visit: Payer: Self-pay | Admitting: Family Medicine

## 2018-06-22 NOTE — Telephone Encounter (Signed)
Last OV 03/10/2018 Last refill Alprazolam 10/09/2017 #30/1                 Hydrocodone-APAP 10/09/2017 #60/0 Next OV not scheduled  Forwarding to Dr. Yong Channel.

## 2018-07-08 ENCOUNTER — Ambulatory Visit: Payer: Medicare Other

## 2018-08-18 ENCOUNTER — Ambulatory Visit
Admission: RE | Admit: 2018-08-18 | Discharge: 2018-08-18 | Disposition: A | Discharge status: Home / Self Care | Payer: Medicare Other | Source: Ambulatory Visit | Attending: Family Medicine | Admitting: Family Medicine

## 2018-08-18 ENCOUNTER — Other Ambulatory Visit: Payer: Self-pay

## 2018-08-18 DIAGNOSIS — Z1231 Encounter for screening mammogram for malignant neoplasm of breast: Secondary | ICD-10-CM

## 2018-08-19 ENCOUNTER — Encounter: Payer: Self-pay | Admitting: Family Medicine

## 2018-08-24 ENCOUNTER — Ambulatory Visit: Payer: Medicare Other

## 2018-10-18 ENCOUNTER — Other Ambulatory Visit: Payer: Self-pay | Admitting: Family Medicine

## 2018-10-19 NOTE — Telephone Encounter (Signed)
Narcotics require office visit to help [place] comply with state policy

## 2018-10-19 NOTE — Telephone Encounter (Signed)
Last OV 03/10/2018 Last refill Alprazolam 06/22/18 #30/0                 Hydrocodone-APAP 06/22/18 #36/0 Next OV not scheduled

## 2018-10-20 ENCOUNTER — Other Ambulatory Visit: Payer: Self-pay | Admitting: Family Medicine

## 2018-10-20 NOTE — Telephone Encounter (Signed)
Please contact pt to schedule med f/u visit. Refills can be discussed at the time of visit. Thanks!

## 2018-10-20 NOTE — Telephone Encounter (Signed)
Patient has been scheduled for a doxy appt on 11/03/2018 at 11:20 AM.

## 2018-10-25 DIAGNOSIS — Z0120 Encounter for dental examination and cleaning without abnormal findings: Secondary | ICD-10-CM | POA: Diagnosis not present

## 2018-11-03 ENCOUNTER — Encounter: Payer: Self-pay | Admitting: Family Medicine

## 2018-11-03 ENCOUNTER — Ambulatory Visit (INDEPENDENT_AMBULATORY_CARE_PROVIDER_SITE_OTHER): Payer: Medicare Other | Admitting: Family Medicine

## 2018-11-03 VITALS — BP 110/60 | HR 66 | Ht 60.0 in | Wt 137.0 lb

## 2018-11-03 DIAGNOSIS — N183 Chronic kidney disease, stage 3 unspecified: Secondary | ICD-10-CM

## 2018-11-03 DIAGNOSIS — I10 Essential (primary) hypertension: Secondary | ICD-10-CM | POA: Diagnosis not present

## 2018-11-03 DIAGNOSIS — E785 Hyperlipidemia, unspecified: Secondary | ICD-10-CM

## 2018-11-03 DIAGNOSIS — D6859 Other primary thrombophilia: Secondary | ICD-10-CM

## 2018-11-03 DIAGNOSIS — I739 Peripheral vascular disease, unspecified: Secondary | ICD-10-CM

## 2018-11-03 MED ORDER — ALPRAZOLAM 0.5 MG PO TABS
ORAL_TABLET | ORAL | 0 refills | Status: DC
Start: 2018-11-03 — End: 2019-03-15

## 2018-11-03 MED ORDER — HYDROCODONE-ACETAMINOPHEN 10-325 MG PO TABS: ORAL_TABLET | ORAL | 0 refills | Status: DC

## 2018-11-03 NOTE — Patient Instructions (Addendum)
Health Maintenance Due  Topic Date Due  . Samul Dada - will have done at pharmacy 03/31/2018  . INFLUENZA VACCINE  10/30/2018    Depression screen [facility] 2/9 03/04/2017 05/02/2015 04/19/2013  Decreased Interest 0 0 0  Down, Depressed, Hopeless 0 0 0  PHQ - 2 Score 0 0 0

## 2018-11-03 NOTE — Progress Notes (Signed)
Phone [phone]   Subjective:  Virtual visit via Video note. Chief complaint: Chief Complaint  Patient presents with  . Follow-up  . Hypertension  . Chronic Kidney Disease  . Hypothyroidism  . Hyperlipidemia  . Anxiety    This visit type was conducted due to national recommendations for restrictions regarding the COVID-19 Pandemic (e.g. social distancing).  This format is felt to be most appropriate for this patient at this time balancing risks to patient and risks to population by having him in for in person visit.  No physical exam was performed (except for noted visual exam or audio findings with Telehealth visits).    Our team/I connected with Maryelizabeth Rowan Nakanishi at 11:20 AM EDT by a video enabled telemedicine application ([email or web address] or caregility through epic) and verified that I am speaking with the correct person using two identifiers.  Location patient: [place] Location provider: [facility], office Persons participating in the virtual visit:  patient  Our team/I discussed the limitations of evaluation and management by telemedicine and the availability of in person appointments. In light of current covid-19 pandemic, patient also understands that we are trying to protect them by minimizing in office contact if at all possible.  The patient expressed consent for telemedicine visit and agreed to proceed. Patient understands insurance will be billed.   ROS- Denies dizziness, CP, SOB, visual changes.   Past Medical History-  Patient Active Problem List   Diagnosis Date Noted  . Chronic back pain 11/29/2013    Priority: High  . Hypercoagulable state ([place]) 05/26/2012    Priority: High  . Paroxysmal digital cyanosis 05/26/2012    Priority: High  . Vasculopathy 11/29/2007    Priority: High  . CKD (chronic kidney disease), stage III ([place]) 03/04/2017    Priority: Medium  . Transaminitis 05/02/2015    Priority: Medium  . Adjustment disorder with anxiety 11/29/2013    Priority:  Medium  . Intermittent claudication ([place]) 05/26/2012    Priority: Medium  . Hyperlipidemia 02/04/2010    Priority: Medium  . Essential hypertension 11/19/2006    Priority: Medium  . Cerumen impaction 03/04/2017    Priority: Low  . Varicose veins of right lower extremity with pain     Priority: Low  . Macrocytosis without anemia 11/29/2013    Priority: Low  . Hypothyroidism 03/10/2018  . Arthritis 11/15/2015    Medications- reviewed and updated Current Outpatient Medications  Medication Sig Dispense Refill  . albuterol (PROAIR HFA) 108 (90 Base) MCG/ACT inhaler Inhale 1-2 puffs into the lungs every 6 (six) hours as needed for wheezing or shortness of breath. 1 Inhaler 3  . ALPRAZolam (XANAX) 0.5 MG tablet TAKE 1 TABLET EVERY 8 HOURS AS NEEDED FOR ANXIETY. 30 tablet 0  . amLODipine (NORVASC) 5 MG tablet Take 1 tablet by mouth 2 (two) times daily.     Marland Kitchen aspirin (BABY ASPIRIN) 81 MG chewable tablet Chew 81 mg by mouth daily.      . cilostazol (PLETAL) 100 MG tablet Take 100 mg by mouth 2 (two) times daily.      . cyanocobalamin 1000 MCG tablet Take 1,000 mcg by mouth daily.    Marland Kitchen FLUoxetine (PROZAC) 40 MG capsule TAKE (1) CAPSULE DAILY. 90 capsule 0  . HYDROcodone-acetaminophen (NORCO) 10-325 MG tablet TAKE ONE TABLET EVERY 6 HOURS AS NEEDED. 36 tablet 0  . isosorbide mononitrate (IMDUR) 60 MG 24 hr tablet Take 60 mg by mouth daily.      . potassium chloride SA (K-DUR,KLOR-CON) 20  MEQ tablet TAKE 1 TABLET DAILY. 30 tablet 0  . rosuvastatin (CRESTOR) 10 MG tablet Take 1 tablet by mouth daily.    Marland Kitchen triamterene-hydrochlorothiazide (MAXZIDE-25) 37.5-25 MG per tablet Take 1 tablet by mouth daily.     No current facility-administered medications for this visit.      Objective:  BP 110/60   Pulse 66   Ht 5' (1.524 m)   Wt 137 lb (62.1 kg)   LMP 03/31/1977   BMI 26.76 kg/m  self reported vitals Gen: NAD, resting comfortably Lungs: nonlabored, normal respiratory rate Skin: appears dry,  no obvious rash     Assessment and Plan   #social distancing- doing a really good job- staying at home as much as possible- wearing mask when out.   #hypertension/CKD S: controlled on Amlodipine 5 mg BID, Isosorbide 60 mg daily, Triamterene-HCTZ 37.5-25 mg daily, and K-Dur 20 MEQ daily. Tolerating meds well. Checking BP at home, staying consistently below 140/90.  Hx of migraines. Following low sodium diet.  BP Readings from Last 3 Encounters:  11/03/18 110/60  03/10/18 108/68  10/09/17 122/86  A/P: Blood pressure control/stable. Continue current medications.  CKD stage III likely stable- continue to avoid NSAIDs.  Patient wishes to defer BMP until follow-up in December due to COVID-19  #Elevated TSH S: Not on thyroid medication.  Lab Results  Component Value Date   TSH 4.85 (H) 03/10/2018   A/P: Offered repeat TSH at this time-prefers to wait until December visit- if TSH gets above 10 consider treatment  #hyperlipidemia S: Previously controlled on Rosuvastatin 10 mg every other day. Also taking Aspirin 81 mg daily. Tolerating well.  Lab Results  Component Value Date   CHOL 184 03/10/2018   HDL 83.60 03/10/2018   LDLCALC 89 03/10/2018   LDLDIRECT 61.0 05/02/2015   TRIG 56.0 03/10/2018   CHOLHDL 2 03/10/2018   A/P: Hopefully controlled-update lipid panel at physical  # Anxiety S:Taking Fluoxetine 40 mg daily and Alprazolam 0.5 mg prn.  Sx are well controlled. Taking Alprazolam a few times monthly. Knows to separate alprazolam from hydcodone from 8 hours A/P: Reasonable control-last alprazolam refill was in March-refilled today.  NCCSRS reviewed   # Claudication/hypercoagulable state S:Taking Cilostazol  100 mg BID.  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 Sx flare up with weather changes. Has been having more migraines recently. When she last had her Hydrocodone refilled, she only received 28 pills d/t insurance coverage.  A/P: Has to avoid NSAIDs and triptans due to underlying vascular issues as above-continue hydrocodone sparingly for refills-refilled 36 but she states per insurance sometimes only able to get 28-last refill was in March per [facility] -UDS up-to-date March 10, 2018 -Controlled substance contract signed 2018  Recommended follow up: Already scheduled for December visit Future Appointments  Date Time Provider [place]  03/15/2019 10:40 AM Marin Olp, MD [facility]    Lab/Order associations:   ICD-10-CM   1. Essential hypertension  I10   2. CKD (chronic kidney disease), stage III (HCC)  N18.3   3. Hyperlipidemia, unspecified hyperlipidemia type  E78.5   4. Hypercoagulable state ([place]) Chronic D68.59   5. Intermittent claudication (HCC)  I73.9  Meds ordered this encounter  Medications  . ALPRAZolam (XANAX) 0.5 MG tablet    Sig: TAKE 1 TABLET EVERY 8 HOURS AS NEEDED FOR ANXIETY.    Dispense:  30 tablet    Refill:  0  . HYDROcodone-acetaminophen (NORCO) 10-325 MG tablet    Sig: TAKE ONE TABLET EVERY 6 HOURS AS NEEDED.    Dispense:  36 tablet    Refill:  0   Return precautions advised.  Garret Reddish, MD

## 2018-12-21 ENCOUNTER — Other Ambulatory Visit: Payer: Self-pay

## 2018-12-21 ENCOUNTER — Ambulatory Visit (INDEPENDENT_AMBULATORY_CARE_PROVIDER_SITE_OTHER): Payer: Medicare Other

## 2018-12-21 ENCOUNTER — Encounter: Payer: Self-pay | Admitting: Family Medicine

## 2018-12-21 DIAGNOSIS — Z23 Encounter for immunization: Secondary | ICD-10-CM | POA: Diagnosis not present

## 2019-03-03 ENCOUNTER — Telehealth: Payer: Self-pay | Admitting: Family Medicine

## 2019-03-03 NOTE — Telephone Encounter (Signed)
I called the patient to schedule AWV with Loma Sousa, but there was no answer and no option to leave a message. If patient calls back, please schedule Medicare Wellness Visit at next available opening.  VDM (Dee-Dee)

## 2019-03-04 ENCOUNTER — Telehealth: Payer: Self-pay | Admitting: Family Medicine

## 2019-03-04 NOTE — Telephone Encounter (Signed)
Patient stated [place] are not mandatory by insurance companies and she will not be sch an appt at this time . Please make note.

## 2019-03-14 ENCOUNTER — Other Ambulatory Visit: Payer: Self-pay

## 2019-03-15 ENCOUNTER — Ambulatory Visit (INDEPENDENT_AMBULATORY_CARE_PROVIDER_SITE_OTHER): Payer: Medicare Other | Admitting: Family Medicine

## 2019-03-15 ENCOUNTER — Encounter: Payer: Self-pay | Admitting: Family Medicine

## 2019-03-15 VITALS — BP 110/62 | HR 68 | Temp 97.8°F | Ht 61.0 in | Wt 141.8 lb

## 2019-03-15 DIAGNOSIS — R7989 Other specified abnormal findings of blood chemistry: Secondary | ICD-10-CM

## 2019-03-15 DIAGNOSIS — M5441 Lumbago with sciatica, right side: Secondary | ICD-10-CM | POA: Diagnosis not present

## 2019-03-15 DIAGNOSIS — I10 Essential (primary) hypertension: Secondary | ICD-10-CM

## 2019-03-15 DIAGNOSIS — E785 Hyperlipidemia, unspecified: Secondary | ICD-10-CM | POA: Diagnosis not present

## 2019-03-15 DIAGNOSIS — I999 Unspecified disorder of circulatory system: Secondary | ICD-10-CM | POA: Diagnosis not present

## 2019-03-15 DIAGNOSIS — D6859 Other primary thrombophilia: Secondary | ICD-10-CM | POA: Diagnosis not present

## 2019-03-15 DIAGNOSIS — G8929 Other chronic pain: Secondary | ICD-10-CM

## 2019-03-15 DIAGNOSIS — I739 Peripheral vascular disease, unspecified: Secondary | ICD-10-CM

## 2019-03-15 DIAGNOSIS — N183 Chronic kidney disease, stage 3 unspecified: Secondary | ICD-10-CM

## 2019-03-15 DIAGNOSIS — Z0000 Encounter for general adult medical examination without abnormal findings: Secondary | ICD-10-CM

## 2019-03-15 DIAGNOSIS — F4322 Adjustment disorder with anxiety: Secondary | ICD-10-CM

## 2019-03-15 DIAGNOSIS — R7401 Elevation of levels of liver transaminase levels: Secondary | ICD-10-CM

## 2019-03-15 DIAGNOSIS — Z79899 Other long term (current) drug therapy: Secondary | ICD-10-CM

## 2019-03-15 LAB — TSH: TSH: 4.59 u[IU]/mL — ABNORMAL HIGH (ref 0.35–4.50)

## 2019-03-15 LAB — COMPREHENSIVE METABOLIC PANEL
ALT: 19 U/L (ref 0–35)
AST: 20 U/L (ref 0–37)
Albumin: 4 g/dL (ref 3.5–5.2)
Alkaline Phosphatase: 42 U/L (ref 39–117)
BUN: 19 mg/dL (ref 6–23)
CO2: 28 mEq/L (ref 19–32)
Calcium: 9.4 mg/dL (ref 8.4–10.5)
Chloride: 101 mEq/L (ref 96–112)
Creatinine, Ser: 0.98 mg/dL (ref 0.40–1.20)
GFR: 55.92 mL/min — ABNORMAL LOW (ref >60.00)
Glucose, Bld: 83 mg/dL (ref 70–99)
Potassium: 3.6 mEq/L (ref 3.5–5.1)
Sodium: 139 mEq/L (ref 135–145)
Total Bilirubin: 0.3 mg/dL (ref 0.2–1.2)
Total Protein: 6.9 g/dL (ref 6.0–8.3)

## 2019-03-15 LAB — POC URINALSYSI DIPSTICK (AUTOMATED)
Bilirubin, UA: NEGATIVE
Blood, UA: NEGATIVE
Glucose, UA: NEGATIVE
Ketones, UA: NEGATIVE
Leukocytes, UA: NEGATIVE
Nitrite, UA: NEGATIVE
Protein, UA: NEGATIVE
Spec Grav, UA: 1.02 (ref 1.010–1.025)
Urobilinogen, UA: 0.2 E.U./dL
pH, UA: 6 (ref 5.0–8.0)

## 2019-03-15 LAB — CBC WITH DIFFERENTIAL/PLATELET
Basophils Absolute: 0.1 10*3/uL (ref 0.0–0.1)
Basophils Relative: 1.2 % (ref 0.0–3.0)
Eosinophils Absolute: 0.2 10*3/uL (ref 0.0–0.7)
Eosinophils Relative: 2.6 % (ref 0.0–5.0)
HCT: 43 % (ref 36.0–46.0)
Hemoglobin: 14.3 g/dL (ref 12.0–15.0)
Lymphocytes Relative: 37.6 % (ref 12.0–46.0)
Lymphs Abs: 2.2 10*3/uL (ref 0.7–4.0)
MCHC: 33.2 g/dL (ref 30.0–36.0)
MCV: 106.3 fl — ABNORMAL HIGH (ref 78.0–100.0)
Monocytes Absolute: 0.6 10*3/uL (ref 0.1–1.0)
Monocytes Relative: 10.1 % (ref 3.0–12.0)
Neutro Abs: 2.8 10*3/uL (ref 1.4–7.7)
Neutrophils Relative %: 48.5 % (ref 43.0–77.0)
Platelets: 331 10*3/uL (ref 150.0–400.0)
RBC: 4.04 Mil/uL (ref 3.87–5.11)
RDW: 13.4 % (ref 11.5–15.5)
WBC: 5.9 10*3/uL (ref 4.0–10.5)

## 2019-03-15 LAB — LIPID PANEL
Cholesterol: 170 mg/dL (ref 0–200)
HDL: 80.7 mg/dL (ref >39.00)
LDL Cholesterol: 79 mg/dL (ref 0–99)
NonHDL: 88.92
Total CHOL/HDL Ratio: 2
Triglycerides: 49 mg/dL (ref 0.0–149.0)
VLDL: 9.8 mg/dL (ref 0.0–40.0)

## 2019-03-15 MED ORDER — ALBUTEROL SULFATE HFA 108 (90 BASE) MCG/ACT IN AERS: 1.0000 | INHALATION_SPRAY | Freq: Four times a day (QID) | RESPIRATORY_TRACT | 0 refills | Status: DC | PRN

## 2019-03-15 MED ORDER — HYDROCODONE-ACETAMINOPHEN 10-325 MG PO TABS: ORAL_TABLET | ORAL | 0 refills | Status: DC

## 2019-03-15 MED ORDER — ALPRAZOLAM 0.5 MG PO TABS: ORAL_TABLET | ORAL | 0 refills | Status: DC

## 2019-03-15 NOTE — Progress Notes (Signed)
Phone: [phone]   Subjective:  Patient presents today for their annual physical. Chief complaint-noted.   See problem oriented charting- Review of Systems  Constitutional: Negative.   HENT: Negative.   Eyes: Negative.   Respiratory: Negative.   Cardiovascular: Negative.   Gastrointestinal: Positive for constipation.       Takes over the counter medications that help. Only has to treat 3-4 times a month.   Genitourinary: Negative.   Musculoskeletal: Positive for back pain.       Seen by ortho in the past for back pain  Some hand pain otc meds help   Skin: Negative.   Neurological: Positive for headaches.       On treatment   Endo/Heme/Allergies: Negative.   Psychiatric/Behavioral: Negative.     The following were reviewed and entered/updated in epic: Past Medical History:  Diagnosis Date  . Abnormal Pap smear    h/o  . Allergy    seasonal  . Arthritis   . Collagen vascular disease ([place])   . Elevated LFTs    Work up negative - Dr. Olevia Perches  . Hyperlipidemia   . Hyperplastic colon polyp   . Hypertension   . Internal hemorrhoids   . INTERNAL HEMORRHOIDS 01/24/2009   Qualifier: Diagnosis of  By: Nelson-Smith CMA (AAMA), Dottie    . Migraine   . Prothrombin gene mutation ([place])    heterozygous  . PVD (peripheral vascular disease) ([place])    takes Pletal to open blood flow of blood to fingers, toes, and lower extremities  . Raynaud's disease   . Vasculitis ([place])   . Venous insufficiency    superficial left leg- seeing a vein specialist in August   Patient Active Problem List   Diagnosis Date Noted  . Chronic back pain 11/29/2013    Priority: High  . Hypercoagulable state ([place]) 05/26/2012    Priority: High  . Paroxysmal digital cyanosis 05/26/2012    Priority: High  . Vasculopathy 11/29/2007    Priority: High  . CKD (chronic kidney disease), stage III 03/04/2017    Priority: Medium  . Transaminitis 05/02/2015    Priority: Medium  . Adjustment disorder with  anxiety 11/29/2013    Priority: Medium  . Intermittent claudication ([place]) 05/26/2012    Priority: Medium  . Hyperlipidemia 02/04/2010    Priority: Medium  . Essential hypertension 11/19/2006    Priority: Medium  . Cerumen impaction 03/04/2017    Priority: Low  . Varicose veins of right lower extremity with pain     Priority: Low  . Macrocytosis without anemia 11/29/2013    Priority: Low  . Hypothyroidism 03/10/2018  . Arthritis 11/15/2015   Past Surgical History:  Procedure Laterality Date  . ABDOMINAL HYSTERECTOMY  1979   TAH/LSO  . ABLATION SAPHENOUS VEIN W/ RFA    . BUNIONECTOMY WITH HAMMERTOE RECONSTRUCTION Left 10/13/2013   Procedure: LEFT FIRST METATARSAL SCARF OSTEOTOMY;  LEFT MODIFIED MCBRIDE BUNIONECTOMY AND SECOND HAMMERTOE CORRECTION;  Surgeon: Wylene Simmer, MD;  Location: [facility];  Service: Orthopedics;  Laterality: Left;  . CATARACT EXTRACTION Bilateral 09/2014   Dr. Talbert Forest  . CERVIX LESION DESTRUCTION  1970  . COLONOSCOPY    . COLONOSCOPY    . DILATION AND CURETTAGE OF UTERUS    . LASER ABLATION Bilateral    leg-left 8/14, right 12/14  . MASS EXCISION Left    hand  . osteopenia of hip  2017   T score -1.1  . PELVIC LAPAROSCOPY     d/t infertility  .  right bartholin's gland cyst excision Right    1990s  . sympthectomy Left 2008   hand ([facility])    Family History  Problem Relation Age of Onset  . Stroke Mother   . Colon cancer Mother 34       dec with colon ca  . Heart attack Mother   . Migraines Mother   . Stroke Father   . Heart attack Father   . Gallbladder disease Father 10       septic gallbladder  . Rheum arthritis Brother        dec age 56 ?Rheumatoid arthritis  . Diabetes Maternal Grandmother   . Esophageal cancer Neg Hx   . Stomach cancer Neg Hx   . Rectal cancer Neg Hx     Medications- reviewed and updated Current Outpatient Medications  Medication Sig Dispense Refill  . albuterol (PROAIR HFA) 108 (90 Base) MCG/ACT  inhaler Inhale 1-2 puffs into the lungs every 6 (six) hours as needed for wheezing or shortness of breath. 18 g 0  . ALPRAZolam (XANAX) 0.5 MG tablet TAKE 1 TABLET EVERY 8 HOURS AS NEEDED FOR ANXIETY. 30 tablet 0  . amLODipine (NORVASC) 5 MG tablet Take 1 tablet by mouth 2 (two) times daily.     Marland Kitchen aspirin (BABY ASPIRIN) 81 MG chewable tablet Chew 81 mg by mouth daily.      . cilostazol (PLETAL) 100 MG tablet Take 100 mg by mouth 2 (two) times daily.      . cyanocobalamin 1000 MCG tablet Take 1,000 mcg by mouth daily.    Marland Kitchen estradiol (CLIMARA - DOSED IN MG/24 HR) 0.0375 mg/24hr patch Place onto the skin.    Marland Kitchen FLUoxetine (PROZAC) 40 MG capsule TAKE (1) CAPSULE DAILY. 90 capsule 0  . HYDROcodone-acetaminophen (NORCO) 10-325 MG tablet TAKE ONE TABLET EVERY 4-6 HOURS AS NEEDED. 30 tablet 0  . isosorbide mononitrate (IMDUR) 60 MG 24 hr tablet Take 60 mg by mouth daily.      . potassium chloride SA (K-DUR,KLOR-CON) 20 MEQ tablet TAKE 1 TABLET DAILY. 30 tablet 0  . rosuvastatin (CRESTOR) 10 MG tablet Take 1 tablet by mouth daily.    Marland Kitchen triamterene-hydrochlorothiazide (MAXZIDE-25) 37.5-25 MG per tablet Take 1 tablet by mouth daily.     No current facility-administered medications for this visit.    Allergies-reviewed and updated Allergies  Allergen Reactions  . Prednisone Swelling    short course=swelling    Social History   Social History Narrative   Married 28 years   3 adopted children   Objective  Objective:  BP 110/62   Pulse 68   Temp 97.8 F (36.6 C) (Temporal)   Ht 5\' 1"  (1.549 m)   Wt 141 lb 12.8 oz (64.3 kg)   LMP 03/31/1977   BMI 26.79 kg/m  Gen: NAD, resting comfortably HEENT: Mask not removed due to covid 19. TM normal. Bridge of nose normal. Eyelids normal.  Neck: no thyromegaly or cervical lymphadenopathy  CV: RRR no murmurs rubs or gallops Lungs: CTAB no crackles, wheeze, rhonchi Abdomen: soft/nontender/nondistended/normal bowel sounds. No rebound or guarding.    Ext: no edema Skin: warm, dry Neuro: grossly normal, moves all extremities, PERRLA MSK: Small ganglion cyst on patient's hand-discussed possible trial of Voltaren but to be cautious given vascular issues   Assessment and Plan   73 y.o. female presenting for annual physical.  Health Maintenance counseling: 1. Anticipatory guidance: Patient counseled regarding regular dental exams q6 months, eye exams every few years last one  4 yrs ago- no issues. Perhaps update next year.Marland Kitchen Avoiding smoking and second hand smoke , limiting alcohol to 1 beverage per day. Only drinks occasionally. On average drinks 8 glasses of wine a month.     2. Risk factor reduction:  Advised patient of need for regular exercise and diet rich and fruits and vegetables to reduce risk of heart attack and stroke. Exercise-on average does not exercise but dies try to have active lifestyle like yardwork. Misses going to the gym. Considering treadmill but may wait for gym to open back up/be safer.  Encouraged exercising. Diet-working on healthy eating and increasing vegetables  . Reasonably healthy Wt Readings from Last 3 Encounters:  03/15/19 141 lb 12.8 oz (64.3 kg)  11/03/18 137 lb (62.1 kg)  03/10/18 141 lb 6.4 oz (64.1 kg)  3. Immunizations/screenings/ancillary studies- interested in vaccine for covid. Defer shingrix and tdap for now.  Immunization History  Administered Date(s) Administered  . DTaP 12/25/2009  . Fluad Quad(high Dose 65+) 12/21/2018  . Influenza Split 01/17/2011  . Influenza, High Dose Seasonal PF 02/26/2015, 04/24/2016, 01/20/2017, 03/10/2018  . Influenza,inj,Quad PF,6+ Mos 01/07/2013, 01/13/2014  . Pneumococcal Conjugate-13 04/19/2013  . Pneumococcal Polysaccharide-23 05/02/2015  . Tdap 03/31/2008  4. Cervical cancer screening- not seen by GYN history of hysterectomy . No history of recent abnormal pap and passed age required screening.  5. Breast cancer screening-  breast exam monthly  and mammogram  yearly up to date 08/20/2018 6. Colon cancer screening -due 10/2019. Had been with Dr. Olevia Perches- was told after Dr. Olevia Perches left may have to have sit down consult due to other medical issues. Was told 7 year follow up due to family history.  7. Skin cancer screening- no dermatologist. advised regular sunscreen use. Denies worrisome, changing, or new skin lesions.  8. Birth control/STD check- not needed per patient  9. Osteoporosis screening at 23- last done 2017. Wants to defer repeat to next year.  -former smoker under a pack a day for 1yrs had not smoked in 30 yrs . Will get ua  Status of chronic or acute concerns    #Vasculopathy/Hypercoagulable state (HCC)/ claudication -Continues follow-up with [facility] cardiology -On cilostazol milligrams twice a day to help with claudication.  I am only able to feel a pulse right posterior tibialis well-faint and left leg. -Known hypercoagulable state as she is heterozygous for prothrombin mutation but no history of arterial or venous thrombosis.  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  She is on vasodilators amlodipine and Imdur to try to help  #Chronic bilateral low back pain with right-sided sciatica- on and off issues/Migraines -Patient uses very sparing hydrocodone for migraines her flareups of back pain.  She has 7 pills left from #36 filled in August.  She has to avoid NSAIDs and triptans due to vascular concerns. -Refill of hydrocodone today #30 with hopes this last at least 4 months.  Ideally 6 months at which time we advise follow-up -Update UDS today -PDMP reviewed and only refill through me with hydrocodone and alprazolam-she knows to space these by at least 8 hours -Controlled substance contract was signed 2018  Transaminitis- continue to monitor- normal last year.  Thought potentially related to statin therapy in the past Lab Results  Component Value Date   ALT 18 03/10/2018    AST 22 03/10/2018   ALKPHOS 49 03/10/2018   BILITOT 0.4 03/10/2018    Hyperlipidemia, unspecified hyperlipidemia type - Plan: CBC with Differential/Platelet,  Comprehensive metabolic panel, Lipid panel, POCT Urinalysis Dipstick (Automated) - rosuvastaitn 10 mg every other day.  Target LDL at least under 100  Essential hypertension - Plan: POCT Urinalysis Dipstick (Automated) -Blood pressure is very well controlled today.  Continue medications as above including amlodipine 5 mg twice a day, Imdur 60 mg daily, triamterene hydrochlorothiazide 37.5-25 mg.  Also has to take potassium.  Stage 3 chronic kidney disease, unspecified whether stage 3a or 3b CKD - slight dip last year-b ut improved with hydration with GFR back into the high 50s  Adjustment disorder with anxiety -Patient is compliant with Prozac 40 mg daily.  For the most part this controls her symptoms but she also uses sparing alprazolam a few times a month.  Once again she knows to space this up at least 8 hours from her hydrocodone.  Elevated TSH - Plan: TSH.  Last TSH was normal but has had some mild elevations in the past-do not plan to treat unless TSH gets above 10 persistently  Patient with reactive type airways gets exposed to dust/pollen at times-will refill albuterol for her to have on hand  Recommended follow up: Return in about 6 months (around 09/13/2019) for follow up- or sooner if needed.   Lab/Order associations: fasting   ICD-10-CM   1. Preventative health care  Z00.00 CBC with Differential/Platelet    Comprehensive metabolic panel    Lipid panel    UDS urine drug screen    TSH  2. Vasculopathy  I99.9   3. Hypercoagulable state ([place])  D68.59   4. Chronic bilateral low back pain with right-sided sciatica  M54.41    G89.29   5. Transaminitis  R74.01   6. Intermittent claudication (HCC)  I73.9   7. Hyperlipidemia, unspecified hyperlipidemia type  E78.5 CBC with Differential/Platelet    Comprehensive metabolic  panel    Lipid panel    POCT Urinalysis Dipstick (Automated)    POCT Urinalysis Dipstick (Automated)  8. Essential hypertension  I10 POCT Urinalysis Dipstick (Automated)    POCT Urinalysis Dipstick (Automated)  9. Stage 3 chronic kidney disease, unspecified whether stage 3a or 3b CKD  N18.30   10. Adjustment disorder with anxiety  F43.22   11. High risk medication use  Z79.899 UDS urine drug screen  12. Elevated TSH  R79.89 TSH    Meds ordered this encounter  Medications  . ALPRAZolam (XANAX) 0.5 MG tablet    Sig: TAKE 1 TABLET EVERY 8 HOURS AS NEEDED FOR ANXIETY.    Dispense:  30 tablet    Refill:  0  . albuterol (PROAIR HFA) 108 (90 Base) MCG/ACT inhaler    Sig: Inhale 1-2 puffs into the lungs every 6 (six) hours as needed for wheezing or shortness of breath.    Dispense:  18 g    Refill:  0  . HYDROcodone-acetaminophen (NORCO) 10-325 MG tablet    Sig: TAKE ONE TABLET EVERY 4-6 HOURS AS NEEDED.    Dispense:  30 tablet    Refill:  0    Return precautions advised.  Garret Reddish, MD

## 2019-03-15 NOTE — Patient Instructions (Addendum)
Health Maintenance Due  Topic Date Due  . TETANUS/TDAP Patient declines  03/31/2018   Please check with your pharmacy to see if they have the shingrix vaccine. If they do- please get this immunization and update [place] by phone call or mychart with dates you receive the vaccine  Glad you are doing well!   Recommended follow up: Return in about 6 months (around 09/13/2019) for follow up- or sooner if needed. med refills and can recheck kidneys and BP

## 2019-03-17 LAB — PAIN MGMT, PROFILE 8 W/CONF, U
6 Acetylmorphine: NEGATIVE ng/mL
Alcohol Metabolites: NEGATIVE ng/mL (ref <500)
Amphetamines: NEGATIVE ng/mL
Benzodiazepines: NEGATIVE ng/mL
Buprenorphine, Urine: NEGATIVE ng/mL
Cocaine Metabolite: NEGATIVE ng/mL
Creatinine: 127.2 mg/dL
MDMA: NEGATIVE ng/mL
Marijuana Metabolite: NEGATIVE ng/mL
Opiates: NEGATIVE ng/mL
Oxidant: NEGATIVE ug/mL
Oxycodone: NEGATIVE ng/mL
pH: 6.7 (ref 4.5–9.0)

## 2019-05-23 DIAGNOSIS — I7301 Raynaud's syndrome with gangrene: Secondary | ICD-10-CM | POA: Diagnosis not present

## 2019-05-23 DIAGNOSIS — I739 Peripheral vascular disease, unspecified: Secondary | ICD-10-CM | POA: Diagnosis not present

## 2019-05-23 DIAGNOSIS — E782 Mixed hyperlipidemia: Secondary | ICD-10-CM | POA: Diagnosis not present

## 2019-05-23 DIAGNOSIS — I8393 Asymptomatic varicose veins of bilateral lower extremities: Secondary | ICD-10-CM | POA: Diagnosis not present

## 2019-05-26 DIAGNOSIS — Z0120 Encounter for dental examination and cleaning without abnormal findings: Secondary | ICD-10-CM | POA: Diagnosis not present

## 2019-07-26 ENCOUNTER — Telehealth: Payer: Self-pay | Admitting: Family Medicine

## 2019-07-26 NOTE — Telephone Encounter (Signed)
Left message for patient to call back and schedule Medicare Annual Wellness Visit (AWV) either virtually/audio only OR in office. Whatever the patients preference is.  No hx; please schedule at anytime with [facility] at [facility].

## 2019-07-28 ENCOUNTER — Encounter: Payer: Self-pay | Admitting: Family Medicine

## 2019-08-16 ENCOUNTER — Other Ambulatory Visit: Payer: Self-pay | Admitting: Family Medicine

## 2019-08-17 NOTE — Telephone Encounter (Signed)
LVM for patient to call back and schedule a 6 month f/u

## 2019-08-17 NOTE — Telephone Encounter (Signed)
Controlled substances-needs follow-up visit-was recommended for 51-month follow-up at last visit

## 2019-08-17 NOTE — Telephone Encounter (Signed)
Patient is scheduled for 09/08/19 virtually at 4:20 pm.

## 2019-08-19 ENCOUNTER — Other Ambulatory Visit: Payer: Self-pay | Admitting: Family Medicine

## 2019-08-25 ENCOUNTER — Other Ambulatory Visit: Payer: Self-pay | Admitting: Family Medicine

## 2019-08-25 DIAGNOSIS — Z1231 Encounter for screening mammogram for malignant neoplasm of breast: Secondary | ICD-10-CM

## 2019-08-30 DIAGNOSIS — Z0120 Encounter for dental examination and cleaning without abnormal findings: Secondary | ICD-10-CM | POA: Diagnosis not present

## 2019-09-01 ENCOUNTER — Ambulatory Visit
Admission: RE | Admit: 2019-09-01 | Discharge: 2019-09-01 | Disposition: A | Discharge status: Home / Self Care | Payer: Medicare Other | Source: Ambulatory Visit | Attending: Family Medicine | Admitting: Family Medicine

## 2019-09-01 ENCOUNTER — Other Ambulatory Visit: Payer: Self-pay

## 2019-09-01 DIAGNOSIS — Z1231 Encounter for screening mammogram for malignant neoplasm of breast: Secondary | ICD-10-CM

## 2019-09-02 ENCOUNTER — Encounter: Payer: Self-pay | Admitting: Family Medicine

## 2019-09-08 ENCOUNTER — Encounter: Payer: Self-pay | Admitting: Family Medicine

## 2019-09-08 ENCOUNTER — Telehealth (INDEPENDENT_AMBULATORY_CARE_PROVIDER_SITE_OTHER): Payer: Medicare Other | Admitting: Family Medicine

## 2019-09-08 VITALS — Ht 61.0 in | Wt 135.0 lb

## 2019-09-08 DIAGNOSIS — E785 Hyperlipidemia, unspecified: Secondary | ICD-10-CM

## 2019-09-08 DIAGNOSIS — M5441 Lumbago with sciatica, right side: Secondary | ICD-10-CM

## 2019-09-08 DIAGNOSIS — D6859 Other primary thrombophilia: Secondary | ICD-10-CM

## 2019-09-08 DIAGNOSIS — N183 Chronic kidney disease, stage 3 unspecified: Secondary | ICD-10-CM | POA: Diagnosis not present

## 2019-09-08 DIAGNOSIS — G8929 Other chronic pain: Secondary | ICD-10-CM

## 2019-09-08 DIAGNOSIS — R7989 Other specified abnormal findings of blood chemistry: Secondary | ICD-10-CM | POA: Diagnosis not present

## 2019-09-08 DIAGNOSIS — I10 Essential (primary) hypertension: Secondary | ICD-10-CM

## 2019-09-08 DIAGNOSIS — F4322 Adjustment disorder with anxiety: Secondary | ICD-10-CM

## 2019-09-08 DIAGNOSIS — E039 Hypothyroidism, unspecified: Secondary | ICD-10-CM

## 2019-09-08 DIAGNOSIS — I999 Unspecified disorder of circulatory system: Secondary | ICD-10-CM

## 2019-09-08 DIAGNOSIS — I739 Peripheral vascular disease, unspecified: Secondary | ICD-10-CM

## 2019-09-08 DIAGNOSIS — D7589 Other specified diseases of blood and blood-forming organs: Secondary | ICD-10-CM

## 2019-09-08 NOTE — Progress Notes (Signed)
Phone [phone] Virtual visit via Video note   Subjective:  Chief complaint: Chief Complaint  Patient presents with  . virtual  . Follow-up    This visit type was conducted due to national recommendations for restrictions regarding the COVID-19 Pandemic (e.g. social distancing).  This format is felt to be most appropriate for this patient at this time balancing risks to patient and risks to population by having him in for in person visit.  No physical exam was performed (except for noted visual exam or audio findings with Telehealth visits).    Our team/I connected with Kari Welch at  4:20 PM EDT by a video enabled telemedicine application (doxy.me or caregility through epic) and verified that I am speaking with the correct person using two identifiers.  Location patient: [place] Location provider: [facility], office Persons participating in the virtual visit:  patient  Our team/I discussed the limitations of evaluation and management by telemedicine and the availability of in person appointments. In light of current covid-19 pandemic, patient also understands that we are trying to protect them by minimizing in office contact if at all possible.  The patient expressed consent for telemedicine visit and agreed to proceed. Patient understands insurance will be billed.   Past Medical History-  Patient Active Problem List   Diagnosis Date Noted  . Chronic back pain 11/29/2013    Priority: High  . Hypercoagulable state ([place]) 05/26/2012    Priority: High  . Paroxysmal digital cyanosis 05/26/2012    Priority: High  . Vasculopathy 11/29/2007    Priority: High  . CKD (chronic kidney disease), stage III 03/04/2017    Priority: Medium  . Transaminitis 05/02/2015    Priority: Medium  . Adjustment disorder with anxiety 11/29/2013    Priority: Medium  . Intermittent claudication ([place]) 05/26/2012    Priority: Medium  . Hyperlipidemia 02/04/2010    Priority: Medium  . Essential  hypertension 11/19/2006    Priority: Medium  . Cerumen impaction 03/04/2017    Priority: Low  . Varicose veins of right lower extremity with pain     Priority: Low  . Macrocytosis without anemia 11/29/2013    Priority: Low  . Hypothyroidism 03/10/2018  . Arthritis 11/15/2015    Medications- reviewed and updated Current Outpatient Medications  Medication Sig Dispense Refill  . albuterol (PROAIR HFA) 108 (90 Base) MCG/ACT inhaler Inhale 1-2 puffs into the lungs every 6 (six) hours as needed for wheezing or shortness of breath. 18 g 0  . ALPRAZolam (XANAX) 0.5 MG tablet TAKE 1 TABLET EVERY 8 HOURS AS NEEDED FOR ANXIETY. 30 tablet 0  . amLODipine (NORVASC) 5 MG tablet Take 1 tablet by mouth 2 (two) times daily.     Marland Kitchen aspirin (BABY ASPIRIN) 81 MG chewable tablet Chew 81 mg by mouth daily.      . cilostazol (PLETAL) 100 MG tablet Take 100 mg by mouth 2 (two) times daily.      . cyanocobalamin 1000 MCG tablet Take 1,000 mcg by mouth daily.    Marland Kitchen FLUoxetine (PROZAC) 40 MG capsule TAKE (1) CAPSULE DAILY. 90 capsule 0  . HYDROcodone-acetaminophen (NORCO) 10-325 MG tablet TAKE (1) TABLET EVERY FOUR TO SIX HOURS AS NEEDED. 30 tablet 0  . isosorbide mononitrate (IMDUR) 60 MG 24 hr tablet Take 60 mg by mouth daily.      . potassium chloride SA (K-DUR,KLOR-CON) 20 MEQ tablet TAKE 1 TABLET DAILY. 30 tablet 0  . rosuvastatin (CRESTOR) 10 MG tablet Take 1 tablet by mouth  daily.    . triamterene-hydrochlorothiazide (MAXZIDE-25) 37.5-25 MG per tablet Take 1 tablet by mouth daily.     No current facility-administered medications for this visit.     Objective:  Ht 5\' 1"  (1.549 m)   Wt 135 lb (61.2 kg)   LMP 03/31/1977   BMI 25.51 kg/m  self reported vitals Gen: NAD, resting comfortably Lungs: nonlabored, normal respiratory rate  Skin: appears dry, no obvious rash     Assessment and Plan    #Vasculopathy #Hypercoagulable state [facility]) #Claudication- [facility] cardiology S: No recent issues  reported. -On pletal/cilostazol 100mg  twice a day to help with claudication. No recent issues reported -Known hypercoagulable state as she is heterozygous for prothrombin mutation but no history of arterial or venous thrombosis.  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   -She is on vasodilators amlodipine  5 mg BID and Imdur 60mg  to try to help  A/P: Vasculopathy and hypercoagulable state appears stable.  Claudication likely caused by small vessel disease-continue amlodipine and Imdur and [facility] follow-up.   #Migraines- due to vascular concerns avoids nsaids and triptans #Chronic bilateral low back pain with right-sided sciatica- on and off issues S: just coming off a migraine from yesterday- had to use a hydrocodone and still feels slightly off from that. Still slightly foggy today.   Back issues come and go - feels like she was able to work through it most recent flare without medication.    Has only used 3 hydrocodone since may 21st.   -Patient uses very sparing hydrocodone for migraines her flareups of back pain. -Typically #30 hydrocodone last at least 4 months. A/P: Migraines with reasonable control with sparing hydrocodone 10 mg-refilled medication last month and she has some available-hoping that #30 last at least 4 months again.  Has a visit scheduled in December.  Thankfully back has not been bothering her as much lately -She reports trying to get away with Aleve for migraines but with microvascular disease encouraged her to try to avoid this -03/15/2019 UDS - PDMP reviewed and low risk utilization trend -also on on alprazolam but knows to space by at least 8 hours from hydrocodone controlled substance contract on file from 2018   #hyperlipidemia/eleated LFTs in the past S: Medication:Rosuvastatin 10 mg every other day.  LDL goal at least under 100 but likely would prefer under 70 with microvascular disease   Transaminitis-thought potentially related to statin use in the past.  Normal 2020  A/P: We reviewed last LDL being slightly above goal of 70 or less-we can update at December visit and consider making adjustments.  She denies significant myalgia issues   #hypertension S: medication: Amlodipine 5 mg twice daily, Imdur 60 mg daily, transferring hydrochlorothiazide 37.5-25 mg.  Also takes potassium A/P: Well-controlled at last check-did not have updated readings today-we will trend that December visit   #Chronic kidney disease stage III S: GFR is typically in the 50s range-has dipped into the 40s with poor hydration in the past -Patient knows to avoid NSAIDs after discussion today-has been using some sparing Aleve A/P: Hopefully stable-patient agrees to come back for BMP  # Adjustment disorder with anxiety S: Medication: Prozac 40 mg.  Sparing alprazolam a few times a month for anxiety element A/P: Reasonable control reported-continue current medications and continue to space alprazolam by at least 8 hours from other medication  #Elevated TSH-slightly elevated TSH in the past.  Trend at least every 6 to 12 months-we will  update when she comes back for labs Lab Results Component Value Date  TSH 4.59 (H) 03/15/2019  #Macrocytosis-patient reports many years of issues.  B12 and folate have been normal in the past but high methylmalonic acid so she is taking B12    Recommended follow up: We will keep December physical Future Appointments  Date Time Provider [place]  03/15/2020 11:20 AM Marin Olp, MD [facility]   Lab/Order associations:   ICD-10-CM   1. Essential hypertension  I10   2. Hyperlipidemia, unspecified hyperlipidemia type  E78.5   3. Stage 3 chronic kidney disease, unspecified whether stage 3a or 3b CKD  W23.76 Basic metabolic panel  4. Elevated TSH  R79.89 TSH  5. Hypercoagulable state ([place])  D68.59   6. Intermittent claudication (HCC)  I73.9   7.  Adjustment disorder with anxiety  F43.22   8. Macrocytosis without anemia  D75.89   9. Chronic bilateral low back pain with right-sided sciatica  M54.41    G89.29   10. Vasculopathy  I99.9   11. Hypothyroidism, unspecified type  E03.9      Return precautions advised.  Garret Reddish, MD

## 2019-10-12 ENCOUNTER — Other Ambulatory Visit: Payer: Self-pay

## 2019-10-12 ENCOUNTER — Other Ambulatory Visit (INDEPENDENT_AMBULATORY_CARE_PROVIDER_SITE_OTHER): Payer: Medicare Other

## 2019-10-12 DIAGNOSIS — N183 Chronic kidney disease, stage 3 unspecified: Secondary | ICD-10-CM

## 2019-10-12 DIAGNOSIS — R7989 Other specified abnormal findings of blood chemistry: Secondary | ICD-10-CM | POA: Diagnosis not present

## 2019-10-12 LAB — BASIC METABOLIC PANEL
BUN: 16 mg/dL (ref 6–23)
CO2: 32 mEq/L (ref 19–32)
Calcium: 9.6 mg/dL (ref 8.4–10.5)
Chloride: 99 mEq/L (ref 96–112)
Creatinine, Ser: 1.06 mg/dL (ref 0.40–1.20)
GFR: 51 mL/min — ABNORMAL LOW (ref >60.00)
Glucose, Bld: 95 mg/dL (ref 70–99)
Potassium: 3.7 mEq/L (ref 3.5–5.1)
Sodium: 138 mEq/L (ref 135–145)

## 2019-10-12 LAB — TSH: TSH: 6.24 u[IU]/mL — ABNORMAL HIGH (ref 0.35–4.50)

## 2020-02-01 ENCOUNTER — Other Ambulatory Visit: Payer: Self-pay | Admitting: Family Medicine

## 2020-02-14 ENCOUNTER — Ambulatory Visit (INDEPENDENT_AMBULATORY_CARE_PROVIDER_SITE_OTHER): Payer: Medicare Other

## 2020-02-14 ENCOUNTER — Encounter: Payer: Self-pay | Admitting: Family Medicine

## 2020-02-14 ENCOUNTER — Other Ambulatory Visit: Payer: Self-pay

## 2020-02-14 DIAGNOSIS — Z23 Encounter for immunization: Secondary | ICD-10-CM | POA: Diagnosis not present

## 2020-03-14 NOTE — Progress Notes (Signed)
Phone [phone]   Subjective:  Patient presents today for their annual physical. Chief complaint-noted.   See problem oriented charting- ROS- full  review of systems was completed and negative except for: light sensitivity unchanged, occasional constipation, joint pain and back pain about hte same, headaches  The following were reviewed and entered/updated in epic: Past Medical History:  Diagnosis Date  . Abnormal Pap smear    h/o  . Allergy    seasonal  . Arthritis   . Collagen vascular disease ([place])   . Elevated LFTs    Work up negative - Dr. Olevia Perches  . Hyperlipidemia   . Hyperplastic colon polyp   . Hypertension   . Internal hemorrhoids   . INTERNAL HEMORRHOIDS 01/24/2009   Qualifier: Diagnosis of  By: Nelson-Smith CMA (AAMA), Dottie    . Migraine   . Prothrombin gene mutation ([place])    heterozygous  . PVD (peripheral vascular disease) ([place])    takes Pletal to open blood flow of blood to fingers, toes, and lower extremities  . Raynaud's disease   . Vasculitis ([place])   . Venous insufficiency    superficial left leg- seeing a vein specialist in August   Patient Active Problem List   Diagnosis Date Noted  . Urticaria 03/15/2020    Priority: High  . Chronic back pain 11/29/2013    Priority: High  . Hypercoagulable state ([place]) 05/26/2012    Priority: High  . Paroxysmal digital cyanosis 05/26/2012    Priority: High  . Vasculopathy 11/29/2007    Priority: High  . Elevated TSH 03/10/2018    Priority: Medium  . CKD (chronic kidney disease), stage III ([place]) 03/04/2017    Priority: Medium  . Transaminitis 05/02/2015    Priority: Medium  . Adjustment disorder with anxiety 11/29/2013    Priority: Medium  . Intermittent claudication ([place]) 05/26/2012    Priority: Medium  . Hyperlipidemia 02/04/2010    Priority: Medium  . Essential hypertension 11/19/2006    Priority: Medium  . Cerumen impaction 03/04/2017    Priority: Low  . Varicose veins of right lower  extremity with pain     Priority: Low  . Macrocytosis without anemia 11/29/2013    Priority: Low  . Migraine   . Arthritis 11/15/2015   Past Surgical History:  Procedure Laterality Date  . ABDOMINAL HYSTERECTOMY  1979   TAH/LSO  . ABLATION SAPHENOUS VEIN W/ RFA    . BUNIONECTOMY WITH HAMMERTOE RECONSTRUCTION Left 10/13/2013   Procedure: LEFT FIRST METATARSAL SCARF OSTEOTOMY;  LEFT MODIFIED MCBRIDE BUNIONECTOMY AND SECOND HAMMERTOE CORRECTION;  Surgeon: Wylene Simmer, MD;  Location: [facility];  Service: Orthopedics;  Laterality: Left;  . CATARACT EXTRACTION Bilateral 09/2014   Dr. Talbert Forest  . CERVIX LESION DESTRUCTION  1970  . COLONOSCOPY    . COLONOSCOPY    . DILATION AND CURETTAGE OF UTERUS    . LASER ABLATION Bilateral    leg-left 8/14, right 12/14  . MASS EXCISION Left    hand  . osteopenia of hip  2017   T score -1.1  . PELVIC LAPAROSCOPY     d/t infertility  . right bartholin's gland cyst excision Right    1990s  . sympthectomy Left 2008   hand ([facility])    Family History  Problem Relation Age of Onset  . Stroke Mother   . Colon cancer Mother 65       dec with colon ca  . Heart attack Mother   . Migraines Mother   .  Stroke Father   . Heart attack Father   . Gallbladder disease Father 35       septic gallbladder  . Rheum arthritis Brother        dec age 55 ?Rheumatoid arthritis  . Diabetes Maternal Grandmother   . Esophageal cancer Neg Hx   . Stomach cancer Neg Hx   . Rectal cancer Neg Hx     Medications- reviewed and updated Current Outpatient Medications  Medication Sig Dispense Refill  . albuterol (PROAIR HFA) 108 (90 Base) MCG/ACT inhaler Inhale 1-2 puffs into the lungs every 6 (six) hours as needed for wheezing or shortness of breath. 18 g 0  . ALPRAZolam (XANAX) 0.5 MG tablet TAKE 1 TABLET EVERY 8 HOURS AS NEEDED FOR ANXIETY. 30 tablet 0  . amLODipine (NORVASC) 5 MG tablet Take 1 tablet by mouth 2 (two) times daily.     Marland Kitchen aspirin 81 MG  chewable tablet Chew 81 mg by mouth daily.    . cilostazol (PLETAL) 100 MG tablet Take 100 mg by mouth 2 (two) times daily.    . cyanocobalamin 1000 MCG tablet Take 1,000 mcg by mouth daily.    Marland Kitchen estradiol (CLIMARA - DOSED IN MG/24 HR) 0.0375 mg/24hr patch Place 0.0375 mg onto the skin once a week.    Marland Kitchen FLUoxetine (PROZAC) 40 MG capsule TAKE (1) CAPSULE DAILY. 90 capsule 0  . HYDROcodone-acetaminophen (NORCO) 10-325 MG tablet TAKE (1) TABLET EVERY FOUR TO SIX HOURS AS NEEDED. 30 tablet 0  . isosorbide mononitrate (IMDUR) 60 MG 24 hr tablet Take 60 mg by mouth daily.    . potassium chloride SA (K-DUR,KLOR-CON) 20 MEQ tablet TAKE 1 TABLET DAILY. 30 tablet 0  . rosuvastatin (CRESTOR) 10 MG tablet Take 1 tablet by mouth daily.    Marland Kitchen triamterene-hydrochlorothiazide (MAXZIDE-25) 37.5-25 MG per tablet Take 1 tablet by mouth daily.    Marland Kitchen EPINEPHrine 0.3 mg/0.3 mL IJ SOAJ injection Inject 0.3 mg into the muscle as needed for anaphylaxis. 2 each 0  . predniSONE (DELTASONE) 20 MG tablet Take 2-3 pills if has hives once daily maximum 10 tablet 0  . Rimegepant Sulfate (NURTEC) 75 MG TBDP Take 75 mg by mouth daily as needed. 15 tablet 5   No current facility-administered medications for this visit.    Allergies-reviewed and updated Allergies  Allergen Reactions  . Prednisone Swelling    short course=swelling    Social History   Social History Narrative   Married 28 years   3 adopted children   Objective  Objective:  BP 110/70   Pulse 61   Temp 98.2 F (36.8 C) (Temporal)   Ht 5\' 1"  (1.549 m)   Wt 137 lb 3.2 oz (62.2 kg)   LMP 03/31/1977   SpO2 98%   BMI 25.92 kg/m  Gen: NAD, resting comfortably HEENT: Mucous membranes are moist. Oropharynx normal Neck: no thyromegaly CV: RRR no murmurs rubs or gallops Lungs: CTAB no crackles, wheeze, rhonchi Abdomen: soft/nontender/nondistended/normal bowel sounds. No rebound or guarding.  Ext: no edema Skin: warm, dry Neuro: grossly normal, moves  all extremities, PERRLA   Assessment and Plan   74 y.o. female presenting for annual physical.  Health Maintenance counseling: 1. Anticipatory guidance: Patient counseled regarding regular dental exams -q3 months- alternates perio and dentist, eye exams - yearly for most part,  avoiding smoking and second hand smoke , limiting alcohol to 1 beverage per day .   2. Risk factor reduction:  Advised patient of need for regular exercise  and diet rich and fruits and vegetables to reduce risk of heart attack and stroke. 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 Diet- reasonably healthy diet- down 4 lbs from last physical.  Wt Readings from Last 3 Encounters:  03/15/20 137 lb 3.2 oz (62.2 kg)  09/08/19 135 lb (61.2 kg)  03/15/19 141 lb 12.8 oz (64.3 kg)  3. Immunizations/screenings/ancillary studies-discussed shingrix and tdap   Immunization History  Administered Date(s) Administered  . DTaP 12/25/2009  . Fluad Quad(high Dose 65+) 12/21/2018, 02/14/2020  . Influenza Split 01/17/2011  . Influenza, High Dose Seasonal PF 02/26/2015, 04/24/2016, 01/20/2017, 03/10/2018  . Influenza,inj,Quad PF,6+ Mos 01/07/2013, 01/13/2014  . PFIZER SARS-COV-2 Vaccination 05/06/2019, 05/27/2019, 12/06/2019  . Pneumococcal Conjugate-13 04/19/2013  . Pneumococcal Polysaccharide-23 05/02/2015  . Tdap 03/31/2008   4. Cervical cancer screening- hysterectomy and never had abnormal pap and passed age based screening requirements 5. Breast cancer screening-  mammogram  6 /3/21 6. Colon cancer screening - thankfully had conversation with Dr. Carlean Purl and colonoscopy now planned 2024-10 years instead of 7 years 7. Skin cancer screening- no dermatologist. advised regular sunscreen use. Denies worrisome, changing, or new skin lesions.  8. Birth control/STD check- hysterectomy, only active with husband 40. Osteoporosis screening at 43- 2017 slight osteopenia- wants to wait until next year for  repeat -former smoker- under a pack a day for 145 years- quit over 30 years ago. Will get UA  Status of chronic or acute concerns   #Hives- see problem oriented charting today  #Migraines- see problem oriented charting  #Vasculopathy #Hypercoagulable state [facility]) #Claudication- [facility]ology S: annual visits with [facility] with last in February with Dr. Lianne Moris and will see in march -On pletal/cilostazol 100mg  twice a day to help with claudication. -Known hypercoagulable state as she is heterozygous for prothrombin mutation but no history of arterial or venous thrombosis.  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   -She is on vasodilators amlodipine  5 mg BID and Imdur 60mg  to try to help  -trying to avoid nsaids  A/P: all issues appears stable- continue current meds  #Chronic bilateral low back pain with right-sided sciatica- on and off issues S: overall stable -Patient uses very sparing hydrocodone for migraines her flareups of back pain. -Typically #30 hydrocodone last at least 4-6 months. Last refil in november A/P: stable- continue current meds -03/15/2019 UDS- update today - PDMP reviewed - only refills through me -also on on alprazolam but knows to space by at least 8 hours from hydrocodone controlled substance contract on file from 2018- avoid alcohol within 8 hours as well   #hyperlipidemia/eleated LFTs in the past S: Medication: Rosuvastatin 10 mg every other day.  LDL goal at least under 100 but likely would prefer under 70 with microvascular disease  Lab Results  Component Value Date   CHOL 170 03/15/2019   HDL 80.70 03/15/2019   LDLCALC 79 03/15/2019   LDLDIRECT 61.0 05/02/2015   TRIG 49.0 03/15/2019   CHOLHDL 2 03/15/2019  Transaminitis-thought potentially related to statin use in the past.  Normal 2020  A/P: Hopefully lipids at least stable and LFTs at normal range-update lipids and LFTs today    #hypertension S: medication: Amlodipine 5 mg twice daily, Imdur 60 mg daily, transferring hydrochlorothiazide 37.5-25 mg.  Also takes potassium A/P: Reasonable control-continue current medication   #Chronic kidney disease stage III S: GFR is typically in the 50s range-has dipped into the 40s with  poor hydration in the past -Patient knows to avoid NSAIDs  A/P: Hopefully stable-update CMP today  # Adjustment disorder with anxiety S: Medication: Prozac 40 mg.  Sparing alprazolam a few times a month for anxiety element see discussion above about separation from hydrocodone A/P: reasonably stable- continue current meds   #Elevated TSH-slightly elevated TSH in the past.  Trend at least every 6 to 12 months.  Will update TSH with labs today Lab Results  Component Value Date   TSH 6.24 (H) 10/12/2019   #Reactive airways-albuterol as needed- better lately  -Issues when gets exposed to dust/pollen at times  #Macrocytosis-patient reports many years of issues.  B12 and folate have been normal in the past but high methylmalonic acid so she is taking B12.  Monitor CBC with labs today-discussed if other cell lines worsen could consider hematology consult- chronic history and wants to hold off   Recommended follow up: Return in about 6 months (around 09/13/2020) for follow up- or sooner if needed.  Lab/Order associations: fasting   ICD-10-CM   1. Preventative health care  Z00.00 CBC With Differential/Platelet    COMPLETE METABOLIC PANEL WITH GFR    Lipid Panel w/reflex Direct LDL    TSH    DRUG MONITORING, PANEL 8 WITH CONFIRMATION, URINE    Alpha-Gal Panel    POCT Urinalysis Dipstick (Automated)    Alpha-Gal Panel    DRUG MONITORING, PANEL 8 WITH CONFIRMATION, URINE    TSH    Lipid Panel w/reflex Direct LDL    COMPLETE METABOLIC PANEL WITH GFR    CBC With Differential/Platelet    POCT Urinalysis Dipstick (Automated)  2. Intractable migraine with status migrainosus, unspecified migraine  type  G43.911   3. Urticaria  L50.9   4. Vasculopathy  I99.9   5. Hypercoagulable state ([place])  D68.59   6. Chronic bilateral low back pain with right-sided sciatica  M54.41 DRUG MONITORING, PANEL 8 WITH CONFIRMATION, URINE   G89.29 DRUG MONITORING, PANEL 8 WITH CONFIRMATION, URINE  7. Intermittent claudication (HCC)  I73.9   8. Essential hypertension  I10 CBC With Differential/Platelet    COMPLETE METABOLIC PANEL WITH GFR    Lipid Panel w/reflex Direct LDL    POCT Urinalysis Dipstick (Automated)    Lipid Panel w/reflex Direct LDL    COMPLETE METABOLIC PANEL WITH GFR    CBC With Differential/Platelet    POCT Urinalysis Dipstick (Automated)  9. Stage 3 chronic kidney disease, unspecified whether stage 3a or 3b CKD (HCC)  N18.30   10. Adjustment disorder with anxiety  F43.22   11. Elevated TSH  R79.89 TSH    TSH  12. High risk medication use  Z79.899 DRUG MONITORING, PANEL 8 WITH CONFIRMATION, URINE    DRUG MONITORING, PANEL 8 WITH CONFIRMATION, URINE  13. Hives  L50.9 Alpha-Gal Panel    Alpha-Gal Panel    Return precautions advised.  Garret Reddish, MD

## 2020-03-15 ENCOUNTER — Encounter: Payer: Self-pay | Admitting: Family Medicine

## 2020-03-15 ENCOUNTER — Ambulatory Visit (INDEPENDENT_AMBULATORY_CARE_PROVIDER_SITE_OTHER): Payer: Medicare Other | Admitting: Family Medicine

## 2020-03-15 ENCOUNTER — Other Ambulatory Visit: Payer: Self-pay

## 2020-03-15 VITALS — BP 110/70 | HR 61 | Temp 98.2°F | Ht 61.0 in | Wt 137.2 lb

## 2020-03-15 DIAGNOSIS — M5441 Lumbago with sciatica, right side: Secondary | ICD-10-CM

## 2020-03-15 DIAGNOSIS — R7989 Other specified abnormal findings of blood chemistry: Secondary | ICD-10-CM

## 2020-03-15 DIAGNOSIS — I739 Peripheral vascular disease, unspecified: Secondary | ICD-10-CM

## 2020-03-15 DIAGNOSIS — F4322 Adjustment disorder with anxiety: Secondary | ICD-10-CM

## 2020-03-15 DIAGNOSIS — I999 Unspecified disorder of circulatory system: Secondary | ICD-10-CM

## 2020-03-15 DIAGNOSIS — G43909 Migraine, unspecified, not intractable, without status migrainosus: Secondary | ICD-10-CM | POA: Insufficient documentation

## 2020-03-15 DIAGNOSIS — Z0000 Encounter for general adult medical examination without abnormal findings: Secondary | ICD-10-CM

## 2020-03-15 DIAGNOSIS — Z79899 Other long term (current) drug therapy: Secondary | ICD-10-CM

## 2020-03-15 DIAGNOSIS — I10 Essential (primary) hypertension: Secondary | ICD-10-CM

## 2020-03-15 DIAGNOSIS — G43911 Migraine, unspecified, intractable, with status migrainosus: Secondary | ICD-10-CM

## 2020-03-15 DIAGNOSIS — N183 Chronic kidney disease, stage 3 unspecified: Secondary | ICD-10-CM

## 2020-03-15 DIAGNOSIS — L509 Urticaria, unspecified: Secondary | ICD-10-CM | POA: Diagnosis not present

## 2020-03-15 DIAGNOSIS — G8929 Other chronic pain: Secondary | ICD-10-CM

## 2020-03-15 DIAGNOSIS — D6859 Other primary thrombophilia: Secondary | ICD-10-CM

## 2020-03-15 LAB — POC URINALSYSI DIPSTICK (AUTOMATED)
Bilirubin, UA: NEGATIVE
Blood, UA: NEGATIVE
Glucose, UA: NEGATIVE
Ketones, UA: NEGATIVE
Leukocytes, UA: NEGATIVE
Nitrite, UA: NEGATIVE
Protein, UA: NEGATIVE
Spec Grav, UA: 1.015 (ref 1.010–1.025)
Urobilinogen, UA: 0.2 E.U./dL
pH, UA: 5.5 (ref 5.0–8.0)

## 2020-03-15 MED ORDER — PREDNISONE 20 MG PO TABS
ORAL_TABLET | ORAL | 0 refills | Status: DC
Start: 2020-03-15 — End: 2021-03-19

## 2020-03-15 MED ORDER — NURTEC 75 MG PO TBDP: 75.0000 mg | ORAL_TABLET | Freq: Every day | ORAL | 5 refills | Status: DC | PRN

## 2020-03-15 MED ORDER — EPINEPHRINE 0.3 MG/0.3ML IJ SOAJ: 0.3000 mg | INTRAMUSCULAR | 0 refills | Status: DC | PRN

## 2020-03-15 NOTE — Assessment & Plan Note (Signed)
S: patient broke out in hives a couple weeks ago   -Had tick bite in April with 2 occurrences of hives since that time. Most recent being most worrisome to her - patient states hand corn beef sandwich and potato pancake  And a few hours later had hives in her hands, chest, buttocks, then down to the leg. Some tongue swelling. Antihistamines helped with rash- also did 8 mg IM steroids that husband had available as retired Garment/textile technologist.. Also was given pepcid.  -had a hive reaction in June but not as severe- had a filet mignon at that time A/P: Patient now with 2 episodes of urticaria/hives after meat consumption-she would like to be tested for alpha gal which I think is very reasonable-ordered today -With reported tongue swelling I would like for her to have an EpiPen on hand if needed -Pending allergist visit next month-I encouraged her to keep this -They also ask about having prednisone on hand and I think that is reasonable-I discussed the taper which she did not tolerate tapers in the past and would prefer to do short bursts

## 2020-03-15 NOTE — Assessment & Plan Note (Addendum)
S:  due to vascular concerns avoids nsaids and triptans Patient reports even with hydrocodone she is on sometimes she has severe migraines still and may add nsaids. She would like another option for treatment A/P: Poor control of  Migraines when they occur at times but fortunately with no significant worsening of frequency. Husband is a retired Garment/textile technologist and suggests trial of [place]. I think this is reasonable- I would really like for her to avoid nsaids and im hopeful this may help.   - likely needs prior auth - we can try to submit this year or resubmit next year if needed. Fact she needs to avoid nsaids and triptans for vascular concerns if primary reason for trial  -she prefers not to see neurology at this time

## 2020-03-15 NOTE — Progress Notes (Signed)
Phone [phone] In person visit   Subjective:   Kari Welch is a 74 y.o. year old very pleasant female patient who presents for/with See problem oriented charting  This visit occurred during the SARS-CoV-2 public health emergency.  Safety protocols were in place, including screening questions prior to the visit, additional usage of staff PPE, and extensive cleaning of exam room while observing appropriate contact time as indicated for disinfecting solutions.   Past Medical History-  Patient Active Problem List   Diagnosis Date Noted  . Urticaria 03/15/2020    Priority: High  . Chronic back pain 11/29/2013    Priority: High  . Hypercoagulable state ([place]) 05/26/2012    Priority: High  . Paroxysmal digital cyanosis 05/26/2012    Priority: High  . Vasculopathy 11/29/2007    Priority: High  . Elevated TSH 03/10/2018    Priority: Medium  . CKD (chronic kidney disease), stage III ([place]) 03/04/2017    Priority: Medium  . Transaminitis 05/02/2015    Priority: Medium  . Adjustment disorder with anxiety 11/29/2013    Priority: Medium  . Intermittent claudication ([place]) 05/26/2012    Priority: Medium  . Hyperlipidemia 02/04/2010    Priority: Medium  . Essential hypertension 11/19/2006    Priority: Medium  . Cerumen impaction 03/04/2017    Priority: Low  . Varicose veins of right lower extremity with pain     Priority: Low  . Macrocytosis without anemia 11/29/2013    Priority: Low  . Migraine   . Arthritis 11/15/2015    Medications- reviewed and updated Current Outpatient Medications  Medication Sig Dispense Refill  . albuterol (PROAIR HFA) 108 (90 Base) MCG/ACT inhaler Inhale 1-2 puffs into the lungs every 6 (six) hours as needed for wheezing or shortness of breath. 18 g 0  . ALPRAZolam (XANAX) 0.5 MG tablet TAKE 1 TABLET EVERY 8 HOURS AS NEEDED FOR ANXIETY. 30 tablet 0  . amLODipine (NORVASC) 5 MG tablet Take 1 tablet by mouth 2 (two) times daily.     Marland Kitchen aspirin 81 MG  chewable tablet Chew 81 mg by mouth daily.    . cilostazol (PLETAL) 100 MG tablet Take 100 mg by mouth 2 (two) times daily.    . cyanocobalamin 1000 MCG tablet Take 1,000 mcg by mouth daily.    Marland Kitchen estradiol (CLIMARA - DOSED IN MG/24 HR) 0.0375 mg/24hr patch Place 0.0375 mg onto the skin once a week.    Marland Kitchen FLUoxetine (PROZAC) 40 MG capsule TAKE (1) CAPSULE DAILY. 90 capsule 0  . HYDROcodone-acetaminophen (NORCO) 10-325 MG tablet TAKE (1) TABLET EVERY FOUR TO SIX HOURS AS NEEDED. 30 tablet 0  . isosorbide mononitrate (IMDUR) 60 MG 24 hr tablet Take 60 mg by mouth daily.    . potassium chloride SA (K-DUR,KLOR-CON) 20 MEQ tablet TAKE 1 TABLET DAILY. 30 tablet 0  . rosuvastatin (CRESTOR) 10 MG tablet Take 1 tablet by mouth daily.    Marland Kitchen triamterene-hydrochlorothiazide (MAXZIDE-25) 37.5-25 MG per tablet Take 1 tablet by mouth daily.    Marland Kitchen EPINEPHrine 0.3 mg/0.3 mL IJ SOAJ injection Inject 0.3 mg into the muscle as needed for anaphylaxis. 2 each 0  . predniSONE (DELTASONE) 20 MG tablet Take 2-3 pills if has hives once daily maximum 10 tablet 0  . Rimegepant Sulfate (NURTEC) 75 MG TBDP Take 75 mg by mouth daily as needed. 15 tablet 5   No current facility-administered medications for this visit.     Objective:  BP 110/70   Pulse 61   Temp  98.2 F (36.8 C) (Temporal)   Ht 5\' 1"  (1.549 m)   Wt 137 lb 3.2 oz (62.2 kg)   LMP 03/31/1977   SpO2 98%   BMI 25.92 kg/m  Gen: NAD, resting comfortably     Assessment and Plan   Urticaria S: patient broke out in hives a couple weeks ago   -Had tick bite in April with 2 occurrences of hives since that time. Most recent being most worrisome to her - patient states hand corn beef sandwich and potato pancake  And a few hours later had hives in her hands, chest, buttocks, then down to the leg. Some tongue swelling. Antihistamines helped with rash- also did 8 mg IM steroids that husband had available as retired Garment/textile technologist.. Also was given pepcid.  -had a hive  reaction in June but not as severe- had a filet mignon at that time A/P: Patient now with 2 episodes of urticaria/hives after meat consumption-she would like to be tested for alpha gal which I think is very reasonable-ordered today -With reported tongue swelling I would like for her to have an EpiPen on hand if needed -Pending allergist visit next month-I encouraged her to keep this -They also ask about having prednisone on hand and I think that is reasonable-I discussed the taper which she did not tolerate tapers in the past and would prefer to do short bursts  Migraine S:  due to vascular concerns avoids nsaids and triptans Patient reports even with hydrocodone she is on sometimes she has severe migraines still and may add nsaids. She would like another option for treatment A/P: Poor control of  Migraines when they occur at times but fortunately with no significant worsening of frequency. Husband is a retired Garment/textile technologist and suggests trial of Calypso. I think this is reasonable- I would really like for her to avoid nsaids and im hopeful this may help.   - likely needs prior auth - we can try to submit this year or resubmit next year if needed. Fact she needs to avoid nsaids and triptans for vascular concerns if primary reason for trial  -she prefers not to see neurology at this time   Recommended follow up: Return in about 6 months (around 09/13/2020) for follow up- or sooner if needed.   Lab/Order associations:   ICD-10-CM   2. Intractable migraine with status migrainosus, unspecified migraine type  G43.911   3. Urticaria  L50.9     Meds ordered this encounter  Medications  . EPINEPHrine 0.3 mg/0.3 mL IJ SOAJ injection    Sig: Inject 0.3 mg into the muscle as needed for anaphylaxis.    Dispense:  2 each    Refill:  0  . predniSONE (DELTASONE) 20 MG tablet    Sig: Take 2-3 pills if has hives once daily maximum    Dispense:  10 tablet    Refill:  0  . Rimegepant Sulfate (NURTEC) 75 MG  TBDP    Sig: Take 75 mg by mouth daily as needed.    Dispense:  15 tablet    Refill:  5    Send prior auth request if needed    Return precautions advised.  Garret Reddish, MD

## 2020-03-15 NOTE — Patient Instructions (Addendum)
Health Maintenance Due  Topic Date Due  . TETANUS/TDAP consider at pharmacy 03/31/2018   Please check with your pharmacy to see if they have the shingrix vaccine. If they do- please get this immunization and update [place] by phone call or mychart with dates you receive the vaccine  Trial of nurtec  Thanks for doing labs If you have mychart- we will send your results within 3 business days of [place] receiving them.  If you do not have mychart- we will call you about results within 5 business days of [place] receiving them.  *please note we are currently using [facility] which has a longer processing time than [place] typically so labs may not come back as quickly as in the past *please also note that you will see labs on mychart as soon as they post. I will later go in and write notes on them- will say "notes from Dr. Yong Channel"   Recommended follow up: Return in about 6 months (around 09/13/2020) for follow up- or sooner if needed.

## 2020-03-17 ENCOUNTER — Encounter: Payer: Self-pay | Admitting: Family Medicine

## 2020-03-20 ENCOUNTER — Other Ambulatory Visit: Payer: Self-pay | Admitting: Family Medicine

## 2020-03-20 LAB — CBC WITH DIFFERENTIAL/PLATELET
Absolute Monocytes: 543 cells/uL (ref 200–950)
Basophils Absolute: 59 cells/uL (ref 0–200)
Basophils Relative: 1 %
Eosinophils Absolute: 201 cells/uL (ref 15–500)
Eosinophils Relative: 3.4 %
HCT: 41.6 % (ref 35.0–45.0)
Hemoglobin: 14.1 g/dL (ref 11.7–15.5)
Lymphs Abs: 1776 cells/uL (ref 850–3900)
MCH: 35.4 pg — ABNORMAL HIGH (ref 27.0–33.0)
MCHC: 33.9 g/dL (ref 32.0–36.0)
MCV: 104.5 fL — ABNORMAL HIGH (ref 80.0–100.0)
MPV: 9.9 fL (ref 7.5–12.5)
Monocytes Relative: 9.2 %
Neutro Abs: 3322 cells/uL (ref 1500–7800)
Neutrophils Relative %: 56.3 %
Platelets: 373 10*3/uL (ref 140–400)
RBC: 3.98 10*6/uL (ref 3.80–5.10)
RDW: 12.5 % (ref 11.0–15.0)
Total Lymphocyte: 30.1 %
WBC: 5.9 10*3/uL (ref 3.8–10.8)

## 2020-03-20 LAB — COMPLETE METABOLIC PANEL WITH GFR
AG Ratio: 1.3 (calc) (ref 1.0–2.5)
ALT: 16 U/L (ref 6–29)
AST: 20 U/L (ref 10–35)
Albumin: 3.9 g/dL (ref 3.6–5.1)
Alkaline phosphatase (APISO): 44 U/L (ref 37–153)
BUN/Creatinine Ratio: 14 (calc) (ref 6–22)
BUN: 15 mg/dL (ref 7–25)
CO2: 27 mmol/L (ref 20–32)
Calcium: 9.4 mg/dL (ref 8.6–10.4)
Chloride: 102 mmol/L (ref 98–110)
Creat: 1.05 mg/dL — ABNORMAL HIGH (ref 0.60–0.93)
GFR, Est African American: 61 mL/min/{1.73_m2} (ref >=60)
GFR, Est Non African American: 53 mL/min/{1.73_m2} — ABNORMAL LOW (ref >=60)
Globulin: 3 g/dL (calc) (ref 1.9–3.7)
Glucose, Bld: 82 mg/dL (ref 65–99)
Potassium: 3.9 mmol/L (ref 3.5–5.3)
Sodium: 137 mmol/L (ref 135–146)
Total Bilirubin: 0.4 mg/dL (ref 0.2–1.2)
Total Protein: 6.9 g/dL (ref 6.1–8.1)

## 2020-03-20 LAB — LIPID PANEL W/REFLEX DIRECT LDL
Cholesterol: 211 mg/dL — ABNORMAL HIGH (ref <200)
HDL: 87 mg/dL (ref >=50)
LDL Cholesterol (Calc): 110 mg/dL (calc) — ABNORMAL HIGH
Non-HDL Cholesterol (Calc): 124 mg/dL (calc) (ref <130)
Total CHOL/HDL Ratio: 2.4 (calc) (ref <5.0)
Triglycerides: 58 mg/dL (ref <150)

## 2020-03-20 LAB — DRUG MONITORING, PANEL 8 WITH CONFIRMATION, URINE
6 Acetylmorphine: NEGATIVE ng/mL (ref <10)
Alcohol Metabolites: NEGATIVE ng/mL
Amphetamines: NEGATIVE ng/mL (ref <500)
Benzodiazepines: NEGATIVE ng/mL (ref <100)
Buprenorphine, Urine: NEGATIVE ng/mL (ref <5)
Cocaine Metabolite: NEGATIVE ng/mL (ref <150)
Creatinine: 67.7 mg/dL
MDMA: NEGATIVE ng/mL (ref <500)
Marijuana Metabolite: NEGATIVE ng/mL (ref <20)
Opiates: NEGATIVE ng/mL (ref <100)
Oxidant: NEGATIVE ug/mL
Oxycodone: NEGATIVE ng/mL (ref <100)
pH: 5.5 (ref 4.5–9.0)

## 2020-03-20 LAB — DM TEMPLATE

## 2020-03-20 LAB — ALPHA-GAL PANEL
Beef IgE: 1.46 kU/L — ABNORMAL HIGH (ref <0.35)
Class: 1
Class: 2
Galactose-alpha-1,3-galactose IgE: 5.41 kU/L — ABNORMAL HIGH (ref <0.10)
LAMB/MUTTON IGE: 0.24 kU/L (ref <0.35)
Pork IgE: 0.48 kU/L — ABNORMAL HIGH (ref <0.35)

## 2020-03-20 LAB — TSH: TSH: 3.66 mIU/L (ref 0.40–4.50)

## 2020-03-20 MED ORDER — ROSUVASTATIN CALCIUM 5 MG PO TABS: 5.0000 mg | ORAL_TABLET | Freq: Every day | ORAL | 3 refills | Status: DC

## 2020-03-20 NOTE — Telephone Encounter (Signed)
I have not sent in this medication in the past-I believe cardiology actually filled this last has gynecology declined refill-since there is discrepancy between specialist I do not feel comfortable prescribing

## 2020-03-21 ENCOUNTER — Other Ambulatory Visit: Payer: Self-pay

## 2020-03-21 MED ORDER — ROSUVASTATIN CALCIUM 10 MG PO TABS
10.0000 mg | ORAL_TABLET | Freq: Every day | ORAL | 3 refills | Status: DC
Start: 2020-03-21 — End: 2022-03-28

## 2020-04-02 DIAGNOSIS — J309 Allergic rhinitis, unspecified: Secondary | ICD-10-CM | POA: Diagnosis not present

## 2020-04-02 DIAGNOSIS — L501 Idiopathic urticaria: Secondary | ICD-10-CM | POA: Diagnosis not present

## 2020-04-02 DIAGNOSIS — R059 Cough, unspecified: Secondary | ICD-10-CM | POA: Diagnosis not present

## 2020-04-02 DIAGNOSIS — T781XXA Other adverse food reactions, not elsewhere classified, initial encounter: Secondary | ICD-10-CM | POA: Diagnosis not present

## 2020-04-03 ENCOUNTER — Encounter: Payer: Self-pay | Admitting: Family Medicine

## 2020-04-11 ENCOUNTER — Telehealth: Payer: Self-pay

## 2020-04-11 NOTE — Telephone Encounter (Signed)
Prior authorization has been initiated on covermymeds. [id number] ICD 10 Code: D17.616  Your information has been submitted to [place]. [employer] [place] will review the request and notify you of the determination decision directly, typically within 3 business days of your submission and once all necessary information is received.  You will also receive your request decision electronically. To check for an update later, open the request again from your dashboard.  If Kari Welch Kari Welch has not responded within the specified timeframe or if you have any questions about your PA submission, contact [place] directly at [facility]) [phone] or ([place]) [phone].   I will update once a decision has been made.

## 2020-06-04 DIAGNOSIS — I10 Essential (primary) hypertension: Secondary | ICD-10-CM | POA: Diagnosis not present

## 2020-06-04 DIAGNOSIS — I739 Peripheral vascular disease, unspecified: Secondary | ICD-10-CM | POA: Diagnosis not present

## 2020-06-04 DIAGNOSIS — I7300 Raynaud's syndrome without gangrene: Secondary | ICD-10-CM | POA: Diagnosis not present

## 2020-06-04 DIAGNOSIS — I872 Venous insufficiency (chronic) (peripheral): Secondary | ICD-10-CM | POA: Diagnosis not present

## 2020-06-17 ENCOUNTER — Encounter: Payer: Self-pay | Admitting: Family Medicine

## 2020-06-20 ENCOUNTER — Telehealth: Payer: Self-pay

## 2020-06-20 NOTE — Telephone Encounter (Signed)
error 

## 2020-06-21 NOTE — Telephone Encounter (Signed)
Received call from [place] with [employer] about medication appeal for Nurtec. Belenda Cruise reported this medication has been approved. Call back number is [phone]. Please advise.

## 2020-07-05 ENCOUNTER — Encounter: Payer: Self-pay | Admitting: Family Medicine

## 2020-07-08 ENCOUNTER — Other Ambulatory Visit: Payer: Self-pay | Admitting: Family Medicine

## 2020-07-09 NOTE — Telephone Encounter (Signed)
Last refill for both 02/02/2020 Last OV 03/15/2020 dx Preventative health

## 2020-07-17 ENCOUNTER — Other Ambulatory Visit: Payer: Self-pay

## 2020-07-17 MED ORDER — NURTEC 75 MG PO TBDP: 75.0000 mg | ORAL_TABLET | Freq: Every day | ORAL | 5 refills | Status: DC | PRN

## 2020-09-18 ENCOUNTER — Encounter: Payer: Self-pay | Admitting: Family Medicine

## 2020-09-18 ENCOUNTER — Ambulatory Visit (INDEPENDENT_AMBULATORY_CARE_PROVIDER_SITE_OTHER): Payer: Medicare Other | Admitting: Family Medicine

## 2020-09-18 ENCOUNTER — Other Ambulatory Visit: Payer: Self-pay

## 2020-09-18 VITALS — BP 106/70 | HR 74 | Temp 98.1°F | Ht 61.0 in | Wt 136.8 lb

## 2020-09-18 DIAGNOSIS — E785 Hyperlipidemia, unspecified: Secondary | ICD-10-CM

## 2020-09-18 DIAGNOSIS — I10 Essential (primary) hypertension: Secondary | ICD-10-CM | POA: Diagnosis not present

## 2020-09-18 DIAGNOSIS — D6859 Other primary thrombophilia: Secondary | ICD-10-CM | POA: Diagnosis not present

## 2020-09-18 DIAGNOSIS — I739 Peripheral vascular disease, unspecified: Secondary | ICD-10-CM | POA: Diagnosis not present

## 2020-09-18 LAB — COMPREHENSIVE METABOLIC PANEL
ALT: 15 U/L (ref 0–35)
AST: 20 U/L (ref 0–37)
Albumin: 4.2 g/dL (ref 3.5–5.2)
Alkaline Phosphatase: 44 U/L (ref 39–117)
BUN: 18 mg/dL (ref 6–23)
CO2: 25 mEq/L (ref 19–32)
Calcium: 9.8 mg/dL (ref 8.4–10.5)
Chloride: 99 mEq/L (ref 96–112)
Creatinine, Ser: 1.31 mg/dL — ABNORMAL HIGH (ref 0.40–1.20)
GFR: 40.66 mL/min — ABNORMAL LOW (ref >60.00)
Glucose, Bld: 79 mg/dL (ref 70–99)
Potassium: 3.8 mEq/L (ref 3.5–5.1)
Sodium: 135 mEq/L (ref 135–145)
Total Bilirubin: 0.5 mg/dL (ref 0.2–1.2)
Total Protein: 7.6 g/dL (ref 6.0–8.3)

## 2020-09-18 LAB — LDL CHOLESTEROL, DIRECT: Direct LDL: 58 mg/dL

## 2020-09-18 NOTE — Patient Instructions (Addendum)
Health Maintenance Due  Topic Date Due   Zoster Vaccines- Shingrix (1 of 2)  Please check with your pharmacy to see if they have the shingrix vaccine. If they do- please get this immunization and update [place] by phone call or mychart with dates you receive the vaccine  Never done   TETANUS/TDAP   Cheaper at pharmacy 03/31/2018   COVID-19 Vaccine (4 - Booster for [employer] series)- let us know when you get this  03/06/2020   Thank you for persevering on the nurtec journey! Glad this was helpful with the bad migraines!   Please stop by lab before you go If you have mychart- we will send your results within 3 business days of [place] receiving them.  If you do not have mychart- we will call you about results within 5 business days of [place] receiving them.  *please also note that you will see labs on mychart as soon as they post. I will later go in and write notes on them- will say "notes from Dr. Yong Channel"    Recommended follow up: keep December visit

## 2020-09-18 NOTE — Progress Notes (Signed)
Phone [phone] In person visit   Subjective:   Kari Welch is a 75 y.o. year old very pleasant female patient who presents for/with See problem oriented charting Chief Complaint  Patient presents with   Migraine    Nurtec follow up   This visit occurred during the SARS-CoV-2 public health emergency.  Safety protocols were in place, including screening questions prior to the visit, additional usage of staff PPE, and extensive cleaning of exam room while observing appropriate contact time as indicated for disinfecting solutions.   Past Medical History-  Patient Active Problem List   Diagnosis Date Noted   Urticaria 03/15/2020    Priority: High   Chronic back pain 11/29/2013    Priority: High   Hypercoagulable state ([place]) 05/26/2012    Priority: High   Paroxysmal digital cyanosis 05/26/2012    Priority: High   Vasculopathy 11/29/2007    Priority: High   Elevated TSH 03/10/2018    Priority: Medium   CKD (chronic kidney disease), stage III ([place]) 03/04/2017    Priority: Medium   Transaminitis 05/02/2015    Priority: Medium   Adjustment disorder with anxiety 11/29/2013    Priority: Medium   Intermittent claudication ([place]) 05/26/2012    Priority: Medium   Hyperlipidemia 02/04/2010    Priority: Medium   Essential hypertension 11/19/2006    Priority: Medium   Cerumen impaction 03/04/2017    Priority: Low   Varicose veins of right lower extremity with pain     Priority: Low   Macrocytosis without anemia 11/29/2013    Priority: Low   Migraine    Arthritis 11/15/2015    Medications- reviewed and updated Current Outpatient Medications  Medication Sig Dispense Refill   albuterol (PROAIR HFA) 108 (90 Base) MCG/ACT inhaler Inhale 1-2 puffs into the lungs every 6 (six) hours as needed for wheezing or shortness of breath. 18 g 0   ALPRAZolam (XANAX) 0.5 MG tablet TAKE 1 TABLET EVERY 8 HOURS AS NEEDED FOR ANXIETY. 30 tablet 0   amLODipine (NORVASC) 5 MG tablet Take 1  tablet by mouth 2 (two) times daily.      aspirin 81 MG chewable tablet Chew 81 mg by mouth daily.     cilostazol (PLETAL) 100 MG tablet Take 100 mg by mouth 2 (two) times daily.     cyanocobalamin 1000 MCG tablet Take 1,000 mcg by mouth daily.     EPINEPHrine 0.3 mg/0.3 mL IJ SOAJ injection Inject 0.3 mg into the muscle as needed for anaphylaxis. 2 each 0   estradiol (CLIMARA - DOSED IN MG/24 HR) 0.0375 mg/24hr patch Place 0.0375 mg onto the skin once a week.     FLUoxetine (PROZAC) 40 MG capsule TAKE (1) CAPSULE DAILY. 90 capsule 0   HYDROcodone-acetaminophen (NORCO) 10-325 MG tablet TAKE (1) TABLET EVERY FOUR TO SIX HOURS AS NEEDED. 30 tablet 0   isosorbide mononitrate (IMDUR) 60 MG 24 hr tablet Take 60 mg by mouth daily.     potassium chloride SA (K-DUR,KLOR-CON) 20 MEQ tablet TAKE 1 TABLET DAILY. 30 tablet 0   Rimegepant Sulfate (NURTEC) 75 MG TBDP Take 75 mg by mouth daily as needed. 16 tablet 5   rosuvastatin (CRESTOR) 10 MG tablet Take 1 tablet (10 mg total) by mouth daily. 90 tablet 3   triamterene-hydrochlorothiazide (MAXZIDE-25) 37.5-25 MG per tablet Take 1 tablet by mouth daily.     predniSONE (DELTASONE) 20 MG tablet Take 2-3 pills if has hives once daily maximum (Patient not taking: Reported on 09/18/2020) 10  tablet 0   No current facility-administered medications for this visit.     Objective:  BP 106/70   Pulse 74   Temp 98.1 F (36.7 C) (Temporal)   Ht 5\' 1"  (1.549 m)   Wt 136 lb 12.8 oz (62.1 kg)   LMP 03/31/1977   SpO2 96%   BMI 25.85 kg/m  Gen: NAD, resting comfortably CV: RRR no murmurs rubs or gallops Lungs: CTAB no crackles, wheeze, rhonchi Abdomen: soft/nontender/nondistended/normal bowel sounds. Ext: no edema, 1+ DP/PT pulses Skin: warm, dry     Assessment and Plan  #Vasculopathy #Hypercoagulable state [facility]) #Claudication- [facility] cardiology S: Patient denies any recent issues with claudication. -On pletal/cilostazol 100mg  twice a day to help with  claudication. No clotting reported. -Known hypercoagulable state as she is heterozygous for prothrombin mutation but no history of arterial or venous thrombosis. 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  -She is on vasodilators amlodipine 5 mg BID and Imdur 60mg  to try to help  -trying to avoid nsaids-has taken some occasional Aleve and I discouraged this A/P: Vasculopathy/hypercoagulable state overall stable.  No recent claudication issues on cilostazol-continue current medication.  Also continue vasodilation-has not been recommended to be on anticoagulants-aspirin alone has been okay for 2  #Migraines- due to vascular concerns avoids nsaids and triptans. Trial nurtec 02/2020 to see if helpful -thankfully this finally got approved-she uses Nurtec very judiciously due to being costly- used twice. It is very helpful to her.  She is still having to use occasional Aleve or hydrocodone as stepup therapy-I discouraged use of Aleve due to hypercoagulable state.  #Chronic bilateral low back pain with right-sided sciatica- on and off issues S: Back pain is intermittent. Her arthritis in her IP joint of the right thumb and CMC joint on her left hand -Patient uses very sparing hydrocodone for migraines her flareups of back pain. -Typically #30 hydrocodone last at least 4 months- We are able to refill based off visit today when it is needed A/P: Overall stable medically recently-reports back has not been bad recently-currently using hydrocodone for migraines and sparingly at that - 03/15/2020 UDS - PDMP reviewed-refills only through me -also on on alprazolam but knows to space by at least 8 hours from hydrocodone controlled substance contract on file from 2018  -Recommended trial of Voltaren gel for joint pain and hand  #hyperlipidemia/eleated LFTs in the past S: Medication:Rosuvastatin 10 mg daily- change made within the last 6 to 12 months.  LDL goal at least under 100 but likely would prefer under 70 with microvascular disease  Transaminitis-thought potentially related to statin use in the past. Normal 2020 -she wants to wait until December 2022 to check LDL Lab Results  Component Value Date   CHOL 211 (H) 03/15/2020   HDL 87 03/15/2020   LDLCALC 110 (H) 03/15/2020   LDLDIRECT 61.0 05/02/2015   TRIG 58 03/15/2020   CHOLHDL 2.4 03/15/2020   A/P: Hopefully LFTs stable with increase in statin.  Hopefully LDL has improved-update direct LDL and continue current medications for now    #hypertension S: medication: Amlodipine 5 mg twice daily, Imdur 60 mg daily, triamterene hydrochlorothiazide 37.5-25 mg. Also takes potassium Home readings #s: none noted- she says they are good at home BP Readings from Last 3 Encounters:  09/18/20 106/70  03/15/20 110/70  03/15/19 110/62  A/P: Stable. Continue current medications.  #Chronic kidney disease stage III S: GFR is typically in the 50s range-has dipped into the  40s with poor hydration in the past -Patient knows to avoid NSAIDs  A/P: Recommended updating CMP today and avoiding NSAIDs-she agrees to trial this   # Adjustment disorder with anxiety S: Medication: Prozac 40 mg. Sparing alprazolam a few times a month for anxiety element A/P: Reasonable control recently-continue current medication  #Elevated TSH-slightly elevated TSH in the past. Trend at least every 6 to 12 months-normal on last check-defer until next visit Lab Results  Component Value Date   TSH 3.66 03/15/2020   #Reactive airways-albuterol as needed  -Issues when gets exposed to dust/pollen at times  #Macrocytosis-patient reports many years of issues. B12 and folate have been normal in the past but high methylmalonic acid so she is taking B12.  She states this is simply her baseline  #Alpha gal- hives starting 2021 after meat intake potentially. testing alpha gal. seeing allergist recommended. Epi pen as had tongue  swelling as well and gave dose for prednisone. She has no exposure to red meat lately.  No recurrence of issues -alpha gal positive - wants to test again  December 2022    Recommended follow up: No follow-ups on file. Future Appointments  Date Time Provider [place]  03/19/2021 11:20 AM Marin Olp, MD [facility]    Lab/Order associations:   ICD-10-CM   1. Essential hypertension  I10 Comprehensive metabolic panel    2. Hyperlipidemia, unspecified hyperlipidemia type  E78.5 Comprehensive metabolic panel    LDL cholesterol, direct    3. Intermittent claudication (HCC) Chronic I73.9     4. Hypercoagulable state ([place]) Chronic D68.59      I,Harris Phan,acting as a scribe for Garret Reddish, MD.,have documented all relevant documentation on the behalf of Garret Reddish, MD,as directed by  Garret Reddish, MD while in the presence of Garret Reddish, MD.   I, Garret Reddish, MD, have reviewed all documentation for this visit. The documentation on 09/18/20 for the exam, diagnosis, procedures, and orders are all accurate and complete.   Return precautions advised.  Garret Reddish, MD

## 2020-09-28 ENCOUNTER — Other Ambulatory Visit: Payer: Self-pay | Admitting: Family Medicine

## 2020-09-28 DIAGNOSIS — Z1231 Encounter for screening mammogram for malignant neoplasm of breast: Secondary | ICD-10-CM

## 2020-10-05 ENCOUNTER — Other Ambulatory Visit: Payer: Self-pay

## 2020-10-05 ENCOUNTER — Ambulatory Visit
Admission: RE | Admit: 2020-10-05 | Discharge: 2020-10-05 | Disposition: A | Discharge status: Home / Self Care | Payer: Medicare Other | Source: Ambulatory Visit | Attending: Family Medicine | Admitting: Family Medicine

## 2020-10-05 DIAGNOSIS — Z1231 Encounter for screening mammogram for malignant neoplasm of breast: Secondary | ICD-10-CM | POA: Diagnosis not present

## 2020-10-06 ENCOUNTER — Encounter: Payer: Self-pay | Admitting: Family Medicine

## 2020-10-10 ENCOUNTER — Other Ambulatory Visit: Payer: Self-pay | Admitting: Family Medicine

## 2020-10-10 DIAGNOSIS — R928 Other abnormal and inconclusive findings on diagnostic imaging of breast: Secondary | ICD-10-CM

## 2020-10-11 ENCOUNTER — Ambulatory Visit
Admission: RE | Admit: 2020-10-11 | Discharge: 2020-10-11 | Disposition: A | Discharge status: Home / Self Care | Payer: Medicare Other | Source: Ambulatory Visit | Attending: Family Medicine | Admitting: Family Medicine

## 2020-10-11 ENCOUNTER — Other Ambulatory Visit: Payer: Self-pay

## 2020-10-11 ENCOUNTER — Other Ambulatory Visit: Payer: Self-pay | Admitting: Family Medicine

## 2020-10-11 DIAGNOSIS — R928 Other abnormal and inconclusive findings on diagnostic imaging of breast: Secondary | ICD-10-CM

## 2020-10-11 DIAGNOSIS — R922 Inconclusive mammogram: Secondary | ICD-10-CM | POA: Diagnosis not present

## 2020-10-11 DIAGNOSIS — N6002 Solitary cyst of left breast: Secondary | ICD-10-CM | POA: Diagnosis not present

## 2020-10-11 DIAGNOSIS — R921 Mammographic calcification found on diagnostic imaging of breast: Secondary | ICD-10-CM

## 2020-10-30 ENCOUNTER — Other Ambulatory Visit: Payer: Medicare Other

## 2020-11-27 ENCOUNTER — Other Ambulatory Visit: Payer: Self-pay | Admitting: Family Medicine

## 2021-01-08 ENCOUNTER — Ambulatory Visit (INDEPENDENT_AMBULATORY_CARE_PROVIDER_SITE_OTHER): Payer: Medicare Other

## 2021-01-08 ENCOUNTER — Other Ambulatory Visit: Payer: Self-pay

## 2021-01-08 DIAGNOSIS — Z23 Encounter for immunization: Secondary | ICD-10-CM

## 2021-01-28 ENCOUNTER — Other Ambulatory Visit (HOSPITAL_BASED_OUTPATIENT_CLINIC_OR_DEPARTMENT_OTHER): Payer: Self-pay

## 2021-01-28 ENCOUNTER — Other Ambulatory Visit: Payer: Self-pay

## 2021-01-28 ENCOUNTER — Ambulatory Visit: Payer: Medicare Other | Attending: Internal Medicine

## 2021-01-28 DIAGNOSIS — Z23 Encounter for immunization: Secondary | ICD-10-CM

## 2021-01-28 MED ORDER — PFIZER COVID-19 VAC BIVALENT 30 MCG/0.3ML IM SUSP
INTRAMUSCULAR | 0 refills | Status: DC
  Filled 2021-01-28: qty 0.3, 1d supply, fill #0

## 2021-01-28 NOTE — Progress Notes (Signed)
   Covid-19 Vaccination Clinic  Name:  Capria Cartaya    MRN: [id number] DOB: 11-20-1945  01/28/2021  Ms. Scheller was observed post Covid-19 immunization for 15 minutes without incident. She was provided with Vaccine Information Sheet and instruction to access the V-Safe system.   Ms. Saldarriaga was instructed to call 911 with any severe reactions post vaccine: Difficulty breathing  Swelling of face and throat  A fast heartbeat  A bad rash all over body  Dizziness and weakness   Immunizations Administered     Name Date Dose VIS Date Route   Pfizer Covid-19 Vaccine Bivalent Booster 01/28/2021 11:26 AM 0.3 mL 11/28/2020 Intramuscular   Manufacturer: [place]   Lot: [id number]   [place]: [phone]

## 2021-03-19 ENCOUNTER — Encounter: Payer: Self-pay | Admitting: Family Medicine

## 2021-03-19 ENCOUNTER — Ambulatory Visit (INDEPENDENT_AMBULATORY_CARE_PROVIDER_SITE_OTHER): Payer: Medicare Other | Admitting: Family Medicine

## 2021-03-19 ENCOUNTER — Telehealth: Payer: Self-pay

## 2021-03-19 VITALS — BP 114/68 | HR 59 | Temp 97.7°F | Ht 60.0 in | Wt 136.4 lb

## 2021-03-19 DIAGNOSIS — N183 Chronic kidney disease, stage 3 unspecified: Secondary | ICD-10-CM

## 2021-03-19 DIAGNOSIS — Z0000 Encounter for general adult medical examination without abnormal findings: Secondary | ICD-10-CM | POA: Diagnosis not present

## 2021-03-19 DIAGNOSIS — Z79899 Other long term (current) drug therapy: Secondary | ICD-10-CM

## 2021-03-19 DIAGNOSIS — Z91018 Allergy to other foods: Secondary | ICD-10-CM

## 2021-03-19 DIAGNOSIS — E785 Hyperlipidemia, unspecified: Secondary | ICD-10-CM

## 2021-03-19 DIAGNOSIS — F4322 Adjustment disorder with anxiety: Secondary | ICD-10-CM

## 2021-03-19 DIAGNOSIS — I10 Essential (primary) hypertension: Secondary | ICD-10-CM | POA: Diagnosis not present

## 2021-03-19 DIAGNOSIS — M85852 Other specified disorders of bone density and structure, left thigh: Secondary | ICD-10-CM

## 2021-03-19 LAB — CBC WITH DIFFERENTIAL/PLATELET
Basophils Absolute: 0 10*3/uL (ref 0.0–0.1)
Basophils Relative: 0.7 % (ref 0.0–3.0)
Eosinophils Absolute: 0.2 10*3/uL (ref 0.0–0.7)
Eosinophils Relative: 3.3 % (ref 0.0–5.0)
HCT: 40.8 % (ref 36.0–46.0)
Hemoglobin: 13.8 g/dL (ref 12.0–15.0)
Lymphocytes Relative: 33.3 % (ref 12.0–46.0)
Lymphs Abs: 1.7 10*3/uL (ref 0.7–4.0)
MCHC: 34 g/dL (ref 30.0–36.0)
MCV: 104.4 fl — ABNORMAL HIGH (ref 78.0–100.0)
Monocytes Absolute: 0.5 10*3/uL (ref 0.1–1.0)
Monocytes Relative: 10.3 % (ref 3.0–12.0)
Neutro Abs: 2.6 10*3/uL (ref 1.4–7.7)
Neutrophils Relative %: 52.4 % (ref 43.0–77.0)
Platelets: 346 10*3/uL (ref 150.0–400.0)
RBC: 3.9 Mil/uL (ref 3.87–5.11)
RDW: 13.8 % (ref 11.5–15.5)
WBC: 5 10*3/uL (ref 4.0–10.5)

## 2021-03-19 LAB — LIPID PANEL
Cholesterol: 167 mg/dL (ref 0–200)
HDL: 90.2 mg/dL (ref >39.00)
LDL Cholesterol: 66 mg/dL (ref 0–99)
NonHDL: 76.7
Total CHOL/HDL Ratio: 2
Triglycerides: 54 mg/dL (ref 0.0–149.0)
VLDL: 10.8 mg/dL (ref 0.0–40.0)

## 2021-03-19 LAB — COMPREHENSIVE METABOLIC PANEL
ALT: 18 U/L (ref 0–35)
AST: 20 U/L (ref 0–37)
Albumin: 4.1 g/dL (ref 3.5–5.2)
Alkaline Phosphatase: 46 U/L (ref 39–117)
BUN: 20 mg/dL (ref 6–23)
CO2: 27 mEq/L (ref 19–32)
Calcium: 9.6 mg/dL (ref 8.4–10.5)
Chloride: 100 mEq/L (ref 96–112)
Creatinine, Ser: 0.97 mg/dL (ref 0.40–1.20)
GFR: 58.12 mL/min — ABNORMAL LOW (ref >60.00)
Glucose, Bld: 89 mg/dL (ref 70–99)
Potassium: 3.5 mEq/L (ref 3.5–5.1)
Sodium: 134 mEq/L — ABNORMAL LOW (ref 135–145)
Total Bilirubin: 0.4 mg/dL (ref 0.2–1.2)
Total Protein: 7.6 g/dL (ref 6.0–8.3)

## 2021-03-19 LAB — POC URINALSYSI DIPSTICK (AUTOMATED)
Bilirubin, UA: NEGATIVE
Blood, UA: NEGATIVE
Glucose, UA: NEGATIVE
Ketones, UA: NEGATIVE
Leukocytes, UA: NEGATIVE
Nitrite, UA: NEGATIVE
Protein, UA: NEGATIVE
Spec Grav, UA: <=1.005 — AB (ref 1.010–1.025)
Urobilinogen, UA: 0.2 E.U./dL
pH, UA: 6 (ref 5.0–8.0)

## 2021-03-19 LAB — TSH: TSH: 5.09 u[IU]/mL (ref 0.35–5.50)

## 2021-03-19 MED ORDER — NURTEC 75 MG PO TBDP: 75.0000 mg | ORAL_TABLET | Freq: Every day | ORAL | 5 refills | Status: DC | PRN

## 2021-03-19 NOTE — Progress Notes (Signed)
Phone [phone]   Subjective:  Patient presents today for their annual physical. Chief complaint-noted.   See problem oriented charting- ROS- full  review of systems was completed and negative except for: stbale light sensitivity with migraines, constipation stable, joint and back pain- worsening overtime including thumbs, seasonal allergies  The following were reviewed and entered/updated in epic: Past Medical History:  Diagnosis Date   Abnormal Pap smear    h/o   Allergy    seasonal   Arthritis    Collagen vascular disease ([place])    Elevated LFTs    Work up negative - Dr. Olevia Perches   Hyperlipidemia    Hyperplastic colon polyp    Hypertension    Internal hemorrhoids    INTERNAL HEMORRHOIDS 01/24/2009   Qualifier: Diagnosis of  By: Harlon Ditty CMA (AAMA), Dottie     Migraine    Prothrombin gene mutation ([place])    heterozygous   PVD (peripheral vascular disease) ([place])    takes Pletal to open blood flow of blood to fingers, toes, and lower extremities   Raynaud's disease    Vasculitis ([place])    Venous insufficiency    superficial left leg- seeing a vein specialist in August   Patient Active Problem List   Diagnosis Date Noted   Urticaria 03/15/2020    Priority: High   Chronic back pain 11/29/2013    Priority: High   Hypercoagulable state ([place]) 05/26/2012    Priority: High   Paroxysmal digital cyanosis 05/26/2012    Priority: High   Vasculopathy 11/29/2007    Priority: High   Elevated TSH 03/10/2018    Priority: Medium    CKD (chronic kidney disease), stage III ([place]) 03/04/2017    Priority: Medium    Transaminitis 05/02/2015    Priority: Medium    Adjustment disorder with anxiety 11/29/2013    Priority: Medium    Intermittent claudication ([place]) 05/26/2012    Priority: Medium    Hyperlipidemia 02/04/2010    Priority: Medium    Essential hypertension 11/19/2006    Priority: Medium    Cerumen impaction 03/04/2017    Priority: Low   Varicose veins of right  lower extremity with pain     Priority: Low   Macrocytosis without anemia 11/29/2013    Priority: Low   Migraine    Arthritis 11/15/2015   Past Surgical History:  Procedure Laterality Date   ABDOMINAL HYSTERECTOMY  1979   TAH/LSO   ABLATION SAPHENOUS VEIN W/ RFA     BUNIONECTOMY WITH HAMMERTOE RECONSTRUCTION Left 10/13/2013   Procedure: LEFT FIRST METATARSAL SCARF OSTEOTOMY;  LEFT MODIFIED MCBRIDE BUNIONECTOMY AND SECOND HAMMERTOE CORRECTION;  Surgeon: Wylene Simmer, MD;  Location: [facility];  Service: Orthopedics;  Laterality: Left;   CATARACT EXTRACTION Bilateral 09/2014   Dr. Talbert Forest   CERVIX LESION DESTRUCTION  1970   COLONOSCOPY     COLONOSCOPY     DILATION AND CURETTAGE OF UTERUS     LASER ABLATION Bilateral    leg-left 8/14, right 12/14   MASS EXCISION Left    hand   osteopenia of hip  2017   T score -1.1   PELVIC LAPAROSCOPY     d/t infertility   right bartholin's gland cyst excision Right    1990s   sympthectomy Left 2008   hand ([facility])    Family History  Problem Relation Age of Onset   Stroke Mother    Colon cancer Mother 90       dec with colon ca  Heart attack Mother    Migraines Mother    Stroke Father    Heart attack Father    Gallbladder disease Father 7       septic gallbladder   Rheum arthritis Brother        dec age 71 ?Rheumatoid arthritis   Diabetes Maternal Grandmother    Esophageal cancer Neg Hx    Stomach cancer Neg Hx    Rectal cancer Neg Hx     Medications- reviewed and updated Current Outpatient Medications  Medication Sig Dispense Refill   albuterol (PROAIR HFA) 108 (90 Base) MCG/ACT inhaler Inhale 1-2 puffs into the lungs every 6 (six) hours as needed for wheezing or shortness of breath. 18 g 0   ALPRAZolam (XANAX) 0.5 MG tablet TAKE 1 TABLET EVERY 8 HOURS AS NEEDED FOR ANXIETY. 30 tablet 0   amLODipine (NORVASC) 5 MG tablet Take 1 tablet by mouth 2 (two) times daily.      aspirin 81 MG chewable tablet Chew 81 mg by  mouth daily.     cilostazol (PLETAL) 100 MG tablet Take 100 mg by mouth 2 (two) times daily.     cyanocobalamin 1000 MCG tablet Take 1,000 mcg by mouth daily.     EPINEPHrine 0.3 mg/0.3 mL IJ SOAJ injection Inject 0.3 mg into the muscle as needed for anaphylaxis. 2 each 0   estradiol (CLIMARA - DOSED IN MG/24 HR) 0.0375 mg/24hr patch Place 0.0375 mg onto the skin once a week.     FLUoxetine (PROZAC) 40 MG capsule TAKE (1) CAPSULE DAILY. 90 capsule 0   HYDROcodone-acetaminophen (NORCO) 10-325 MG tablet TAKE (1) TABLET EVERY FOUR TO SIX HOURS AS NEEDED. 30 tablet 0   isosorbide mononitrate (IMDUR) 60 MG 24 hr tablet Take 60 mg by mouth daily.     potassium chloride SA (K-DUR,KLOR-CON) 20 MEQ tablet TAKE 1 TABLET DAILY. 30 tablet 0   Rimegepant Sulfate (NURTEC) 75 MG TBDP Take 75 mg by mouth daily as needed. 16 tablet 5   rosuvastatin (CRESTOR) 10 MG tablet Take 1 tablet (10 mg total) by mouth daily. 90 tablet 3   triamterene-hydrochlorothiazide (MAXZIDE-25) 37.5-25 MG per tablet Take 1 tablet by mouth daily.     No current facility-administered medications for this visit.    Allergies-reviewed and updated Allergies  Allergen Reactions   Prednisone Swelling    short course=swelling    Social History   Social History Narrative   Married 28 years   3 adopted children   Objective  Objective:  BP 114/68 (BP Location: Right Arm, Patient Position: Sitting, Cuff Size: Normal)    Pulse (!) 59    Temp 97.7 F (36.5 C) (Temporal)    Ht 5' (1.524 m)    Wt 136 lb 6.4 oz (61.9 kg)    LMP 03/31/1977    SpO2 98%    BMI 26.64 kg/m  Gen: NAD, resting comfortably HEENT: Mucous membranes are moist. Oropharynx normal Neck: no thyromegaly CV: RRR no murmurs rubs or gallops Lungs: CTAB no crackles, wheeze, rhonchi Abdomen: soft/nontender/nondistended/normal bowel sounds. No rebound or guarding.  Ext: no edema Skin: warm, dry Neuro: grossly normal, moves all extremities, PERRLA   Assessment and  Plan   75 y.o. female presenting for annual physical.  Health Maintenance counseling: 1. Anticipatory guidance: Patient counseled regarding regular dental exams -q3 months - alternates between periodontist and dentists, eye exams -yearly for most part,  avoiding smoking and second hand smoke , limiting alcohol to 1 beverage per day- well  under , no illicit drugs .   2. Risk factor reduction:  Advised patient of need for regular exercise and diet rich and fruits and vegetables to reduce risk of heart attack and stroke.  Exercise- still active with pottery- encouraged goal 150 minutes a week of exercise.  Diet/weight management-reasonably healthy diet. Weigh down by 1 lbs from last physical.   Wt Readings from Last 3 Encounters:  03/19/21 136 lb 6.4 oz (61.9 kg)  09/18/20 136 lb 12.8 oz (62.1 kg)  03/15/20 137 lb 3.2 oz (62.2 kg)  3. Immunizations/screenings/ancillary studies- discuss [place]/Tdap-at the pharmacy recommended- otherwise up-to-date. Immunization History  Administered Date(s) Administered   DTaP 12/25/2009   Fluad Quad(high Dose 65+) 12/21/2018, 02/14/2020, 01/08/2021   Influenza Split 01/17/2011   Influenza, High Dose Seasonal PF 02/26/2015, 04/24/2016, 01/20/2017, 03/10/2018   Influenza,inj,Quad PF,6+ Mos 01/07/2013, 01/13/2014   PFIZER(Purple Top)SARS-COV-2 Vaccination 05/06/2019, 05/27/2019, 12/06/2019   Pfizer Covid-19 Vaccine Bivalent Booster 39yrs & up 01/28/2021   Pneumococcal Conjugate-13 04/19/2013   Pneumococcal Polysaccharide-23 05/02/2015   Tdap 03/31/2008   Zoster Recombinat (Shingrix) 10/03/2020, 03/01/2021  4. Cervical cancer screening- hysterectomy and never had abnormal pap and past age based screening requirements  5. Breast cancer screening- mammogram 10/11/2020 and yearly recommended ultrasound and diagnostic mammogram which were completed-has upcoming 46-month ultrasound in January With impression from 10/11/2020 "IMPRESSION: 1. Probably benign small  group of calcifications in the retroareolar left breast, short-term follow-up recommended. 2. Benign left breast cysts.   RECOMMENDATION: 1. Diagnostic left breast mammography with magnification views in 6 months."  6. Colon cancer screening - last colonoscopy done 11/03/2012 with a 10-year repeat recommended 7. Skin cancer screening- No dermatologist. Advised regular sunscreen use. Denies worrisome, changing, or new skin lesions.  8. Birth control/STD check- hysterectomy and only active with husband 65. Osteoporosis screening at 66- 2017 slight osteopenia- wanted to wait this year for repeat-offered today- refered 10. Smoking associated screening - former smoker- under a pack a day for 45 years - patient quit over 31 years ago. Will get UA   Status of chronic or acute concerns   #Vasculopathy #Hypercoagulable state [facility]) #Claudication- [facility] cardiology S: overall stable. On her aspirin 81 mg daily  -On pletal/cilostazol 100mg  twice a day to help with claudication. -Known hypercoagulable state as she is heterozygous for prothrombin mutation but no history of arterial or venous thrombosis. 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  -She is on vasodilators amlodipine 5 mg BID and Imdur 60mg  to try to help  -trying to avoid nsaids  A/P: stbale- continue current meds  #Migraines- due to vascular concerns avoids nsaids and triptans. Trial nurtec 02/2020 to see if helpful and thankfully was very helpful. Very expensive. A lot of headaches but as far as migraines severe perhaps 2-3x a year- starts with aleve  (have advised against)and then goes to nurtec- she would use more if she could but only gets 8 at a time- uses hydrocodone as bck up- prefers to avoid with constipation.  #Chronic bilateral low back pain with right-sided sciatica- on and off issues S: - patient was occasionally using aleve for migraines as above - I discouraged  this due to hypercoagulable state. -Patient uses very sparing hydrocodone for migraines her flareups of back pain. -Typically #30 hydrocodone last at least 4 months. May use half A/P: migraines and back pain pretty stable- prefers to have more nurtec but having insurance restrictoins- still ideally limit to twice a week even  if we can get more.  -03/15/2020 UDS - PDMP reviewed-low risk refill pattern-last refilled in August -also on on alprazolam but knows to space by at least 8 hours from hydrocodone controlled substance contract on file from 2018   #hyperlipidemia/elevated LFTs in the past S: Medication:Rosuvastatin 10 mg every day. LDL goal at least under 100 but likely would prefer under 70 with microvascular disease-we will [place] goal last year Transaminitis-thought potentially related to statin use in the past. Normal 2020 Lab Results  Component Value Date   CHOL 211 (H) 03/15/2020   HDL 87 03/15/2020   LDLCALC 110 (H) 03/15/2020   LDLDIRECT 58.0 09/18/2020   TRIG 58 03/15/2020   CHOLHDL 2.4 03/15/2020  A/P: Hopefully LFTs remain normal and LDL remains under 70-update full lipid panel today- doing vey well last time  #hypertension S: medication: Amlodipine 5 mg twice daily, Imdur 60 mg daily, triamterene hydrochlorothiazide 37.5-25 mg. Also takes potassium BP Readings from Last 3 Encounters:  03/19/21 114/68  09/18/20 106/70  03/15/20 110/70  A/P:  Controlled. Continue current medications.    #Chronic kidney disease stage III S: GFR is typically in the 50s range-has dipped into the 40s with poor hydration in the past  A/P: Hopefully stable-update CMP with labs today-Patient knows to avoid NSAIDs    # Adjustment disorder with anxiety S: Medication: Prozac 40 mg. Sparing alprazolam a few times a month for anxiety element A/P: reasonable control- continue current meds. No SI.    #Elevated TSH-slightly elevated TSH in the past. Trend at least every 6 to 12 months  Lab Results   Component Value Date   TSH 3.66 03/15/2020   #Reactive airways-albuterol as needed-can refill if needed -Issues when gets exposed to dust/pollen at times  #Macrocytosis-patient reported many years of issues. B12 and folate have been normal in the past but high methylmalonic acid so she is taking B12   # alpha gal- hives starting 2021 after meat intake potentially. testing alpha gal. seeing allergist previously- not seeing regularly - only doing pork. Epi pen as had tongue swelling as well and gave dose for prednisone. She has epi pen on hand -alpha gal positive - wants to repeat levels  Recommended follow up: Return in about 6 months (around 09/17/2021) for a follow-up or sooner if needed - can be a virtual visit. Future Appointments  Date Time Provider [place]  04/16/2021 11:40 AM [facility] DIAG TOMO 1 [facility]     Lab/Order associations: fasting   ICD-10-CM   1. Preventative health care  Z00.00     2. Essential hypertension  I10 POCT Urinalysis Dipstick (Automated)    CBC with Differential/Platelet    Comprehensive metabolic panel    Lipid panel    TSH    3. Hyperlipidemia, unspecified hyperlipidemia type  E78.5 POCT Urinalysis Dipstick (Automated)    CBC with Differential/Platelet    Comprehensive metabolic panel    Lipid panel    TSH    4. Stage 3 chronic kidney disease, unspecified whether stage 3a or 3b CKD (HCC)  N18.30 POCT Urinalysis Dipstick (Automated)    Comprehensive metabolic panel    5. Adjustment disorder with anxiety  F43.22     6. Allergy to alpha-gal  Z91.018 Alpha-Gal Panel    7. Osteopenia of left femoral neck  M85.852 DG Bone Density    8. High risk medication use  Z79.899 DRUG MONITORING, PANEL 8 WITH CONFIRMATION, URINE      No orders of the defined types  were placed in this encounter.  I,Harris Phan,acting as a Education administrator for Garret Reddish, MD.,have documented all relevant documentation on the behalf of Garret Reddish, MD,as  directed by  Garret Reddish, MD while in the presence of Garret Reddish, MD.   I, Garret Reddish, MD, have reviewed all documentation for this visit. The documentation on 03/19/21 for the exam, diagnosis, procedures, and orders are all accurate and complete.   Return precautions advised.  Garret Reddish, MD

## 2021-03-19 NOTE — Telephone Encounter (Signed)
Refill request for Nurtec. Per patient she had requested refill for 16 tablets of Nurtec while in office for visit but would like to change this to 8 tablets for insurance purposes until the beginning of the year. Please advise.

## 2021-03-19 NOTE — Addendum Note (Signed)
Addended by: Marin Olp on: 03/19/2021 12:49 PM   Modules accepted: Orders

## 2021-03-19 NOTE — Telephone Encounter (Signed)
I sent in a new prescription for 8 instead-please let patient know

## 2021-03-19 NOTE — Telephone Encounter (Signed)
Left detailed message informing patient

## 2021-03-19 NOTE — Patient Instructions (Addendum)
Health Maintenance Due  Topic Date Due   TETANUS/TDAP  - Recommend getting this at your local pharmacy as it would be cheaper for you there. Please let  me know if you have this completed.  03/31/2018   Please stop by lab before you go If you have mychart- we will send your results within 3 business days of [place] receiving them.  If you do not have mychart- we will call you about results within 5 business days of [place] receiving them.  *please also note that you will see labs on mychart as soon as they post. I will later go in and write notes on them- will say "notes from Dr. Yong Channel"  Schedule your bone density test at check out desk.  - located [address] across the street from [place] - in the basement - you DO NEED an appointment for the bone density tests.   Encourage you start getting at least 150 minutes of regular exercise per week.  Recommended follow up: Return in about 6 months (around 09/17/2021) for a follow-up or sooner if needed - can be a virtual visit.

## 2021-03-20 LAB — DRUG MONITORING, PANEL 8 WITH CONFIRMATION, URINE
6 Acetylmorphine: NEGATIVE ng/mL (ref <10)
Alcohol Metabolites: NEGATIVE ng/mL (ref <500)
Amphetamines: NEGATIVE ng/mL (ref <500)
Benzodiazepines: NEGATIVE ng/mL (ref <100)
Buprenorphine, Urine: NEGATIVE ng/mL (ref <5)
Cocaine Metabolite: NEGATIVE ng/mL (ref <150)
Creatinine: 20.2 mg/dL (ref >=20.0)
MDMA: NEGATIVE ng/mL (ref <500)
Marijuana Metabolite: NEGATIVE ng/mL (ref <20)
Opiates: NEGATIVE ng/mL (ref <100)
Oxidant: NEGATIVE ug/mL (ref <200)
Oxycodone: NEGATIVE ng/mL (ref <100)
pH: 6.8 (ref 4.5–9.0)

## 2021-03-20 LAB — DM TEMPLATE

## 2021-03-26 LAB — ALPHA-GAL PANEL
Beef IgE: 0.45 kU/L — ABNORMAL HIGH (ref <0.35)
Class: 0
Class: 1
Galactose-alpha-1,3-galactose IgE: 1.28 kU/L — ABNORMAL HIGH (ref <0.10)
LAMB/MUTTON IGE: <0.1 kU/L (ref <0.35)
Pork IgE: 0.11 kU/L (ref <0.35)

## 2021-04-15 ENCOUNTER — Other Ambulatory Visit: Payer: Self-pay | Admitting: Family Medicine

## 2021-04-16 ENCOUNTER — Ambulatory Visit
Admission: RE | Admit: 2021-04-16 | Discharge: 2021-04-16 | Disposition: A | Discharge status: Home / Self Care | Payer: Medicare Other | Source: Ambulatory Visit | Attending: Family Medicine | Admitting: Family Medicine

## 2021-04-16 ENCOUNTER — Other Ambulatory Visit: Payer: Self-pay | Admitting: Family Medicine

## 2021-04-16 DIAGNOSIS — R921 Mammographic calcification found on diagnostic imaging of breast: Secondary | ICD-10-CM

## 2021-04-16 DIAGNOSIS — R922 Inconclusive mammogram: Secondary | ICD-10-CM | POA: Diagnosis not present

## 2021-05-17 ENCOUNTER — Inpatient Hospital Stay: Admission: RE | Admit: 2021-05-17 | Payer: Medicare Other | Source: Ambulatory Visit

## 2021-05-20 ENCOUNTER — Telehealth: Payer: Self-pay | Admitting: Family Medicine

## 2021-05-20 NOTE — Telephone Encounter (Signed)
Pt states she is having a severe body rash all of over her body. Dr Yong Channel had no openings available. I was able to schedule her with Dr Cherlynn Kaiser on 2/21 at 10:30am. She wanted to make sure Dr. Yong Channel knows what is going on.

## 2021-05-20 NOTE — Telephone Encounter (Signed)
Dr. Cherlynn Kaiser takes great care of folks- I will be on lookout for her note.

## 2021-05-20 NOTE — Telephone Encounter (Signed)
FYI

## 2021-05-21 ENCOUNTER — Other Ambulatory Visit: Payer: Self-pay

## 2021-05-21 ENCOUNTER — Encounter: Payer: Self-pay | Admitting: Family Medicine

## 2021-05-21 ENCOUNTER — Ambulatory Visit: Payer: Medicare Other | Admitting: Family Medicine

## 2021-05-21 ENCOUNTER — Ambulatory Visit (INDEPENDENT_AMBULATORY_CARE_PROVIDER_SITE_OTHER): Payer: Medicare Other | Admitting: Family Medicine

## 2021-05-21 VITALS — BP 120/76 | HR 66 | Temp 98.0°F | Ht 60.0 in | Wt 139.0 lb

## 2021-05-21 DIAGNOSIS — T781XXA Other adverse food reactions, not elsewhere classified, initial encounter: Secondary | ICD-10-CM | POA: Diagnosis not present

## 2021-05-21 DIAGNOSIS — Z91018 Allergy to other foods: Secondary | ICD-10-CM | POA: Diagnosis not present

## 2021-05-21 DIAGNOSIS — R21 Rash and other nonspecific skin eruption: Secondary | ICD-10-CM | POA: Diagnosis not present

## 2021-05-21 MED ORDER — METHYLPREDNISOLONE ACETATE 80 MG/ML IJ SUSP
80.0000 mg | Freq: Once | INTRAMUSCULAR | Status: AC
  Administered 2021-05-21: 80 mg via INTRAMUSCULAR

## 2021-05-21 MED ORDER — DEXAMETHASONE 4 MG PO TABS: ORAL_TABLET | ORAL | 0 refills | Status: DC

## 2021-05-21 MED ORDER — ONDANSETRON 4 MG PO TBDP: 4.0000 mg | ORAL_TABLET | Freq: Three times a day (TID) | ORAL | 0 refills | Status: DC | PRN

## 2021-05-21 NOTE — Progress Notes (Signed)
Phone [phone] In person visit   Subjective:   Kari Welch is a 76 y.o. year old very pleasant female patient who presents for/with See problem oriented charting Chief Complaint  Patient presents with   Rash    Pt c/o rash all over that she noticed last Wednesday. She has tried lidocaine spray, and benadryl.   This visit occurred during the SARS-CoV-2 public health emergency.  Safety protocols were in place, including screening questions prior to the visit, additional usage of staff PPE, and extensive cleaning of exam room while observing appropriate contact time as indicated for disinfecting solutions.   Past Medical History-  Patient Active Problem List   Diagnosis Date Noted   Allergy to alpha-gal 05/21/2021    Priority: High   Urticaria 03/15/2020    Priority: High   Chronic back pain 11/29/2013    Priority: High   Hypercoagulable state ([place]) 05/26/2012    Priority: High   Paroxysmal digital cyanosis 05/26/2012    Priority: High   Vasculopathy 11/29/2007    Priority: High   Elevated TSH 03/10/2018    Priority: Medium    CKD (chronic kidney disease), stage III ([place]) 03/04/2017    Priority: Medium    Transaminitis 05/02/2015    Priority: Medium    Adjustment disorder with anxiety 11/29/2013    Priority: Medium    Intermittent claudication ([place]) 05/26/2012    Priority: Medium    Hyperlipidemia 02/04/2010    Priority: Medium    Essential hypertension 11/19/2006    Priority: Medium    Cerumen impaction 03/04/2017    Priority: Low   Varicose veins of right lower extremity with pain     Priority: Low   Macrocytosis without anemia 11/29/2013    Priority: Low   Migraine    Arthritis 11/15/2015    Medications- reviewed and updated Current Outpatient Medications  Medication Sig Dispense Refill   albuterol (PROAIR HFA) 108 (90 Base) MCG/ACT inhaler Inhale 1-2 puffs into the lungs every 6 (six) hours as needed for wheezing or shortness of breath. 18 g 0    ALPRAZolam (XANAX) 0.5 MG tablet TAKE 1 TABLET EVERY 8 HOURS AS NEEDED FOR ANXIETY. 30 tablet 0   amLODipine (NORVASC) 5 MG tablet Take 1 tablet by mouth 2 (two) times daily.      aspirin 81 MG chewable tablet Chew 81 mg by mouth daily.     cilostazol (PLETAL) 100 MG tablet Take 100 mg by mouth 2 (two) times daily.     cyanocobalamin 1000 MCG tablet Take 1,000 mcg by mouth daily.     dexamethasone (DECADRON) 4 MG tablet Take three times daily for 2 days,  twice a day for 2 days, once a day for 1 days then stop 11 tablet 0   EPINEPHrine 0.3 mg/0.3 mL IJ SOAJ injection Inject 0.3 mg into the muscle as needed for anaphylaxis. 2 each 0   estradiol (CLIMARA - DOSED IN MG/24 HR) 0.0375 mg/24hr patch Place 0.0375 mg onto the skin once a week.     FLUoxetine (PROZAC) 40 MG capsule TAKE (1) CAPSULE DAILY. 90 capsule 0   HYDROcodone-acetaminophen (NORCO) 10-325 MG tablet TAKE (1) TABLET EVERY FOUR TO SIX HOURS AS NEEDED. 30 tablet 0   isosorbide mononitrate (IMDUR) 60 MG 24 hr tablet Take 60 mg by mouth daily.     ondansetron (ZOFRAN-ODT) 4 MG disintegrating tablet Take 1 tablet (4 mg total) by mouth every 8 (eight) hours as needed for nausea or vomiting. 20 tablet 0  potassium chloride SA (K-DUR,KLOR-CON) 20 MEQ tablet TAKE 1 TABLET DAILY. 30 tablet 0   Rimegepant Sulfate (NURTEC) 75 MG TBDP Take 75 mg by mouth daily as needed. 8 tablet 5   rosuvastatin (CRESTOR) 10 MG tablet Take 1 tablet (10 mg total) by mouth daily. 90 tablet 3   triamterene-hydrochlorothiazide (MAXZIDE-25) 37.5-25 MG per tablet Take 1 tablet by mouth daily.     No current facility-administered medications for this visit.     Objective:  BP 120/76    Pulse 66    Temp 98 F (36.7 C)    Ht 5' (1.524 m)    Wt 139 lb (63 kg)    LMP 03/31/1977    SpO2 95%    BMI 27.15 kg/m  Gen: NAD, resting comfortably CV: RRR no murmurs rubs or gallops Lungs: CTAB no crackles, wheeze, rhonchi Ext: no edema Skin: diffuse maculopapular rash- wit  multiple areas of excoriation and actively scratching during visit- only spares face, palms and soles    Assessment and Plan    #Rash/ alpha gal allergy S: Rash started last Wednesday-she has tried benadryl and calamine spray  and lidocaine spray (only short term relief) and Benadryl (too groggy with this though)- OTC allegra 180 at night in morning doing 5 mg of levo cetirizine . She had a chicken sub sandwich from [employer] on Tuesday (concerned may be cross contaminated with alpha gal history). Started on back of neck. Spares palms and soles and face but pretty much present on every other area. Very pruritic- poor sleep from the itching- has even bruised herself from intense itching.  -in past had gained over 10 lbs on prednisone and then got ingrown toenail. Thursday took 40 mg of prednisone previously sent back- then 20 mg on next day (Friday) and then Saturday (took 40 mg). Rash/itching was better- took a day off on Sunday and then Monday did 7 mg of dexamethasone. Rash is better at this point. Has even had to take xanax.   ROS-not ill appearing, no fever/chills. No new medications. Not immunocompromised. No mucus membrane involvement. No lip or tongue swelling or trouble breathing A/P:  rash is not in distribution typical for contact dermatitis. Severely impacting quality of life. No red flags. I am concnered about alpha gal reaction with cross contamination or cooking surface. Did well with consistent prednisone use at home (had some old available) but she has been hesitant about use and when skips a day symptoms have flared.  -Has had swelling on prednisone in the past and prefers decadron or methylprednisolone 80 mg -she was given methylprednisolone alone today at 80 mg with plan to start decadron taper if symptoms worsen within 48-72 hours of use - will step up antihistamine (see avs). Husband is retired Garment/textile technologist and knows to cut back if dealing with over sedation. If she gets up at  night she is to wake him up.  -has seeen allergist in past- could refer back if needed -if recurrent issues with this rash- plan on updating bloodwork and could even consider punch biopsy or derm referral  # migraines S:patient with history of migraines - pretty stable lately A/P:  will continue current meds but husband asks about having zofran on hand when she has nausea with migraines- very reasonable and rx provided today  Recommended follow up: Return for as needed for new, worsening, persistent symptoms. Future Appointments  Date Time Provider [place]  06/19/2021  1:30 PM [facility] DEXA 1 [facility]  10/16/2021  1:30 PM [facility] DIAG TOMO 2 [facility]  03/20/2022  9:40 AM Kierstin January, Brayton Mars, MD [facility]    Lab/Order associations:   ICD-10-CM   1. Rash  R21 methylPREDNISolone acetate (DEPO-MEDROL) injection 80 mg    2. Allergic reaction to alpha-gal  T78.1XXA methylPREDNISolone acetate (DEPO-MEDROL) injection 80 mg    3. Allergy to alpha-gal  Z91.018       Meds ordered this encounter  Medications   dexamethasone (DECADRON) 4 MG tablet    Sig: Take three times daily for 2 days,  twice a day for 2 days, once a day for 1 days then stop    Dispense:  11 tablet    Refill:  0   ondansetron (ZOFRAN-ODT) 4 MG disintegrating tablet    Sig: Take 1 tablet (4 mg total) by mouth every 8 (eight) hours as needed for nausea or vomiting.    Dispense:  20 tablet    Refill:  0   methylPREDNISolone acetate (DEPO-MEDROL) injection 80 mg   Time Spent: 31 minutes of total time (3:12 PM- 3:43 PM )was spent on the date of the encounter performing the following actions: chart review prior to seeing the patient, obtaining history, performing a medically necessary exam, counseling on the treatment plan, placing orders, and documenting in our EHR.   Return precautions advised.  Garret Reddish, MD

## 2021-05-21 NOTE — Patient Instructions (Addendum)
Depomedrol 80mg  today injection  If symptoms worsen at all in next 48-72 hours can take the decadron taper we discussed  I like your idea of  taking allegra in AM and levocetirizine in PM then taking benadryl 50mg  every 6 hours but watch for sedation. Could also add pepcid 20 mg twice daily with meals.   Recommended follow up: Return for as needed for new, worsening, persistent symptoms.

## 2021-06-04 ENCOUNTER — Telehealth (INDEPENDENT_AMBULATORY_CARE_PROVIDER_SITE_OTHER): Payer: Medicare Other | Admitting: Family Medicine

## 2021-06-04 ENCOUNTER — Other Ambulatory Visit (HOSPITAL_BASED_OUTPATIENT_CLINIC_OR_DEPARTMENT_OTHER): Payer: Self-pay

## 2021-06-04 DIAGNOSIS — U071 COVID-19: Secondary | ICD-10-CM | POA: Diagnosis not present

## 2021-06-04 MED ORDER — NIRMATRELVIR/RITONAVIR (PAXLOVID) TABLET (RENAL DOSING)
2.0000 | ORAL_TABLET | Freq: Two times a day (BID) | ORAL | 0 refills | Status: AC
Start: 2021-06-04 — End: 2021-06-10
  Filled 2021-06-04: qty 20, 5d supply, fill #0

## 2021-06-04 NOTE — Telephone Encounter (Signed)
?Phone [phone] ?Virtual visit via phonenote ?Subjective:  ? ?Chief Complaint  ?Patient presents with  ? Covid Positive  ? ?This visit type was conducted due to national recommendations for restrictions regarding the COVID-19 Pandemic (e.g. social distancing).  This format is felt to be most appropriate for this patient at this time balancing risks to patient and risks to population by having him in for in person visit.  All issues noted in this document were discussed and addressed.  No physical exam was performed (except for noted visual exam or audio findings with Telehealth visits).  The patient has consented to conduct a Telehealth visit and understands insurance will be billed.  ? ?Our team/I connected with Maryelizabeth Rowan Bermingham at  by phone (patient did not have equipment for webex) and verified that I am speaking with the correct person using two identifiers.  ?Location patient: Home-[place] ?Location provider: [facility], office ?Persons participating in the virtual visit:  patient ? ?Time on phone: 14 minutes ? ?Our team/I discussed the limitations of evaluation and management by telemedicine and the availability of in person appointments. In light of current covid-19 pandemic, patient also understands that we are trying to protect them by minimizing in office contact if at all possible.  The patient expressed consent for telemedicine visit and agreed to proceed. Patient understands insurance will be billed.  ? ?Past Medical History-  ?Patient Active Problem List  ? Diagnosis Date Noted  ? Allergy to alpha-gal 05/21/2021  ?  Priority: High  ? Urticaria 03/15/2020  ?  Priority: High  ? Chronic back pain 11/29/2013  ?  Priority: High  ? Hypercoagulable state ([place]) 05/26/2012  ?  Priority: High  ? Paroxysmal digital cyanosis 05/26/2012  ?  Priority: High  ? Vasculopathy 11/29/2007  ?  Priority: High  ? Elevated TSH 03/10/2018  ?  Priority: Medium   ? CKD (chronic kidney disease), stage III ([place]) 03/04/2017  ?   Priority: Medium   ? Transaminitis 05/02/2015  ?  Priority: Medium   ? Adjustment disorder with anxiety 11/29/2013  ?  Priority: Medium   ? Intermittent claudication ([place]) 05/26/2012  ?  Priority: Medium   ? Hyperlipidemia 02/04/2010  ?  Priority: Medium   ? Essential hypertension 11/19/2006  ?  Priority: Medium   ? Cerumen impaction 03/04/2017  ?  Priority: Low  ? Varicose veins of right lower extremity with pain   ?  Priority: Low  ? Macrocytosis without anemia 11/29/2013  ?  Priority: Low  ? Migraine   ? Arthritis 11/15/2015  ? ? ?Medications- reviewed and updated ?Current Outpatient Medications  ?Medication Sig Dispense Refill  ? nirmatrelvir/ritonavir EUA, renal dosing, (PAXLOVID) 10 x 150 MG & 10 x '100MG'$  TABS Take 2 tablets by mouth 2 (two) times daily for 5 days. (Take nirmatrelvir 150 mg one tablet twice daily for 5 days and ritonavir 100 mg one tablet twice daily for 5 days) Patient GFR is 58 20 tablet 0  ? albuterol (PROAIR HFA) 108 (90 Base) MCG/ACT inhaler Inhale 1-2 puffs into the lungs every 6 (six) hours as needed for wheezing or shortness of breath. 18 g 0  ? ALPRAZolam (XANAX) 0.5 MG tablet TAKE 1 TABLET EVERY 8 HOURS AS NEEDED FOR ANXIETY. 30 tablet 0  ? amLODipine (NORVASC) 5 MG tablet Take 1 tablet by mouth 2 (two) times daily.     ? aspirin 81 MG chewable tablet Chew 81 mg by mouth daily.    ? cilostazol (PLETAL) 100  MG tablet Take 100 mg by mouth 2 (two) times daily.    ? cyanocobalamin 1000 MCG tablet Take 1,000 mcg by mouth daily.    ? EPINEPHrine 0.3 mg/0.3 mL IJ SOAJ injection Inject 0.3 mg into the muscle as needed for anaphylaxis. 2 each 0  ? estradiol (CLIMARA - DOSED IN MG/24 HR) 0.0375 mg/24hr patch Place 0.0375 mg onto the skin once a week.    ? FLUoxetine (PROZAC) 40 MG capsule TAKE (1) CAPSULE DAILY. 90 capsule 0  ? HYDROcodone-acetaminophen (NORCO) 10-325 MG tablet TAKE (1) TABLET EVERY FOUR TO SIX HOURS AS NEEDED. 30 tablet 0  ? isosorbide mononitrate (IMDUR) 60 MG 24 hr tablet  Take 60 mg by mouth daily.    ? ondansetron (ZOFRAN-ODT) 4 MG disintegrating tablet Take 1 tablet (4 mg total) by mouth every 8 (eight) hours as needed for nausea or vomiting. 20 tablet 0  ? potassium chloride SA (K-DUR,KLOR-CON) 20 MEQ tablet TAKE 1 TABLET DAILY. 30 tablet 0  ? Rimegepant Sulfate (NURTEC) 75 MG TBDP Take 75 mg by mouth daily as needed. 8 tablet 5  ? rosuvastatin (CRESTOR) 10 MG tablet Take 1 tablet (10 mg total) by mouth daily. 90 tablet 3  ? triamterene-hydrochlorothiazide (MAXZIDE-25) 37.5-25 MG per tablet Take 1 tablet by mouth daily.    ? ?No current facility-administered medications for this visit.  ? ?Objective:  ?See flow sheet for vitals ?Nonlabored voice, normal speech  ? ? ?Assessment and Plan  ? ?# Covid 19 ?S:symptoms started on Saturday evening- persistent cough. Sore throat, fatigue, loose stools noted- change in taste as well. No chills or fever. Also has mental fog.  A lot of sinus congestion and sneezing. Some significant headaches atypical of migraines. No significant improvement- slightly worse today.  ?A/P: Patient with testing confirming covid 19 with first day of covid 19 symptoms Saturday 06/01/20 ?Vaccination status: fully vaccinated including bivalent vaccination  ? ?Therefore: ?- recommended patient watch closely for shortness of breath or confusion or worsening symptoms and if those occur patient should contact us immediately or seek care in the emergency department ?-recommended patient consider purchasing pulse oximeter and if levels 94% or below persistently- seek care at the hospital ?- Patient needs to self isolate  for at least 5 days since first symptom AND at least 24 hours fever free without fever reducing medications AND have improvement in respiratory symptoms . After 5 days can end self isolation but still needs to wear mask for additional 5 days .  ?-since  High risk for complications-we discussed outpatient therapeutic options including paxlovid (and risk of  rebound), molnupiravir ?- patient opted for paxlovid- renally dosed. Needs to wait to start until tomorrow morning- at least 24 hours out from rosuvastatin and hold rosuvastatin until finishing paxlovid ?  ? ?Recommended follow up: as needed for new or worsening symptoms ? ?Lab/Order associations: ?  ICD-10-CM   ?1. COVID-19  U07.1   ?  ? ? ?Meds ordered this encounter  ?Medications  ? nirmatrelvir/ritonavir EUA, renal dosing, (PAXLOVID) 10 x 150 MG & 10 x '100MG'$  TABS  ?  Sig: Take 2 tablets by mouth 2 (two) times daily for 5 days. (Take nirmatrelvir 150 mg one tablet twice daily for 5 days and ritonavir 100 mg one tablet twice daily for 5 days) Patient GFR is 58  ?  Dispense:  20 tablet  ?  Refill:  0  ? ? ?Return precautions advised.  ?Garret Reddish, MD ? ? ?

## 2021-06-17 DIAGNOSIS — I872 Venous insufficiency (chronic) (peripheral): Secondary | ICD-10-CM | POA: Diagnosis not present

## 2021-06-17 DIAGNOSIS — I7300 Raynaud's syndrome without gangrene: Secondary | ICD-10-CM | POA: Diagnosis not present

## 2021-06-17 DIAGNOSIS — I739 Peripheral vascular disease, unspecified: Secondary | ICD-10-CM | POA: Diagnosis not present

## 2021-06-17 DIAGNOSIS — E782 Mixed hyperlipidemia: Secondary | ICD-10-CM | POA: Diagnosis not present

## 2021-06-19 ENCOUNTER — Other Ambulatory Visit: Payer: Self-pay

## 2021-06-19 ENCOUNTER — Ambulatory Visit (INDEPENDENT_AMBULATORY_CARE_PROVIDER_SITE_OTHER)
Admission: RE | Admit: 2021-06-19 | Discharge: 2021-06-19 | Disposition: A | Discharge status: Home / Self Care | Payer: Medicare Other | Source: Ambulatory Visit | Attending: Family Medicine | Admitting: Family Medicine

## 2021-06-19 DIAGNOSIS — M85852 Other specified disorders of bone density and structure, left thigh: Secondary | ICD-10-CM | POA: Diagnosis not present

## 2021-06-20 ENCOUNTER — Encounter: Payer: Self-pay | Admitting: Family Medicine

## 2021-06-20 DIAGNOSIS — M85852 Other specified disorders of bone density and structure, left thigh: Secondary | ICD-10-CM | POA: Diagnosis not present

## 2021-07-22 ENCOUNTER — Telehealth: Payer: Self-pay | Admitting: Family Medicine

## 2021-07-22 NOTE — Telephone Encounter (Signed)
Spouse would like for the pt to be worked into the schedule. ?

## 2021-07-22 NOTE — Telephone Encounter (Signed)
Is the spouse wanting to be worked in? Or is he calling in to have the patient worked in? ?

## 2021-07-22 NOTE — Telephone Encounter (Signed)
Spouse of pt is requesting to be worked in to Dr. Ansel Bong schedule. ? ?Approve or Deny? ? ?Pt's spouse states pt has been experiencing fatigue, stomach upset, and wonders if this is long-COVID-19.  ? ?Spouse states that pt will be going on vacation next week and is wanting a well check to see if pt is up for the trip. ?

## 2021-07-23 NOTE — Telephone Encounter (Signed)
Ok to work in.

## 2021-07-23 NOTE — Telephone Encounter (Signed)
8 am Thursday opened up - sending to see if we can get her worked in  ?

## 2021-07-23 NOTE — Telephone Encounter (Signed)
I tried to add her to that slot- can yall confirm I did this correct and confirm with patient that this time works? Thanks!  ?

## 2021-07-24 NOTE — Telephone Encounter (Signed)
Yes, it was put in correctly. Patient did confirm and will be here. ?

## 2021-07-24 NOTE — Telephone Encounter (Signed)
Noted  

## 2021-07-25 ENCOUNTER — Other Ambulatory Visit: Payer: Self-pay | Admitting: Family Medicine

## 2021-07-25 ENCOUNTER — Ambulatory Visit: Payer: Medicare Other | Admitting: Family Medicine

## 2021-07-26 ENCOUNTER — Other Ambulatory Visit: Payer: Self-pay | Admitting: Family Medicine

## 2021-07-26 NOTE — Telephone Encounter (Signed)
Refill appropriate 

## 2021-07-29 NOTE — Telephone Encounter (Signed)
Id prefer an update on her condition to understand refill request ?

## 2021-07-31 NOTE — Telephone Encounter (Signed)
Called and lm for pt tcb to see if infact she needs this refill. ?

## 2021-08-06 ENCOUNTER — Other Ambulatory Visit: Payer: Self-pay

## 2021-08-06 MED ORDER — ALBUTEROL SULFATE HFA 108 (90 BASE) MCG/ACT IN AERS: 1.0000 | INHALATION_SPRAY | Freq: Four times a day (QID) | RESPIRATORY_TRACT | 0 refills | Status: DC | PRN

## 2021-09-12 DIAGNOSIS — H26493 Other secondary cataract, bilateral: Secondary | ICD-10-CM | POA: Diagnosis not present

## 2021-10-03 ENCOUNTER — Other Ambulatory Visit: Payer: Self-pay | Admitting: Family Medicine

## 2021-10-15 ENCOUNTER — Encounter (HOSPITAL_COMMUNITY): Payer: Medicare Other

## 2021-10-16 ENCOUNTER — Ambulatory Visit
Admission: RE | Admit: 2021-10-16 | Discharge: 2021-10-16 | Disposition: A | Discharge status: Home / Self Care | Payer: Medicare Other | Source: Ambulatory Visit | Attending: Family Medicine | Admitting: Family Medicine

## 2021-10-16 DIAGNOSIS — R921 Mammographic calcification found on diagnostic imaging of breast: Secondary | ICD-10-CM | POA: Diagnosis not present

## 2021-11-12 DIAGNOSIS — H18413 Arcus senilis, bilateral: Secondary | ICD-10-CM | POA: Diagnosis not present

## 2021-11-12 DIAGNOSIS — Z961 Presence of intraocular lens: Secondary | ICD-10-CM | POA: Diagnosis not present

## 2021-11-12 DIAGNOSIS — H26493 Other secondary cataract, bilateral: Secondary | ICD-10-CM | POA: Diagnosis not present

## 2021-11-12 DIAGNOSIS — H04123 Dry eye syndrome of bilateral lacrimal glands: Secondary | ICD-10-CM | POA: Diagnosis not present

## 2021-12-23 ENCOUNTER — Encounter: Payer: Self-pay | Admitting: *Deleted

## 2021-12-26 ENCOUNTER — Other Ambulatory Visit (HOSPITAL_BASED_OUTPATIENT_CLINIC_OR_DEPARTMENT_OTHER): Payer: Self-pay

## 2021-12-26 MED ORDER — ESTRADIOL 0.0375 MG/24HR TD PTWK
0.0375 mg | MEDICATED_PATCH | TRANSDERMAL | 3 refills | Status: DC
Start: 2021-06-17 — End: 2023-05-12
  Filled 2021-12-26: qty 12, 84d supply, fill #0
  Filled 2022-03-26: qty 12, 84d supply, fill #1

## 2021-12-27 ENCOUNTER — Other Ambulatory Visit (HOSPITAL_BASED_OUTPATIENT_CLINIC_OR_DEPARTMENT_OTHER): Payer: Self-pay

## 2021-12-30 ENCOUNTER — Other Ambulatory Visit (HOSPITAL_BASED_OUTPATIENT_CLINIC_OR_DEPARTMENT_OTHER): Payer: Self-pay

## 2022-01-15 ENCOUNTER — Other Ambulatory Visit (HOSPITAL_BASED_OUTPATIENT_CLINIC_OR_DEPARTMENT_OTHER): Payer: Self-pay

## 2022-01-15 MED ORDER — COMIRNATY 30 MCG/0.3ML IM SUSY
PREFILLED_SYRINGE | INTRAMUSCULAR | 0 refills | Status: DC
  Filled 2022-01-15: qty 0.3, 1d supply, fill #0

## 2022-01-15 MED ORDER — INFLUENZA VAC A&B SA ADJ QUAD 0.5 ML IM PRSY
PREFILLED_SYRINGE | INTRAMUSCULAR | 0 refills | Status: DC
  Filled 2022-01-15: qty 0.5, 1d supply, fill #0

## 2022-02-13 ENCOUNTER — Encounter: Payer: Self-pay | Admitting: Family Medicine

## 2022-03-20 ENCOUNTER — Ambulatory Visit (INDEPENDENT_AMBULATORY_CARE_PROVIDER_SITE_OTHER): Payer: Medicare Other | Admitting: Family Medicine

## 2022-03-20 ENCOUNTER — Encounter: Payer: Self-pay | Admitting: Family Medicine

## 2022-03-20 VITALS — BP 122/82 | HR 55 | Temp 97.9°F | Ht 60.0 in | Wt 142.2 lb

## 2022-03-20 DIAGNOSIS — E785 Hyperlipidemia, unspecified: Secondary | ICD-10-CM

## 2022-03-20 DIAGNOSIS — D6859 Other primary thrombophilia: Secondary | ICD-10-CM | POA: Diagnosis not present

## 2022-03-20 DIAGNOSIS — Z79899 Other long term (current) drug therapy: Secondary | ICD-10-CM | POA: Diagnosis not present

## 2022-03-20 DIAGNOSIS — I10 Essential (primary) hypertension: Secondary | ICD-10-CM | POA: Diagnosis not present

## 2022-03-20 DIAGNOSIS — Z91018 Allergy to other foods: Secondary | ICD-10-CM

## 2022-03-20 DIAGNOSIS — Z0000 Encounter for general adult medical examination without abnormal findings: Secondary | ICD-10-CM | POA: Diagnosis not present

## 2022-03-20 DIAGNOSIS — R7989 Other specified abnormal findings of blood chemistry: Secondary | ICD-10-CM | POA: Diagnosis not present

## 2022-03-20 DIAGNOSIS — Z1283 Encounter for screening for malignant neoplasm of skin: Secondary | ICD-10-CM

## 2022-03-20 DIAGNOSIS — I739 Peripheral vascular disease, unspecified: Secondary | ICD-10-CM | POA: Diagnosis not present

## 2022-03-20 LAB — COMPREHENSIVE METABOLIC PANEL
ALT: 10 U/L (ref 0–35)
AST: 16 U/L (ref 0–37)
Albumin: 4 g/dL (ref 3.5–5.2)
Alkaline Phosphatase: 44 U/L (ref 39–117)
BUN: 20 mg/dL (ref 6–23)
CO2: 30 mEq/L (ref 19–32)
Calcium: 10.1 mg/dL (ref 8.4–10.5)
Chloride: 101 mEq/L (ref 96–112)
Creatinine, Ser: 0.97 mg/dL (ref 0.40–1.20)
GFR: 57.71 mL/min — ABNORMAL LOW (ref >60.00)
Glucose, Bld: 95 mg/dL (ref 70–99)
Potassium: 3.8 mEq/L (ref 3.5–5.1)
Sodium: 140 mEq/L (ref 135–145)
Total Bilirubin: 0.4 mg/dL (ref 0.2–1.2)
Total Protein: 7 g/dL (ref 6.0–8.3)

## 2022-03-20 LAB — URINALYSIS, ROUTINE W REFLEX MICROSCOPIC
Bilirubin Urine: NEGATIVE
Hgb urine dipstick: NEGATIVE
Ketones, ur: NEGATIVE
Leukocytes,Ua: NEGATIVE
Nitrite: NEGATIVE
RBC / HPF: NONE SEEN (ref 0–?)
Specific Gravity, Urine: 1.01 (ref 1.000–1.030)
Total Protein, Urine: NEGATIVE
Urine Glucose: NEGATIVE
Urobilinogen, UA: 0.2 (ref 0.0–1.0)
pH: 7 (ref 5.0–8.0)

## 2022-03-20 LAB — CBC WITH DIFFERENTIAL/PLATELET
Basophils Absolute: 0 10*3/uL (ref 0.0–0.1)
Basophils Relative: 0.7 % (ref 0.0–3.0)
Eosinophils Absolute: 0.2 10*3/uL (ref 0.0–0.7)
Eosinophils Relative: 3.6 % (ref 0.0–5.0)
HCT: 44.7 % (ref 36.0–46.0)
Hemoglobin: 14.9 g/dL (ref 12.0–15.0)
Lymphocytes Relative: 30.3 % (ref 12.0–46.0)
Lymphs Abs: 1.9 10*3/uL (ref 0.7–4.0)
MCHC: 33.4 g/dL (ref 30.0–36.0)
MCV: 104.5 fl — ABNORMAL HIGH (ref 78.0–100.0)
Monocytes Absolute: 0.6 10*3/uL (ref 0.1–1.0)
Monocytes Relative: 9.3 % (ref 3.0–12.0)
Neutro Abs: 3.5 10*3/uL (ref 1.4–7.7)
Neutrophils Relative %: 56.1 % (ref 43.0–77.0)
Platelets: 365 10*3/uL (ref 150.0–400.0)
RBC: 4.27 Mil/uL (ref 3.87–5.11)
RDW: 13.7 % (ref 11.5–15.5)
WBC: 6.3 10*3/uL (ref 4.0–10.5)

## 2022-03-20 LAB — TSH: TSH: 3.77 u[IU]/mL (ref 0.35–5.50)

## 2022-03-20 LAB — LIPID PANEL
Cholesterol: 181 mg/dL (ref 0–200)
HDL: 81.6 mg/dL (ref >39.00)
LDL Cholesterol: 82 mg/dL (ref 0–99)
NonHDL: 99.12
Total CHOL/HDL Ratio: 2
Triglycerides: 85 mg/dL (ref 0.0–149.0)
VLDL: 17 mg/dL (ref 0.0–40.0)

## 2022-03-20 MED ORDER — HYDROCODONE-ACETAMINOPHEN 10-325 MG PO TABS
ORAL_TABLET | ORAL | 0 refills | Status: DC
Start: 2022-03-20 — End: 2022-08-26

## 2022-03-20 MED ORDER — ALPRAZOLAM 0.5 MG PO TABS: ORAL_TABLET | ORAL | 0 refills | Status: DC

## 2022-03-20 NOTE — Patient Instructions (Addendum)
You are eligible to schedule your annual wellness visit with our nurse specialist Otila Kluver.  Please consider scheduling this before you leave today  -Td if get cut/scrape/bad bite  - advised trial claritin/allegra/ or xyzal for 2 weeks to see if makes difference  - you set a goal to get on treadmill 10 minutes a day for 5 days a week for at least 1 month  We will call you within two weeks about your referral to [facility] dermatology. If you do not hear within 2 weeks, give [place] a call.   Please stop by lab before you go If you have mychart- we will send your results within 3 business days of [place] receiving them.  If you do not have mychart- we will call you about results within 5 business days of [place] receiving them.  *please also note that you will see labs on mychart as soon as they post. I will later go in and write notes on them- will say "notes from Dr. Yong Channel"   Recommended follow up: Return in about 1 year (around 03/21/2023) for physical or sooner if needed.Schedule b4 you leave.

## 2022-03-20 NOTE — Progress Notes (Signed)
Phone [phone]   Subjective:  Patient presents today for their annual physical. Chief complaint-noted.   See problem oriented charting- ROS- full  review of systems was completed and negative except for: fatigue, light sensitivity, cough, leg swelling occasional, joint pain, back pain, heat intolerance, seasonal allergies, food allergies, headaches  The following were reviewed and entered/updated in epic: Past Medical History:  Diagnosis Date   Abnormal Pap smear    h/o   Allergy    seasonal   Arthritis    Collagen vascular disease ([place])    Elevated LFTs    Work up negative - Dr. Olevia Perches   Hyperlipidemia    Hyperplastic colon polyp    Hypertension    Internal hemorrhoids    INTERNAL HEMORRHOIDS 01/24/2009   Qualifier: Diagnosis of  By: Harlon Ditty CMA (AAMA), Dottie     Migraine    Prothrombin gene mutation ([place])    heterozygous   PVD (peripheral vascular disease) ([place])    takes Pletal to open blood flow of blood to fingers, toes, and lower extremities   Raynaud's disease    Vasculitis ([place])    Venous insufficiency    superficial left leg- seeing a vein specialist in August   Patient Active Problem List   Diagnosis Date Noted   Allergy to alpha-gal 05/21/2021    Priority: High   Urticaria 03/15/2020    Priority: High   Chronic back pain 11/29/2013    Priority: High   Hypercoagulable state ([place]) 05/26/2012    Priority: High   Paroxysmal digital cyanosis 05/26/2012    Priority: High   Vasculopathy 11/29/2007    Priority: High   Osteopenia of left femoral neck 06/20/2021    Priority: Medium    Elevated TSH 03/10/2018    Priority: Medium    CKD (chronic kidney disease), stage III ([place]) 03/04/2017    Priority: Medium    Transaminitis 05/02/2015    Priority: Medium    Adjustment disorder with anxiety 11/29/2013    Priority: Medium    Intermittent claudication ([place]) 05/26/2012    Priority: Medium    Hyperlipidemia 02/04/2010    Priority: Medium     Essential hypertension 11/19/2006    Priority: Medium    Cerumen impaction 03/04/2017    Priority: Low   Varicose veins of right lower extremity with pain     Priority: Low   Macrocytosis without anemia 11/29/2013    Priority: Low   Migraine    Arthritis 11/15/2015   Past Surgical History:  Procedure Laterality Date   ABDOMINAL HYSTERECTOMY  1979   TAH/LSO   ABLATION SAPHENOUS VEIN W/ RFA     BUNIONECTOMY WITH HAMMERTOE RECONSTRUCTION Left 10/13/2013   Procedure: LEFT FIRST METATARSAL SCARF OSTEOTOMY;  LEFT MODIFIED MCBRIDE BUNIONECTOMY AND SECOND HAMMERTOE CORRECTION;  Surgeon: Wylene Simmer, MD;  Location: [facility];  Service: Orthopedics;  Laterality: Left;   CATARACT EXTRACTION Bilateral 09/2014   Dr. Talbert Forest   CERVIX LESION DESTRUCTION  1970   COLONOSCOPY     COLONOSCOPY     DILATION AND CURETTAGE OF UTERUS     LASER ABLATION Bilateral    leg-left 8/14, right 12/14   MASS EXCISION Left    hand   osteopenia of hip  2017   T score -1.1   PELVIC LAPAROSCOPY     d/t infertility   right bartholin's gland cyst excision Right    1990s   sympthectomy Left 2008   hand ([facility])    Family History  Problem Relation  Age of Onset   Stroke Mother    Colon cancer Mother 58       dec with colon ca   Heart attack Mother    Migraines Mother    Stroke Father    Heart attack Father    Gallbladder disease Father 2       septic gallbladder   Rheum arthritis Brother        dec age 60 ?Rheumatoid arthritis   Diabetes Maternal Grandmother    Esophageal cancer Neg Hx    Stomach cancer Neg Hx    Rectal cancer Neg Hx     Medications- reviewed and updated Current Outpatient Medications  Medication Sig Dispense Refill   albuterol (PROAIR HFA) 108 (90 Base) MCG/ACT inhaler Inhale 1-2 puffs into the lungs every 6 (six) hours as needed for wheezing or shortness of breath. 18 g 0   ALPRAZolam (XANAX) 0.5 MG tablet TAKE 1 TABLET EVERY 8 HOURS AS NEEDED FOR ANXIETY. Do not drive  for 8 hours after use. 30 tablet 0   amLODipine (NORVASC) 5 MG tablet Take 1 tablet by mouth 2 (two) times daily.      aspirin 81 MG chewable tablet Chew 81 mg by mouth daily.     cilostazol (PLETAL) 100 MG tablet Take 100 mg by mouth 2 (two) times daily.     cyanocobalamin 1000 MCG tablet Take 1,000 mcg by mouth daily.     EPINEPHrine 0.3 mg/0.3 mL IJ SOAJ injection Inject 0.3 mg into the muscle as needed for anaphylaxis. 2 each 0   estradiol (CLIMARA - DOSED IN MG/24 HR) 0.0375 mg/24hr patch Place 1 patch (0.0375 mg total) onto the skin once a week. 12 patch 3   FLUoxetine (PROZAC) 40 MG capsule TAKE (1) CAPSULE DAILY. 90 capsule 0   HYDROcodone-acetaminophen (NORCO) 10-325 MG tablet TAKE (1) TABLET EVERY FOUR TO SIX HOURS AS NEEDED. Do not drive for 6 hours after use. 30 tablet 0   isosorbide mononitrate (IMDUR) 60 MG 24 hr tablet Take 60 mg by mouth daily.     ondansetron (ZOFRAN-ODT) 4 MG disintegrating tablet Take 1 tablet (4 mg total) by mouth every 8 (eight) hours as needed for nausea or vomiting. 20 tablet 0   potassium chloride SA (K-DUR,KLOR-CON) 20 MEQ tablet TAKE 1 TABLET DAILY. 30 tablet 0   Rimegepant Sulfate (NURTEC) 75 MG TBDP Take 75 mg by mouth daily as needed. 8 tablet 5   rosuvastatin (CRESTOR) 10 MG tablet Take 1 tablet (10 mg total) by mouth daily. 90 tablet 3   triamterene-hydrochlorothiazide (MAXZIDE-25) 37.5-25 MG per tablet Take 1 tablet by mouth daily.     VENTOLIN HFA 108 (90 Base) MCG/ACT inhaler USE 1-2 PUFFS EVERY 6 HOURS AS NEEDED FOR WHEEZING OR SHORTNESS OF BREATH. 18 g 0   No current facility-administered medications for this visit.    Allergies-reviewed and updated Allergies  Allergen Reactions   Methylprednisolone     With IM injection- Panic attacks, trouble with concentration, hyperactive, hyperventilation   Prednisone Swelling    short course=swelling    Social History   Social History Narrative   Married 28 years   3 adopted children    Objective  Objective:  BP 122/82   Pulse (!) 55   Temp 97.9 F (36.6 C)   Ht 5' (1.524 m)   Wt 142 lb 3.2 oz (64.5 kg)   LMP 03/31/1977   SpO2 97%   BMI 27.77 kg/m  Gen: NAD, resting comfortably HEENT: Mucous  membranes are moist. Oropharynx normal Breasts: normal appearance, no masses or tenderness Neck: no thyromegaly CV: RRR no murmurs rubs or gallops Lungs: CTAB no crackles, wheeze, rhonchi Abdomen: soft/nontender/nondistended/normal bowel sounds. No rebound or guarding.  Ext: no edema Skin: warm, dry, 3 x 2 cm brownish raised lesion- appears to be seborrheic keratosis- referring to dermatology and can get their opinion Neuro: grossly normal, moves all extremities, PERRLA   Assessment and Plan   76 y.o. female presenting for annual physical.  Health Maintenance counseling: 1. Anticipatory guidance: Patient counseled regarding regular dental exams -q3 months-with regular dentist, eye exams t- usually yearly- some dry eyes- otc tears,  avoiding smoking and second hand smoke , limiting alcohol to 1 beverage per day-well under , no illicit drugs .   2. Risk factor reduction:  Advised patient of need for regular exercise and diet rich and fruits and vegetables to reduce risk of heart attack and stroke.  Exercise-encouraged 150 minutes/week- gets free membership to gym but does not want to go. Has treadmill at home- some activity with yardwork in better seasons.  Diet/weight management-weight up 6 pounds from last physical-at her age typically do not recommend aggressive weight loss but healthy eating/regular exercise encouraged and if has mild weight loss that is reasonable but primary endpoint should not be weight loss but instead healthy habits.  Wt Readings from Last 3 Encounters:  03/20/22 142 lb 3.2 oz (64.5 kg)  05/21/21 139 lb (63 kg)  03/19/21 136 lb 6.4 oz (61.9 kg)  3. Immunizations/screenings/ancillary studies-Tdap recommended at pharmacy, [place] [facility] in epic-had in  October  Immunization History  Administered Date(s) Administered   COVID-19, mRNA, vaccine(Comirnaty)12 years and older 01/15/2022   DTaP 12/25/2009   Fluad Quad(high Dose 65+) 12/21/2018, 02/14/2020, 01/08/2021, 01/15/2022   Influenza Split 01/17/2011   Influenza, High Dose Seasonal PF 02/26/2015, 04/24/2016, 01/20/2017, 03/10/2018   Influenza,inj,Quad PF,6+ Mos 01/07/2013, 01/13/2014   PFIZER Comirnaty(Gray Top)Covid-19 Tri-Sucrose Vaccine 01/15/2022   PFIZER(Purple Top)SARS-COV-2 Vaccination 05/06/2019, 05/27/2019, 12/06/2019   Pfizer Covid-19 Vaccine Bivalent Booster 43yr & up 01/28/2021   Pneumococcal Conjugate-13 04/19/2013   Pneumococcal Polysaccharide-23 05/02/2015   Tdap 03/31/2008   Zoster Recombinat (Shingrix) 10/03/2020, 03/01/2021   4. Cervical cancer screening- history of hysterectomy and never had abnormal Pap 5. Breast cancer screening-   mammogram 10/16/21- yearly diagnostic 6. Colon cancer screening - 11/03/2012 with plan to repeat in next year 7. Skin cancer screening-no dermatologist but agrees for general referrral advised regular sunscreen use. Denies worrisome, changing, or new skin lesions.  8. Birth control/STD check-hysterectomy and only active with husband 949 Osteoporosis screening at 67-low bone density last year-repeat in 3 years. Encouraged vitamin D and weight bearing exercise and calcium through diet 10. Smoking associated screening -former smoker-under a pack a day for 45 years but quit over 31 years ago-will check urinalysis  Status of chronic or acute concerns   #social update- enjoying pottery still!   #Vasculopathy #Hypercoagulable state ([facility] #Claudication- [facility] cardiology S: -On pletal/cilostazol '100mg'$  twice a day to help with claudication- doing well -Known hypercoagulable state as she is heterozygous for prothrombin mutation but no history of arterial or venous thrombosis.  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   -She is on vasodilators amlodipine  5 mg BID and Imdur '60mg'$  to try to help  -trying to avoid nsaids  A/P: overall stable- continue current medications  - estrogen through cardiology they are ok with risk  #Migraines- due  to vascular concerns avoids nsaids and triptans. Trial nurtec 02/2020- still helpful #Chronic bilateral low back pain with right-sided sciatica- on and off issues S: Patient uses very sparing hydrocodone for migraines her flareups of back pain. -Typically #30 hydrocodone last at least 4-6 months. -triptans contraindicated A/P: migraines stable overall- nurtec helpful and needing hydrocodone less (also can help with back pain) -may ask for 16 of nurtec refill next fill- will call if needed- maybe 24 per year.  -due for yearly UDS - PDMP reviewed - only getting rx through [place] -also on on alprazolam but knows to space by at least 8 hours from hydrocodone controlled substance contract on file from 2018   #hyperlipidemia/eleated LFTs in the past S: Medication:Rosuvastatin 10 mg every other day.  LDL goal at least under 100 but likely would prefer under 70 with microvascular disease  Transaminitis-thought potentially related to statin use in the past.  Normal 2020 Lab Results  Component Value Date   CHOL 167 03/19/2021   HDL 90.20 03/19/2021   LDLCALC 66 03/19/2021   LDLDIRECT 58.0 09/18/2020   TRIG 54.0 03/19/2021   CHOLHDL 2 03/19/2021   A/P: lipids at goal on last check- update today   #hypertension S: medication: Amlodipine 5 mg twice daily, Imdur 60 mg daily, triamterine hydrochlorothiazide 37.5-25 mg.  Also takes potassium A/P: stable- continue current medicines    #Chronic kidney disease stage III S: GFR is typically in the 50s range-has dipped into the 40s with poor hydration in the past -Patient knows to avoid NSAIDs  A/P: hopefully stable- update GFR today. Continue current meds for now   # Adjustment disorder with  anxiety S: Medication: Prozac 40 mg .  Sparing alprazolam a few times a month for anxiety element A/P: stable- continue current medicines    #Elevated TSH-slightly elevated TSH in the past.  - has had intolerance worsening. No loose stools. No fevers.  -pretty much all the time  #Reactive airways-albuterol as needed- has needed some more this year once a month  -Issues when gets exposed to dust/pollen at times -some morning cough and more stuffiness- bad allergy season. Albuterol may calm cough.  -does not like nasal sprays- advised trial claritin/allegra/ or xyzal for 2 weeks to see if makes difference  #Macrocytosis-patient reports many years of issues.  B12 and folate have been normal in the past but high methylmalonic acid so she is taking B12 . Chronic issues- may be her baseline  # Alpha gal- hives starting 2021 after meat intake potentially. testing alpha gal. seeing allergist. Epi pen as had tongue swelling as well and gave dose for prednisone -no attacks since spring- cross contamination  #arthritis in hands as well  Recommended follow up: Return in about 1 year (around 03/21/2023) for physical or sooner if needed.Schedule b4 you leave.  Lab/Order associations: fasting   ICD-10-CM   1. Preventative health care  Z00.00     2. Hypercoagulable state ([place])  D68.59     3. Allergy to alpha-gal  Z91.018 Alpha-Gal Panel    4. Intermittent claudication (HCC)  I73.9     5. Hyperlipidemia, unspecified hyperlipidemia type  E78.5 CBC with Differential/Platelet    Comprehensive metabolic panel    Lipid panel    6. Essential hypertension  I10 CBC with Differential/Platelet    Comprehensive metabolic panel    Lipid panel    Urinalysis, Routine w reflex microscopic    7. Elevated TSH  R79.89 TSH    8. High risk medication use  Z79.899 DRUG MONITORING, PANEL 8 WITH CONFIRMATION, URINE    9. Screening exam for skin cancer  Z12.83 Ambulatory referral to Dermatology     Meds  ordered this encounter  Medications   HYDROcodone-acetaminophen (NORCO) 10-325 MG tablet    Sig: TAKE (1) TABLET EVERY FOUR TO SIX HOURS AS NEEDED. Do not drive for 6 hours after use.    Dispense:  30 tablet    Refill:  0   ALPRAZolam (XANAX) 0.5 MG tablet    Sig: TAKE 1 TABLET EVERY 8 HOURS AS NEEDED FOR ANXIETY. Do not drive for 8 hours after use.    Dispense:  30 tablet    Refill:  0   Return precautions advised.  Garret Reddish, MD

## 2022-03-21 ENCOUNTER — Encounter: Payer: Self-pay | Admitting: Family Medicine

## 2022-03-26 ENCOUNTER — Other Ambulatory Visit (HOSPITAL_BASED_OUTPATIENT_CLINIC_OR_DEPARTMENT_OTHER): Payer: Self-pay

## 2022-03-26 LAB — DRUG MONITORING, PANEL 8 WITH CONFIRMATION, URINE
6 Acetylmorphine: NEGATIVE ng/mL (ref <10)
Alcohol Metabolites: POSITIVE ng/mL — AB (ref <500)
Amphetamines: NEGATIVE ng/mL (ref <500)
Benzodiazepines: NEGATIVE ng/mL (ref <100)
Buprenorphine, Urine: NEGATIVE ng/mL (ref <5)
Cocaine Metabolite: NEGATIVE ng/mL (ref <150)
Codeine: NEGATIVE ng/mL (ref <50)
Creatinine: 49.8 mg/dL (ref >=20.0)
Ethyl Glucuronide (ETG): 791 ng/mL — ABNORMAL HIGH (ref <500)
Ethyl Sulfate (ETS): 376 ng/mL — ABNORMAL HIGH (ref <100)
Hydrocodone: NEGATIVE ng/mL (ref <50)
Hydromorphone: NEGATIVE ng/mL (ref <50)
MDMA: NEGATIVE ng/mL (ref <500)
Marijuana Metabolite: NEGATIVE ng/mL (ref <20)
Morphine: NEGATIVE ng/mL (ref <50)
Norhydrocodone: NEGATIVE ng/mL (ref <50)
Opiates: NEGATIVE ng/mL (ref <100)
Oxidant: NEGATIVE ug/mL (ref <200)
Oxycodone: NEGATIVE ng/mL (ref <100)
pH: 7.1 (ref 4.5–9.0)

## 2022-03-26 LAB — ALPHA-GAL PANEL
Allergen, Mutton, f88: <0.1 kU/L
Allergen, Pork, f26: <0.1 kU/L
Beef: 0.13 kU/L — ABNORMAL HIGH
CLASS: 0
Class: 0
GALACTOSE-ALPHA-1,3-GALACTOSE IGE*: 0.56 kU/L — ABNORMAL HIGH (ref <0.10)

## 2022-03-26 LAB — DM TEMPLATE

## 2022-03-26 LAB — INTERPRETATION:

## 2022-03-27 ENCOUNTER — Other Ambulatory Visit (HOSPITAL_BASED_OUTPATIENT_CLINIC_OR_DEPARTMENT_OTHER): Payer: Self-pay

## 2022-03-28 ENCOUNTER — Other Ambulatory Visit: Payer: Self-pay

## 2022-03-28 MED ORDER — ROSUVASTATIN CALCIUM 10 MG PO TABS: 10.0000 mg | ORAL_TABLET | Freq: Every day | ORAL | 3 refills | Status: DC

## 2022-04-07 DIAGNOSIS — K080 Exfoliation of teeth due to systemic causes: Secondary | ICD-10-CM | POA: Diagnosis not present

## 2022-05-08 ENCOUNTER — Telehealth: Payer: Self-pay | Admitting: Family Medicine

## 2022-05-08 NOTE — Telephone Encounter (Signed)
Copied from [place] [id number]. Topic: Referral - Question >> May 08, 2022 10:32 AM Gillis Santa wrote: Reason for CRM: PATIENT HAS BEEN TRYING TO REACH THE [facility] TO SCHEDULE APPT BUT HAS NOT BEEN ABLE REACH ANYONE THERE AND NO RETURN PHONE CALL WHEN SHE LEAVES HER NUMBER.. SHE WOULD LIKE SOMEONE TO GIVE HER A CALL BACK TO TRY TO HELP HER GET IT SCHEDULED. [place]

## 2022-05-26 ENCOUNTER — Encounter: Payer: Self-pay | Admitting: Family Medicine

## 2022-05-27 ENCOUNTER — Ambulatory Visit (INDEPENDENT_AMBULATORY_CARE_PROVIDER_SITE_OTHER)
Admission: RE | Admit: 2022-05-27 | Discharge: 2022-05-27 | Disposition: A | Discharge status: Home / Self Care | Payer: Medicare Other | Source: Ambulatory Visit | Attending: Family Medicine | Admitting: Family Medicine

## 2022-05-27 ENCOUNTER — Other Ambulatory Visit: Payer: Self-pay

## 2022-05-27 DIAGNOSIS — R059 Cough, unspecified: Secondary | ICD-10-CM

## 2022-05-28 ENCOUNTER — Encounter: Payer: Self-pay | Admitting: Family Medicine

## 2022-05-28 ENCOUNTER — Other Ambulatory Visit (HOSPITAL_BASED_OUTPATIENT_CLINIC_OR_DEPARTMENT_OTHER): Payer: Self-pay

## 2022-05-28 ENCOUNTER — Ambulatory Visit (INDEPENDENT_AMBULATORY_CARE_PROVIDER_SITE_OTHER): Payer: Medicare Other | Admitting: Family Medicine

## 2022-05-28 VITALS — BP 130/64 | HR 73 | Temp 98.3°F | Ht 60.0 in | Wt 145.4 lb

## 2022-05-28 DIAGNOSIS — R053 Chronic cough: Secondary | ICD-10-CM

## 2022-05-28 DIAGNOSIS — R9389 Abnormal findings on diagnostic imaging of other specified body structures: Secondary | ICD-10-CM

## 2022-05-28 DIAGNOSIS — J209 Acute bronchitis, unspecified: Secondary | ICD-10-CM | POA: Diagnosis not present

## 2022-05-28 LAB — POC COVID19 BINAXNOW: SARS Coronavirus 2 Ag: NEGATIVE

## 2022-05-28 MED ORDER — GUAIFENESIN-CODEINE 100-10 MG/5ML PO SOLN: 5.0000 mL | Freq: Four times a day (QID) | ORAL | 0 refills | Status: DC | PRN

## 2022-05-28 MED ORDER — GUAIFENESIN-CODEINE 100-10 MG/5ML PO SOLN
5.0000 mL | Freq: Four times a day (QID) | ORAL | 0 refills | Status: DC | PRN
  Filled 2022-05-28: qty 120, 6d supply, fill #0

## 2022-05-28 MED ORDER — AZITHROMYCIN 250 MG PO TABS: ORAL_TABLET | ORAL | 0 refills | Status: DC

## 2022-05-28 MED ORDER — AZITHROMYCIN 250 MG PO TABS
ORAL_TABLET | ORAL | 0 refills | Status: DC
  Filled 2022-05-28: qty 6, 5d supply, fill #0

## 2022-05-28 NOTE — Patient Instructions (Signed)
Please return in 48 hours if not improving.   I will let you know about your chest xray and if further imaging studies are needed.   If you have any questions or concerns, please don't hesitate to send me a message via MyChart or call the office at [phone]. Thank you for visiting with [place] today! It's our pleasure caring for you.

## 2022-05-28 NOTE — Progress Notes (Signed)
Subjective  CC:  Chief Complaint  Patient presents with   Cough    Pt stated that she has had a cough for several years but it has gotten worse    Same day acute visit; PCP not available. New pt to me. Chart reviewed.   HPI: Kari Welch is a 77 y.o. female who presents to the office today to address the problems listed above in the chief complaint. 77 yo female with multiple medical problems w/ terrible coughing spasms. Has long h/o cough (at least a year now). Thought to be related to environmental allergens. At time uses inhaler. Former smoker (quit 1990s), no h/o asthma or copd. But over last week, worsening cough, some sore throat, worsening PND and temperature is higher than her normal with some chills. Coughing non stop. Using flonase and oral antihistamine for a few days w/o relief. No pleuritic cp or sob. No calf pain or swelling. Due to worsening cough, cxr was done yesterday. I reviewed cxr: left lower lobe wedge shape defect. Compared to AP view from 2010, is chronic finding. At that time it was read as lingular atx. Radiology review not yet available.  Pt has h/o side effects from steroids.  Ros: decreased appetite. Limited activity due to coughing.   Assessment  1. Acute bronchitis, unspecified organism   2. Chronic cough      Plan  Cough, productive w/ abnl cxr:  differential includes CAP and bronchitis, PE, other. Discussed findings with patient and her husband. Elect to cover for CAP with zpak, await final read, add robitussin AC, avoid steroids for now and monitor respiratory status. Further imaging if needed after rads overread. Close f/u in 48 hours if not improving. Patient understands and agrees with care plan.   I spent a total of 42 minutes for this patient encounter. Time spent included preparation, face-to-face counseling with the patient and coordination of care, review of chart and records, and documentation of the encounter.   Follow up: 48 hours if not  improving.  Visit date not found  Orders Placed This Encounter  Procedures   POC COVID-19   Meds ordered this encounter  Medications   DISCONTD: azithromycin (ZITHROMAX) 250 MG tablet    Sig: Take 2 tabs today, then 1 tab daily for 4 days    Dispense:  1 each    Refill:  0   DISCONTD: guaiFENesin-codeine 100-10 MG/5ML syrup    Sig: Take 5 mLs by mouth every 6 (six) hours as needed for cough.    Dispense:  120 mL    Refill:  0   guaiFENesin-codeine 100-10 MG/5ML syrup    Sig: Take 5 mLs by mouth every 6 (six) hours as needed for cough.    Dispense:  120 mL    Refill:  0   azithromycin (ZITHROMAX) 250 MG tablet    Sig: Take 2 tabs today, then 1 tab daily for 4 days    Dispense:  6 tablet    Refill:  0      I reviewed the patients updated PMH, FH, and SocHx.    Patient Active Problem List   Diagnosis Date Noted   Osteopenia of left femoral neck 06/20/2021   Allergy to alpha-gal 05/21/2021   Urticaria 03/15/2020   Migraine    Elevated TSH 03/10/2018   CKD (chronic kidney disease), stage III ([place]) 03/04/2017   Cerumen impaction 03/04/2017   Arthritis 11/15/2015   Transaminitis 05/02/2015   Varicose veins of right lower extremity  with pain    Adjustment disorder with anxiety 11/29/2013   Chronic back pain 11/29/2013   Macrocytosis without anemia 11/29/2013   Intermittent claudication ([place]) 05/26/2012   Hypercoagulable state ([place]) 05/26/2012   Paroxysmal digital cyanosis 05/26/2012   Hyperlipidemia 02/04/2010   Vasculopathy 11/29/2007   Essential hypertension 11/19/2006   Current Meds  Medication Sig   albuterol (PROAIR HFA) 108 (90 Base) MCG/ACT inhaler Inhale 1-2 puffs into the lungs every 6 (six) hours as needed for wheezing or shortness of breath.   ALPRAZolam (XANAX) 0.5 MG tablet TAKE 1 TABLET EVERY 8 HOURS AS NEEDED FOR ANXIETY. Do not drive for 8 hours after use.   amLODipine (NORVASC) 5 MG tablet Take 1 tablet by mouth 2 (two) times daily.    aspirin 81 MG  chewable tablet Chew 81 mg by mouth daily.   cilostazol (PLETAL) 100 MG tablet Take 100 mg by mouth 2 (two) times daily.   cyanocobalamin 1000 MCG tablet Take 1,000 mcg by mouth daily.   EPINEPHrine 0.3 mg/0.3 mL IJ SOAJ injection Inject 0.3 mg into the muscle as needed for anaphylaxis.   estradiol (CLIMARA - DOSED IN MG/24 HR) 0.0375 mg/24hr patch Place 1 patch (0.0375 mg total) onto the skin once a week.   FLUoxetine (PROZAC) 40 MG capsule TAKE (1) CAPSULE DAILY.   HYDROcodone-acetaminophen (NORCO) 10-325 MG tablet TAKE (1) TABLET EVERY FOUR TO SIX HOURS AS NEEDED. Do not drive for 6 hours after use.   isosorbide mononitrate (IMDUR) 60 MG 24 hr tablet Take 60 mg by mouth daily.   ondansetron (ZOFRAN-ODT) 4 MG disintegrating tablet Take 1 tablet (4 mg total) by mouth every 8 (eight) hours as needed for nausea or vomiting.   potassium chloride SA (K-DUR,KLOR-CON) 20 MEQ tablet TAKE 1 TABLET DAILY.   Rimegepant Sulfate (NURTEC) 75 MG TBDP Take 75 mg by mouth daily as needed.   rosuvastatin (CRESTOR) 10 MG tablet Take 1 tablet (10 mg total) by mouth daily.   triamterene-hydrochlorothiazide (MAXZIDE-25) 37.5-25 MG per tablet Take 1 tablet by mouth daily.   VENTOLIN HFA 108 (90 Base) MCG/ACT inhaler USE 1-2 PUFFS EVERY 6 HOURS AS NEEDED FOR WHEEZING OR SHORTNESS OF BREATH.   [DISCONTINUED] azithromycin (ZITHROMAX) 250 MG tablet Take 2 tabs today, then 1 tab daily for 4 days   [DISCONTINUED] guaiFENesin-codeine 100-10 MG/5ML syrup Take 5 mLs by mouth every 6 (six) hours as needed for cough.    Allergies: Patient is allergic to methylprednisolone and prednisone. Family History: Patient family history includes Colon cancer (age of onset: 67) in her mother; Diabetes in her maternal grandmother; Gallbladder disease (age of onset: 45) in her father; Heart attack in her father and mother; Migraines in her mother; Rheum arthritis in her brother; Stroke in her father and mother. Social History:  Patient   reports that she quit smoking about 33 years ago. Her smoking use included cigarettes. She has a 9.00 pack-year smoking history. She has never used smokeless tobacco. She reports current alcohol use of about 2.0 standard drinks of alcohol per week. She reports that she does not use drugs.  Review of Systems: Constitutional: Negative for fever malaise or anorexia Cardiovascular: negative for chest pain Respiratory: negative for SOB or persistent cough Gastrointestinal: negative for abdominal pain  Objective  Vitals: BP 130/64   Pulse 73   Temp 98.3 F (36.8 C)   Ht 5' (1.524 m)   Wt 145 lb 6.4 oz (66 kg)   LMP 03/31/1977   SpO2 95%  BMI 28.40 kg/m  General: constant coughing while in office, no respiratory distress. Able to speak in full sentences, A&Ox3 HEENT: PEERL, conjunctiva normal, neck is supple, no cervical LAD Cardiovascular:  RRR without murmur or gallop.  Respiratory:  Good breath sounds bilaterally, left base with rhonchi and some wheeze. No clear rales. Good air mvt.  Skin:  Warm, no rashes  Commons side effects, risks, benefits, and alternatives for medications and treatment plan prescribed today were discussed, and the patient expressed understanding of the given instructions. Patient is instructed to call or message via MyChart if he/she has any questions or concerns regarding our treatment plan. No barriers to understanding were identified. We discussed Red Flag symptoms and signs in detail. Patient expressed understanding regarding what to do in case of urgent or emergency type symptoms.  Medication list was reconciled, printed and provided to the patient in AVS. Patient instructions and summary information was reviewed with the patient as documented in the AVS. This note was prepared with assistance of Dragon voice recognition software. Occasional wrong-word or sound-a-like substitutions may have occurred due to the inherent limitations of voice recognition  software

## 2022-06-16 ENCOUNTER — Other Ambulatory Visit (HOSPITAL_BASED_OUTPATIENT_CLINIC_OR_DEPARTMENT_OTHER): Payer: Self-pay

## 2022-06-16 DIAGNOSIS — I10 Essential (primary) hypertension: Secondary | ICD-10-CM | POA: Diagnosis not present

## 2022-06-16 DIAGNOSIS — I739 Peripheral vascular disease, unspecified: Secondary | ICD-10-CM | POA: Diagnosis not present

## 2022-06-16 DIAGNOSIS — I7300 Raynaud's syndrome without gangrene: Secondary | ICD-10-CM | POA: Diagnosis not present

## 2022-06-16 DIAGNOSIS — I872 Venous insufficiency (chronic) (peripheral): Secondary | ICD-10-CM | POA: Diagnosis not present

## 2022-06-16 MED ORDER — ISOSORBIDE MONONITRATE ER 60 MG PO TB24
60.0000 mg | ORAL_TABLET | Freq: Every day | ORAL | 3 refills | Status: DC
  Filled 2022-06-16: qty 90, 90d supply, fill #0
  Filled 2022-12-25: qty 90, 90d supply, fill #1
  Filled 2023-03-17: qty 90, 90d supply, fill #2

## 2022-06-16 MED ORDER — AMLODIPINE BESYLATE 5 MG PO TABS
5.0000 mg | ORAL_TABLET | Freq: Two times a day (BID) | ORAL | 3 refills | Status: DC
  Filled 2022-06-16: qty 180, 90d supply, fill #0
  Filled 2022-12-25: qty 180, 90d supply, fill #1
  Filled 2023-03-17: qty 180, 90d supply, fill #2

## 2022-06-16 MED ORDER — CILOSTAZOL 100 MG PO TABS
100.0000 mg | ORAL_TABLET | Freq: Two times a day (BID) | ORAL | 3 refills | Status: DC
  Filled 2022-06-16: qty 180, 90d supply, fill #0
  Filled 2022-12-25: qty 180, 90d supply, fill #1
  Filled 2023-03-17: qty 180, 90d supply, fill #2

## 2022-06-16 MED ORDER — POTASSIUM CHLORIDE CRYS ER 20 MEQ PO TBCR
20.0000 meq | EXTENDED_RELEASE_TABLET | Freq: Every day | ORAL | 3 refills | Status: DC
  Filled 2022-06-16: qty 90, 90d supply, fill #0
  Filled 2022-10-10: qty 90, 90d supply, fill #1
  Filled 2023-03-19: qty 90, 90d supply, fill #2

## 2022-06-16 MED ORDER — TRIAMTERENE-HCTZ 37.5-25 MG PO TABS
1.0000 | ORAL_TABLET | Freq: Every day | ORAL | 3 refills | Status: DC
  Filled 2022-06-16: qty 90, 90d supply, fill #0
  Filled 2022-10-10: qty 90, 90d supply, fill #1
  Filled 2023-03-17: qty 90, 90d supply, fill #2

## 2022-06-16 MED ORDER — FLUOXETINE HCL 40 MG PO CAPS
40.0000 mg | ORAL_CAPSULE | Freq: Every day | ORAL | 3 refills | Status: DC
  Filled 2022-06-16: qty 90, 90d supply, fill #0
  Filled 2022-12-25: qty 90, 90d supply, fill #1
  Filled 2023-03-17: qty 90, 90d supply, fill #2

## 2022-06-16 MED ORDER — ESTRADIOL 0.0375 MG/24HR TD PTWK
0.0375 mg | MEDICATED_PATCH | TRANSDERMAL | 3 refills | Status: AC
  Filled 2022-06-16: qty 12, 84d supply, fill #0
  Filled 2022-10-10: qty 12, 84d supply, fill #1
  Filled 2023-01-12: qty 12, 84d supply, fill #2
  Filled 2023-03-17 – 2023-03-18 (×2): qty 12, 84d supply, fill #3

## 2022-06-16 MED ORDER — ROSUVASTATIN CALCIUM 10 MG PO TABS
10.0000 mg | ORAL_TABLET | Freq: Every day | ORAL | 3 refills | Status: DC
  Filled 2022-06-16: qty 90, 90d supply, fill #0
  Filled 2022-10-10: qty 90, 90d supply, fill #1
  Filled 2023-02-05: qty 90, 90d supply, fill #2

## 2022-06-17 ENCOUNTER — Other Ambulatory Visit (HOSPITAL_BASED_OUTPATIENT_CLINIC_OR_DEPARTMENT_OTHER): Payer: Self-pay

## 2022-06-20 ENCOUNTER — Other Ambulatory Visit (HOSPITAL_BASED_OUTPATIENT_CLINIC_OR_DEPARTMENT_OTHER): Payer: Self-pay

## 2022-07-04 ENCOUNTER — Encounter: Payer: Self-pay | Admitting: Family Medicine

## 2022-07-04 DIAGNOSIS — R053 Chronic cough: Secondary | ICD-10-CM

## 2022-07-07 ENCOUNTER — Encounter: Payer: Self-pay | Admitting: Family Medicine

## 2022-07-08 ENCOUNTER — Ambulatory Visit
Admission: RE | Admit: 2022-07-08 | Discharge: 2022-07-08 | Disposition: A | Discharge status: Home / Self Care | Payer: Medicare Other | Source: Ambulatory Visit | Attending: Family Medicine | Admitting: Family Medicine

## 2022-07-08 ENCOUNTER — Encounter: Payer: Self-pay | Admitting: Family Medicine

## 2022-07-08 ENCOUNTER — Telehealth: Payer: Self-pay | Admitting: Family Medicine

## 2022-07-08 DIAGNOSIS — R911 Solitary pulmonary nodule: Secondary | ICD-10-CM | POA: Diagnosis not present

## 2022-07-08 DIAGNOSIS — J439 Emphysema, unspecified: Secondary | ICD-10-CM | POA: Diagnosis not present

## 2022-07-08 DIAGNOSIS — R053 Chronic cough: Secondary | ICD-10-CM

## 2022-07-08 DIAGNOSIS — R918 Other nonspecific abnormal finding of lung field: Secondary | ICD-10-CM | POA: Diagnosis not present

## 2022-07-08 DIAGNOSIS — J9811 Atelectasis: Secondary | ICD-10-CM | POA: Diagnosis not present

## 2022-07-08 NOTE — Telephone Encounter (Signed)
[facility] would like a call back about pts CT referral. [phone]

## 2022-07-08 NOTE — Telephone Encounter (Signed)
I have sent this to Misty Stanley to have her look into it.

## 2022-07-09 ENCOUNTER — Other Ambulatory Visit: Payer: Self-pay | Admitting: Family Medicine

## 2022-07-09 ENCOUNTER — Other Ambulatory Visit (HOSPITAL_BASED_OUTPATIENT_CLINIC_OR_DEPARTMENT_OTHER): Payer: Self-pay

## 2022-07-09 MED ORDER — AMOXICILLIN-POT CLAVULANATE 875-125 MG PO TABS: 1.0000 | ORAL_TABLET | Freq: Two times a day (BID) | ORAL | 0 refills | Status: DC

## 2022-07-09 MED ORDER — AMOXICILLIN-POT CLAVULANATE 875-125 MG PO TABS
1.0000 | ORAL_TABLET | Freq: Two times a day (BID) | ORAL | 0 refills | Status: DC
  Filled 2022-07-09: qty 14, 7d supply, fill #0

## 2022-07-09 MED ORDER — GUAIFENESIN-CODEINE 100-10 MG/5ML PO SOLN
5.0000 mL | Freq: Four times a day (QID) | ORAL | 0 refills | Status: DC | PRN
  Filled 2022-07-09: qty 120, 6d supply, fill #0

## 2022-07-09 NOTE — Addendum Note (Signed)
Addended by: Shelva Majestic on: 07/09/2022 12:55 PM   Modules accepted: Orders

## 2022-07-09 NOTE — Addendum Note (Signed)
Addended by: Shelva Majestic on: 07/09/2022 01:40 PM   Modules accepted: Orders

## 2022-07-11 ENCOUNTER — Other Ambulatory Visit: Payer: Medicare Other

## 2022-07-16 ENCOUNTER — Ambulatory Visit (INDEPENDENT_AMBULATORY_CARE_PROVIDER_SITE_OTHER): Payer: Medicare Other | Admitting: Family Medicine

## 2022-07-16 ENCOUNTER — Other Ambulatory Visit (HOSPITAL_BASED_OUTPATIENT_CLINIC_OR_DEPARTMENT_OTHER): Payer: Self-pay

## 2022-07-16 ENCOUNTER — Encounter: Payer: Self-pay | Admitting: Family Medicine

## 2022-07-16 VITALS — BP 110/82 | HR 68 | Temp 97.8°F | Ht 60.0 in | Wt 147.2 lb

## 2022-07-16 DIAGNOSIS — R635 Abnormal weight gain: Secondary | ICD-10-CM | POA: Diagnosis not present

## 2022-07-16 DIAGNOSIS — R053 Chronic cough: Secondary | ICD-10-CM

## 2022-07-16 DIAGNOSIS — J3489 Other specified disorders of nose and nasal sinuses: Secondary | ICD-10-CM | POA: Diagnosis not present

## 2022-07-16 DIAGNOSIS — R430 Anosmia: Secondary | ICD-10-CM | POA: Diagnosis not present

## 2022-07-16 DIAGNOSIS — R059 Cough, unspecified: Secondary | ICD-10-CM

## 2022-07-16 LAB — COMPREHENSIVE METABOLIC PANEL
ALT: 12 U/L (ref 0–35)
AST: 19 U/L (ref 0–37)
Albumin: 4 g/dL (ref 3.5–5.2)
Alkaline Phosphatase: 43 U/L (ref 39–117)
BUN: 22 mg/dL (ref 6–23)
CO2: 24 mEq/L (ref 19–32)
Calcium: 9.3 mg/dL (ref 8.4–10.5)
Chloride: 104 mEq/L (ref 96–112)
Creatinine, Ser: 1.18 mg/dL (ref 0.40–1.20)
GFR: 45.51 mL/min — ABNORMAL LOW (ref >60.00)
Glucose, Bld: 96 mg/dL (ref 70–99)
Potassium: 3.7 mEq/L (ref 3.5–5.1)
Sodium: 140 mEq/L (ref 135–145)
Total Bilirubin: 0.3 mg/dL (ref 0.2–1.2)
Total Protein: 7 g/dL (ref 6.0–8.3)

## 2022-07-16 LAB — CBC WITH DIFFERENTIAL/PLATELET
Basophils Absolute: 0 10*3/uL (ref 0.0–0.1)
Basophils Relative: 0.8 % (ref 0.0–3.0)
Eosinophils Absolute: 0.2 10*3/uL (ref 0.0–0.7)
Eosinophils Relative: 4.3 % (ref 0.0–5.0)
HCT: 41.7 % (ref 36.0–46.0)
Hemoglobin: 14.1 g/dL (ref 12.0–15.0)
Lymphocytes Relative: 39.9 % (ref 12.0–46.0)
Lymphs Abs: 2.1 10*3/uL (ref 0.7–4.0)
MCHC: 33.9 g/dL (ref 30.0–36.0)
MCV: 103.2 fl — ABNORMAL HIGH (ref 78.0–100.0)
Monocytes Absolute: 0.5 10*3/uL (ref 0.1–1.0)
Monocytes Relative: 9.7 % (ref 3.0–12.0)
Neutro Abs: 2.4 10*3/uL (ref 1.4–7.7)
Neutrophils Relative %: 45.3 % (ref 43.0–77.0)
Platelets: 354 10*3/uL (ref 150.0–400.0)
RBC: 4.04 Mil/uL (ref 3.87–5.11)
RDW: 14.1 % (ref 11.5–15.5)
WBC: 5.3 10*3/uL (ref 4.0–10.5)

## 2022-07-16 LAB — POC COVID19 BINAXNOW: SARS Coronavirus 2 Ag: NEGATIVE

## 2022-07-16 LAB — BRAIN NATRIURETIC PEPTIDE: Pro B Natriuretic peptide (BNP): 34 pg/mL (ref 0.0–100.0)

## 2022-07-16 LAB — TSH: TSH: 6.44 u[IU]/mL — ABNORMAL HIGH (ref 0.35–5.50)

## 2022-07-16 MED ORDER — BENZONATATE 100 MG PO CAPS
100.0000 mg | ORAL_CAPSULE | Freq: Three times a day (TID) | ORAL | 0 refills | Status: DC | PRN
  Filled 2022-07-16: qty 30, 10d supply, fill #0

## 2022-07-16 MED ORDER — DEXAMETHASONE 4 MG PO TABS
ORAL_TABLET | ORAL | 0 refills | Status: DC
  Filled 2022-07-16: qty 11, 5d supply, fill #0

## 2022-07-16 MED ORDER — AZELASTINE HCL 0.1 % NA SOLN
2.0000 | Freq: Two times a day (BID) | NASAL | 12 refills | Status: DC
  Filled 2022-07-16: qty 30, 50d supply, fill #0

## 2022-07-16 MED ORDER — BUDESONIDE-FORMOTEROL FUMARATE 160-4.5 MCG/ACT IN AERO
2.0000 | INHALATION_SPRAY | Freq: Two times a day (BID) | RESPIRATORY_TRACT | 3 refills | Status: DC
  Filled 2022-07-16: qty 10.2, 30d supply, fill #0

## 2022-07-16 NOTE — Patient Instructions (Addendum)
CT maxillofacial through [facility].  Their phone number is [phone].  Please call them if you have not heard in 2 days.   Dexamethasone trial IF no improvement on Symbicort in 3-4 days  Keep pulmonology visit  Continue astelin and allegra -could trial plain mucinex or mucinex- diabetes  -tessalon for cough  Please stop by lab before you go If you have mychart- we will send your results within 3 business days of [place] receiving them.  If you do not have mychart- we will call you about results within 5 business days of [place] receiving them.  *please also note that you will see labs on mychart as soon as they post. I will later go in and write notes on them- will say "notes from Dr. Durene Cal"

## 2022-07-16 NOTE — Progress Notes (Signed)
Phone [phone] In person visit   Subjective:   Kari Welch is a 77 y.o. year old very pleasant female patient who presents for/with See problem oriented charting Chief Complaint  Patient presents with   sinus issues    Pt c/o chest congestion with dry cough, pt states this has been going on since the end of feb. She got better and then got worse a few weeks ago.    Past Medical History-  Patient Active Problem List   Diagnosis Date Noted   Allergy to alpha-gal 05/21/2021    Priority: High   Urticaria 03/15/2020    Priority: High   Chronic back pain 11/29/2013    Priority: High   Hypercoagulable state ([facility]) 05/26/2012    Priority: High   Paroxysmal digital cyanosis 05/26/2012    Priority: High   Vasculopathy 11/29/2007    Priority: High   Osteopenia of left femoral neck 06/20/2021    Priority: Medium    Elevated TSH 03/10/2018    Priority: Medium    CKD (chronic kidney disease), stage III 03/04/2017    Priority: Medium    Transaminitis 05/02/2015    Priority: Medium    Adjustment disorder with anxiety 11/29/2013    Priority: Medium    Intermittent claudication 05/26/2012    Priority: Medium    Hyperlipidemia 02/04/2010    Priority: Medium    Essential hypertension 11/19/2006    Priority: Medium    Cerumen impaction 03/04/2017    Priority: Low   Varicose veins of right lower extremity with pain     Priority: Low   Macrocytosis without anemia 11/29/2013    Priority: Low   Emphysema lung 07/08/2022   Migraine    Arthritis 11/15/2015    Medications- reviewed and updated Current Outpatient Medications  Medication Sig Dispense Refill   albuterol (PROAIR HFA) 108 (90 Base) MCG/ACT inhaler Inhale 1-2 puffs into the lungs every 6 (six) hours as needed for wheezing or shortness of breath. 18 g 0   ALPRAZolam (XANAX) 0.5 MG tablet TAKE 1 TABLET EVERY 8 HOURS AS NEEDED FOR ANXIETY. Do not drive for 8 hours after use. 30 tablet 0   amLODipine (NORVASC) 5 MG  tablet Take 1 tablet by mouth 2 (two) times daily.      amLODipine (NORVASC) 5 MG tablet Take 1 tablet (5 mg total) by mouth 2 (two) times daily. 180 tablet 3   aspirin 81 MG chewable tablet Chew 81 mg by mouth daily.     azelastine (ASTELIN) 0.1 % nasal spray Place 2 sprays into both nostrils 2 (two) times daily. 30 mL 12   benzonatate (TESSALON) 100 MG capsule Take 1 capsule (100 mg total) by mouth 3 (three) times daily as needed for cough. 30 capsule 0   budesonide-formoterol (SYMBICORT) 160-4.5 MCG/ACT inhaler Inhale 2 puffs into the lungs 2 (two) times daily. 10.2 g 3   cilostazol (PLETAL) 100 MG tablet Take 100 mg by mouth 2 (two) times daily.     cilostazol (PLETAL) 100 MG tablet Take 1 tablet (100 mg total) by mouth 2 (two) times daily 180 tablet 3   cyanocobalamin 1000 MCG tablet Take 1,000 mcg by mouth daily.     EPINEPHrine 0.3 mg/0.3 mL IJ SOAJ injection Inject 0.3 mg into the muscle as needed for anaphylaxis. 2 each 0   estradiol (CLIMARA - DOSED IN MG/24 HR) 0.0375 mg/24hr patch Place 1 patch (0.0375 mg total) onto the skin once a week. 12 patch 3   estradiol (  CLIMARA - DOSED IN MG/24 HR) 0.0375 mg/24hr patch Place 1 patch onto the skin once a week 12 patch 3   FLUoxetine (PROZAC) 40 MG capsule TAKE (1) CAPSULE DAILY. 90 capsule 0   FLUoxetine (PROZAC) 40 MG capsule Take 1 capsule (40 mg total) by mouth daily. 90 capsule 3   isosorbide mononitrate (IMDUR) 60 MG 24 hr tablet Take 1 tablet (60 mg total) by mouth daily. 90 tablet 3   ondansetron (ZOFRAN-ODT) 4 MG disintegrating tablet Take 1 tablet (4 mg total) by mouth every 8 (eight) hours as needed for nausea or vomiting. 20 tablet 0   potassium chloride SA (K-DUR,KLOR-CON) 20 MEQ tablet TAKE 1 TABLET DAILY. 30 tablet 0   potassium chloride SA (KLOR-CON M) 20 MEQ tablet Take 1 tablet (20 mEq total) by mouth daily. 90 tablet 3   Rimegepant Sulfate (NURTEC) 75 MG TBDP Take 75 mg by mouth daily as needed. 8 tablet 5   rosuvastatin  (CRESTOR) 10 MG tablet Take 1 tablet (10 mg total) by mouth once daily 90 tablet 3   triamterene-hydrochlorothiazide (MAXZIDE-25) 37.5-25 MG tablet Take 1 tablet by mouth once daily 90 tablet 3   VENTOLIN HFA 108 (90 Base) MCG/ACT inhaler USE 1-2 PUFFS EVERY 6 HOURS AS NEEDED FOR WHEEZING OR SHORTNESS OF BREATH. 18 g 0   dexamethasone (DECADRON) 4 MG tablet Take 1 tablet by mouth three times daily for 2 days, then 1 tablet twice a day for 2 days, then 1 tablet once a day for 1 day, then stop 11 tablet 0   guaiFENesin-codeine 100-10 MG/5ML syrup Take 5 mLs by mouth every 6 (six) hours as needed for cough (do not drive or take hydrocodone within 6 hours of use). (Patient not taking: Reported on 07/16/2022) 120 mL 0   HYDROcodone-acetaminophen (NORCO) 10-325 MG tablet TAKE (1) TABLET EVERY FOUR TO SIX HOURS AS NEEDED. Do not drive for 6 hours after use. (Patient not taking: Reported on 07/16/2022) 30 tablet 0   isosorbide mononitrate (IMDUR) 60 MG 24 hr tablet Take 60 mg by mouth daily. (Patient not taking: Reported on 07/16/2022)     No current facility-administered medications for this visit.     Objective:  BP 110/82   Pulse 68   Temp 97.8 F (36.6 C)   Ht 5' (1.524 m)   Wt 147 lb 3.2 oz (66.8 kg)   LMP 03/31/1977   SpO2 96%   BMI 28.75 kg/m  Gen: Appears uncomfortable-recurrent cough through visit Nasal turbinates edematous with pinkish discoloration-consistent with allergies, tympanic membrane's normal bilaterally, oropharynx largely normal other than some posterior drainage CV: RRR no murmurs rubs or gallops Lungs: At bilateral lung bases faint crackles heard.  Also note intermittent diffuse wheeze Ext: no edema Skin: warm, dry  Results for orders placed or performed in visit on 07/16/22 (from the past 24 hour(s))  POC COVID-19     Status: None   Collection Time: 07/16/22  9:27 AM  Result Value Ref Range   SARS Coronavirus 2 Ag Negative Negative       Assessment and Plan    #  Chronic cough S: Patient with ongoing issues with cough since at least December -We discussed that December visit issues with reactive airways-albuterol as needed in the past but had needed more in the last month at that time.  She has always had some issues when getting exposed to dust/pollen.  She noted some morning cough and more stuffiness but had been a bad allergy season.  Does not tolerate nasal sprays well.  We had advised a trial of Claritin/Allegra/Xyzal for 2 weeks to see if it made a difference.  On for 28 2024 she saw Dr. Mardelle Matte for ongoing cough with increased productivity-concern for community-acquired pneumonia versus bronchitis-they opted to treat with azithromycin and she had a chest x-ray at that time which showed no acute cardiopulmonary disease  She reached out by MyChart on 07/04/2022 with ongoing morning cough as well as need to use her inhaler more frequently.  She was concerned seasonal allergies could be contributing as well.  Due to underlying chronicity of cough we obtain a CT scan of her chest  Results were as follows "IMPRESSION: 1. No acute cardiopulmonary disease. 2. Moderately advanced apical predominant centrilobular emphysematous change. Otherwise, no explanation for patient's chronic/persistent cough. Emphysema (ICD10-J43.9). 3. Scattered punctate bilateral pulmonary nodules, the largest of which within the right lower lobe measures 6 mm in diameter for which a non-contrast chest CT at 3-6 months is recommended. If the nodules are stable at time of repeat CT, then future CT at 18-24 months (from today's scan) is considered optional for low-risk patients, but is recommended for high-risk patients. This recommendation follows the consensus statement: Guidelines for Management of Incidental Pulmonary Nodules Detected on CT Images: From the Fleischner Society 2017; Radiology 2017; 284:228-243. 4. Coronary artery calcifications. Aortic  Atherosclerosis (ICD10-I70.0). 5. Borderline enlarged caliber of the main pulmonary artery, nonspecific though could be seen in the setting of pulmonary arterial hypertension. Further evaluation with cardiac echo could be performed as indicated. 6. Cholelithiasis without evidence of superimposed acute cholecystitis on this noncontrast examination."  With noted emphysema we discussed referral to pulmonology which was placed-she has upcoming visit with Dr. Judeth Horn  She does have some underlying pulmonary nodules and recommendation was for 3 to 26-month repeat-she is agreeable for this repeat at later date  With note of increased pulmonary artery pressure-recommended her to discuss with cardiology-also requested my team to forward a copy to her [facility] cardiologist  On 07/09/2022 we opted to send in Augmentin with ongoing sinus drainage to see if sinusitis could be a cause.  We also refilled cough medicine with codeine.  She had some improvement with azithromycin and cough medicine at first- this most recent time with Augmentin and cough medicine almost no benefit. Has been wheezing more. They got an air purifier to see if that would help in the bedroom. -gets a lot of edema with prednisone and swelling and with methylprednisolone had panic attacks, trouble with concentration, hyperactive, hyperventilating.  Has tolerated dexamethasone in the past - a lot of sinus pressure and discharge still noted -No reported history of acid reflux - Mild weight gain in last several months -Decreased smelling sensation  A/P: Chronic cough for at least 6 months likely multifactorial - CT scan with potential emphysema.  Upcoming visit with pulmonology.  We initially had planned on trying dexamethasone (does not tolerate prednisone or methylprednisolone) but given some improvement with albuterol and symptoms though short-term we opted to try Symbicort for 3 to 4 days and if not improving can then take the  dexamethasone - Ongoing allergies likely contributing-continue Astelin and Allegra-sent in a prescription for Astelin (she has been using her husband's) -Question if GERD could be contributing but they wanted to hold off on reflux medicine for now - Try cough suppression with Tessalon and mucolytic with Mucinex -Not on ACE inhibitor -Ongoing sinus pressure and discomfort-will get CT of the sinuses -With some weight gain will  check BNP in addition to updating CBC, CMP, TSH (though with BNP no fluid overload on chest CT and no edema in her legs).  We discussed I have seen some cases of CHF which presented atypically with cough and no overt fluid overload -Close follow-up on next Thursday 10:20 am    Recommended follow up: Return for next already scheduled visit or sooner if needed. Future Appointments  Date Time Provider Department Center  07/21/2022  1:00 PM [facility] 1 [facility]  07/24/2022 10:20 AM Shelva Majestic, MD [facility]  07/30/2022  2:00 PM Hunsucker, Lesia Sago, MD [facility] None  03/23/2023 10:20 AM Shelva Majestic, MD [facility]   Lab/Order associations:   ICD-10-CM   1. Cough, unspecified type  R05.9 POC COVID-19    CT MAXILLOFACIAL LIMITED WO CONTRAST    2. Anosmia  R43.0 CT MAXILLOFACIAL LIMITED WO CONTRAST    3. Sinus pressure  J34.89 CT MAXILLOFACIAL LIMITED WO CONTRAST    4. Sinus drainage  J34.89 CT MAXILLOFACIAL LIMITED WO CONTRAST    5. Chronic cough  R05.3 CBC with Differential/Platelet    Comp Met (CMET)    TSH    B Nat Peptide    6. Weight gain  R63.5 CBC with Differential/Platelet    Comp Met (CMET)    TSH    B Nat Peptide      Meds ordered this encounter  Medications   dexamethasone (DECADRON) 4 MG tablet    Sig: Take 1 tablet by mouth three times daily for 2 days, then 1 tablet twice a day for 2 days, then 1 tablet once a day for 1 day, then stop    Dispense:  11 tablet    Refill:  0   benzonatate (TESSALON) 100 MG capsule     Sig: Take 1 capsule (100 mg total) by mouth 3 (three) times daily as needed for cough.    Dispense:  30 capsule    Refill:  0   azelastine (ASTELIN) 0.1 % nasal spray    Sig: Place 2 sprays into both nostrils 2 (two) times daily.    Dispense:  30 mL    Refill:  12   budesonide-formoterol (SYMBICORT) 160-4.5 MCG/ACT inhaler    Sig: Inhale 2 puffs into the lungs 2 (two) times daily.    Dispense:  10.2 g    Refill:  3    Time Spent: 41 minutes of total time (9:03 AM-9:44 AM) was spent on the date of the encounter performing the following actions: chart review prior to seeing the patient including summarizing prior visits/workup, obtaining history, performing a medically necessary exam, counseling on the treatment plan, placing orders, and documenting in our EHR.    Return precautions advised.  Tana Conch, MD

## 2022-07-18 ENCOUNTER — Telehealth: Payer: Self-pay | Admitting: Family Medicine

## 2022-07-18 ENCOUNTER — Other Ambulatory Visit: Payer: Self-pay | Admitting: Family Medicine

## 2022-07-18 NOTE — Telephone Encounter (Signed)
Error

## 2022-07-21 ENCOUNTER — Inpatient Hospital Stay: Admission: RE | Admit: 2022-07-21 | Payer: Medicare Other | Source: Ambulatory Visit

## 2022-07-24 ENCOUNTER — Encounter: Payer: Self-pay | Admitting: Family Medicine

## 2022-07-24 ENCOUNTER — Ambulatory Visit (INDEPENDENT_AMBULATORY_CARE_PROVIDER_SITE_OTHER): Payer: Medicare Other | Admitting: Family Medicine

## 2022-07-24 ENCOUNTER — Ambulatory Visit
Admission: RE | Admit: 2022-07-24 | Discharge: 2022-07-24 | Disposition: A | Discharge status: Home / Self Care | Payer: Medicare Other | Source: Ambulatory Visit | Attending: Family Medicine | Admitting: Family Medicine

## 2022-07-24 VITALS — BP 102/60 | HR 67 | Temp 97.2°F | Ht 60.0 in | Wt 143.0 lb

## 2022-07-24 DIAGNOSIS — J439 Emphysema, unspecified: Secondary | ICD-10-CM

## 2022-07-24 DIAGNOSIS — R430 Anosmia: Secondary | ICD-10-CM

## 2022-07-24 DIAGNOSIS — N183 Chronic kidney disease, stage 3 unspecified: Secondary | ICD-10-CM

## 2022-07-24 DIAGNOSIS — R053 Chronic cough: Secondary | ICD-10-CM

## 2022-07-24 DIAGNOSIS — I10 Essential (primary) hypertension: Secondary | ICD-10-CM

## 2022-07-24 DIAGNOSIS — J3489 Other specified disorders of nose and nasal sinuses: Secondary | ICD-10-CM | POA: Diagnosis not present

## 2022-07-24 DIAGNOSIS — E785 Hyperlipidemia, unspecified: Secondary | ICD-10-CM | POA: Diagnosis not present

## 2022-07-24 DIAGNOSIS — R059 Cough, unspecified: Secondary | ICD-10-CM

## 2022-07-24 NOTE — Patient Instructions (Addendum)
Lets use Symbicort consistently until you see pulmonology  Thrilled you are better but hoping we can get some further improvement for you  Ill keep an eye out on CT  I am happy to prescribe proton pump inhibitor (PPI) stomach acid reducer after discussion with Dr. Judeth Horn

## 2022-07-24 NOTE — Progress Notes (Signed)
Phone [phone] In person visit   Subjective:   Kari Welch is a 77 y.o. year old very pleasant female patient who presents for/with See problem oriented charting Chief Complaint  Patient presents with   Cough    Pt is here for 1 week f/u on cough, pt is doing better    Past Medical History-  Patient Active Problem List   Diagnosis Date Noted   Emphysema lung 07/08/2022    Priority: High   Allergy to alpha-gal 05/21/2021    Priority: High   Urticaria 03/15/2020    Priority: High   Chronic back pain 11/29/2013    Priority: High   Hypercoagulable state (HCC) 05/26/2012    Priority: High   Paroxysmal digital cyanosis 05/26/2012    Priority: High   Vasculopathy 11/29/2007    Priority: High   Osteopenia of left femoral neck 06/20/2021    Priority: Medium    Elevated TSH 03/10/2018    Priority: Medium    CKD (chronic kidney disease), stage III 03/04/2017    Priority: Medium    Transaminitis 05/02/2015    Priority: Medium    Adjustment disorder with anxiety 11/29/2013    Priority: Medium    Intermittent claudication 05/26/2012    Priority: Medium    Hyperlipidemia 02/04/2010    Priority: Medium    Essential hypertension 11/19/2006    Priority: Medium    Cerumen impaction 03/04/2017    Priority: Low   Varicose veins of right lower extremity with pain     Priority: Low   Macrocytosis without anemia 11/29/2013    Priority: Low   Migraine    Arthritis 11/15/2015    Medications- reviewed and updated Current Outpatient Medications  Medication Sig Dispense Refill   albuterol (PROAIR HFA) 108 (90 Base) MCG/ACT inhaler Inhale 1-2 puffs into the lungs every 6 (six) hours as needed for wheezing or shortness of breath. 18 g 0   amLODipine (NORVASC) 5 MG tablet Take 1 tablet (5 mg total) by mouth 2 (two) times daily. 180 tablet 3   aspirin 81 MG chewable tablet Chew 81 mg by mouth daily.     azelastine (ASTELIN) 0.1 % nasal spray Place 2 sprays into both nostrils 2  (two) times daily. 30 mL 12   cilostazol (PLETAL) 100 MG tablet Take 1 tablet (100 mg total) by mouth 2 (two) times daily 180 tablet 3   cyanocobalamin 1000 MCG tablet Take 1,000 mcg by mouth daily.     dexamethasone (DECADRON) 4 MG tablet Take 1 tablet by mouth three times daily for 2 days, then 1 tablet twice a day for 2 days, then 1 tablet once a day for 1 day, then stop 11 tablet 0   EPINEPHrine 0.3 mg/0.3 mL IJ SOAJ injection Inject 0.3 mg into the muscle as needed for anaphylaxis. 2 each 0   estradiol (CLIMARA - DOSED IN MG/24 HR) 0.0375 mg/24hr patch Place 1 patch (0.0375 mg total) onto the skin once a week. 12 patch 3   FLUoxetine (PROZAC) 40 MG capsule TAKE (1) CAPSULE DAILY. 90 capsule 0   FLUoxetine (PROZAC) 40 MG capsule Take 1 capsule (40 mg total) by mouth daily. 90 capsule 3   HYDROcodone-acetaminophen (NORCO) 10-325 MG tablet TAKE (1) TABLET EVERY FOUR TO SIX HOURS AS NEEDED. Do not drive for 6 hours after use. 30 tablet 0   isosorbide mononitrate (IMDUR) 60 MG 24 hr tablet Take 1 tablet (60 mg total) by mouth daily. 90 tablet 3   potassium  chloride SA (K-DUR,KLOR-CON) 20 MEQ tablet TAKE 1 TABLET DAILY. 30 tablet 0   Rimegepant Sulfate (NURTEC) 75 MG TBDP Take 75 mg by mouth daily as needed. 8 tablet 5   rosuvastatin (CRESTOR) 10 MG tablet Take 1 tablet (10 mg total) by mouth once daily 90 tablet 3   triamterene-hydrochlorothiazide (MAXZIDE-25) 37.5-25 MG tablet Take 1 tablet by mouth once daily 90 tablet 3   VENTOLIN HFA 108 (90 Base) MCG/ACT inhaler USE 1-2 PUFFS EVERY 6 HOURS AS NEEDED FOR WHEEZING OR SHORTNESS OF BREATH. 18 g 0   ALPRAZolam (XANAX) 0.5 MG tablet TAKE 1 TABLET EVERY 8 HOURS AS NEEDED FOR ANXIETY. Do not drive for 8 hours after use. (Patient not taking: Reported on 07/24/2022) 30 tablet 0   budesonide-formoterol (SYMBICORT) 160-4.5 MCG/ACT inhaler Inhale 2 puffs into the lungs 2 (two) times daily. (Patient not taking: Reported on 07/24/2022) 10.2 g 3   ondansetron  (ZOFRAN-ODT) 4 MG disintegrating tablet Take 1 tablet (4 mg total) by mouth every 8 (eight) hours as needed for nausea or vomiting. (Patient not taking: Reported on 07/24/2022) 20 tablet 0   No current facility-administered medications for this visit.     Objective:  BP 102/60   Pulse 67   Temp (!) 97.2 F (36.2 C)   Ht 5' (1.524 m)   Wt 143 lb (64.9 kg)   LMP 03/31/1977   SpO2 93%   BMI 27.93 kg/m  Gen: NAD, resting comfortably Nasal turbinates less edematous than last visit-appear largely normal, tympanic membranes normal bilaterally CV: RRR no murmurs rubs or gallops Lungs: CTAB no crackles, wheeze, rhonchi, able to take deep breaths this week without coughing-lungs much more clear Ext: no edema Skin: warm, dry    Assessment and Plan    # Chronic cough S: To recap patient has had about 6 months of cough at least (possibly 12)which we have thought is likely multifactorial.  Most of ocough has been coughing hacking 30 minutes first thing in the morning but at times flares more significantly like recent weeks. CT scan of the lungs with potential emphysema with upcoming visit with pulmonology - For this portion we opted to try Symbicort for 3 to 4 days with plan to try dexamethasone if not improving. She has not had to use the dexamethasone as pretty significant improvement (though not resolution) of symptoms.  Also Due to persistence of cough we tried suppression with Tessalon and mucolytic with Mucinex- within 48 hours noted improvement. Also doing air purifier  We were also concerned allergies were contributing-had her continue Astelin and Allegra-updated her prescription for Astelin last visit. Did not continue astelin though yet things improved- wonders if air purifeir helped  We considered acid reflux as a contributing factor but they wanted to hold off on reflux medicine last visit.  Not on ACE inhibitor  Significant sinus pressure has been noted and we ordered a CT of the  sinuses-she is set up for this afternoon-got delayed due to need for peer-to-peer. Some improvement in last week.   Labs largely reassuring last visit other than mild TSH elevation but under 10 typically do not treat- noted CKD III range labs as well   A/P: 6 to 12 months of chronic cough-most of cough is a hacking cough in the morning but has significant flares at times such as in recent weeks - With prior flare azithromycin was helpful, most recent flare Augmentin was not helpful - Seemed to have significant improvement between Symbicort, Tessalon, air purifier -  Pending sinus CT-if any significant obstruction consider ENT consult with Dr. Jenne Pane.  If lingering sinusitis consider something like doxycycline given prior improvement with azithromycin but not with Augmentin-possible atypical bacteria -We also strongly considered PPI in case reflux element but they wanted to hold off for now and reach out to me if they change their mind later or discussed with pulmonology and opt in at that time   #hyperlipidemia/elevated LFTs in the past # Aortic atherosclerosis and coronary artery calcifications S: Medication:Rosuvastatin 10 mg daily.  LDL goal at least under 100 but likely would prefer under 70 with microvascular disease-also discussed that aortic atherosclerosis and coronary artery calcifications present similar risks to microvascular disease Transaminitis-thought potentially related to statin use in the past.  Normal recently  Lab Results  Component Value Date   CHOL 181 03/20/2022   HDL 81.60 03/20/2022   LDLCALC 82 03/20/2022   LDLDIRECT 58.0 09/18/2020   TRIG 85.0 03/20/2022   CHOLHDL 2 03/20/2022    A/P: Mild poor control in light of aortic atherosclerosis and LDL goal under 70-she is going to see if she can tolerate taking an extra dose once or twice a week of rosuvastatin 10 mg and we can recheck at future visit-also consider recent checking TSH and recheck this as hypothyroidism can  increase lipids and last TSH was mildly elevated but not in range where we need to start medicine -She and her husband also plan to work on [employer] which could be helpful   #hypertension S: medication: Amlodipine 5 mg twice daily, Imdur 60 mg daily, triamterene hydrochlorothiazide 37.5-25 mg.  Also takes potassium BP Readings from Last 3 Encounters:  07/24/22 102/60  07/16/22 110/82  05/28/22 130/64    A/P: Blood pressure is well-controlled-if persistently this low we could consider lowering medicine but typically runs higher and seems to be tolerating this dose so we opted to make no changes today   #Chronic kidney disease stage III S: GFR is typically in the 50s range-has dipped into the 40s with poor hydration in the past including on most recent labs -Patient knows to avoid NSAIDs  A/P: On most recent labs mild decrease-encouraged increased hydration-otherwise monitor  Recommended follow up: Return for next already scheduled visit or sooner if needed. Future Appointments  Date Time Provider Department Center  07/30/2022  2:00 PM Hunsucker, Lesia Sago, MD [facility] None  03/23/2023 10:20 AM Shelva Majestic, MD [facility]    Lab/Order associations:   ICD-10-CM   1. Chronic cough  R05.3     2. Essential hypertension  I10     3. Hyperlipidemia, unspecified hyperlipidemia type  E78.5     4. Stage 3 chronic kidney disease, unspecified whether stage 3a or 3b CKD  N18.30     5. Pulmonary emphysema, unspecified emphysema type  J43.9       Time Spent: 43 minutes of total time (10:40 AM-11:13 AM, 2:28 PM- 2:38 PM) was spent on the date of the encounter performing the following actions: chart review prior to seeing the patient including review of recent visit and labs, obtaining history, performing a medically necessary exam, counseling on the treatment plan and potential further workup or down the line treatment options, and documenting in our EHR.    Return  precautions advised.  Tana Conch, MD

## 2022-07-30 ENCOUNTER — Encounter: Payer: Self-pay | Admitting: Pulmonary Disease

## 2022-07-30 ENCOUNTER — Ambulatory Visit: Payer: Medicare Other | Admitting: Pulmonary Disease

## 2022-07-30 VITALS — BP 110/68 | HR 72 | Ht 61.0 in | Wt 143.2 lb

## 2022-07-30 DIAGNOSIS — R918 Other nonspecific abnormal finding of lung field: Secondary | ICD-10-CM

## 2022-07-30 NOTE — Patient Instructions (Signed)
Nice to see you  Take Symbicort 2 puff twice a day every day, rinse mouth after every use  Use flonase 1 spray each nostril twice a day and Astelin 1 spray twice a day every day   Consider taking an antihistamine at night - cetirizine would be a reasonable choice  Return to clinic in 2 months or sooner as needed

## 2022-07-30 NOTE — Progress Notes (Signed)
@Patient  ID: Kari Welch, female    DOB: December 26, 1945, 77 y.o.   MRN: [id number]  Chief Complaint  Patient presents with   Consult    Referring provider: Shelva Majestic, MD  HPI:   77 y.o. woman whom we are seeing for evaluation of chronic cough.  Most recent PCP note x 4 reviewed.  Patient complains of cough.  Present for months.  At least 6 months if not longer.  Typically worse in the mornings.  Hacking cough.  Sometimes brings up phlegm.  Within the also has coughing flares throughout the day.  Seems to be some variability in terms of severity and frequency day-to-day.  No position to make things better or worse.  No seasonal or environmental factors she can identify to make things better or worse.  No really alleviating or exacerbating factors.  Recently prescribed Symbicort.   She had chest imaging 05/2022, chest x-ray that on my review and interpretation reveals clear lungs with mild hyperinflation.  More recently, she had a CT chest that on my review interpretation shows fairly significant emphysematous changes with a predilection for the upper lobes, otherwise clear lungs, scattered nodules.   Questionaires / Pulmonary Flowsheets:   ACT:      No data to display          MMRC:     No data to display          Epworth:      No data to display          Tests:   FENO:  No results found for: "NITRICOXIDE"  PFT:     No data to display          WALK:      No data to display          Imaging: Personally reviewed and as per EMR No results found.  Lab Results: Personally reviewed CBC    Component Value Date/Time   WBC 5.3 07/16/2022 1000   RBC 4.04 07/16/2022 1000   HGB 14.1 07/16/2022 1000   HCT 41.7 07/16/2022 1000   PLT 354.0 07/16/2022 1000   MCV 103.2 (H) 07/16/2022 1000   MCH 35.4 (H) 03/15/2020 1155   MCHC 33.9 07/16/2022 1000   RDW 14.1 07/16/2022 1000   LYMPHSABS 2.1 07/16/2022 1000   MONOABS 0.5 07/16/2022 1000    EOSABS 0.2 07/16/2022 1000   BASOSABS 0.0 07/16/2022 1000    BMET    Component Value Date/Time   NA 140 07/16/2022 1000   K 3.7 07/16/2022 1000   CL 104 07/16/2022 1000   CO2 24 07/16/2022 1000   GLUCOSE 96 07/16/2022 1000   GLUCOSE 83 01/27/2006 1158   BUN 22 07/16/2022 1000   CREATININE 1.18 07/16/2022 1000   CREATININE 1.05 (H) 03/15/2020 1155   CALCIUM 9.3 07/16/2022 1000   GFRNONAA 53 (L) 03/15/2020 1155   GFRAA 61 03/15/2020 1155    BNP No results found for: "BNP"  ProBNP    Component Value Date/Time   PROBNP 34.0 07/16/2022 1000    Specialty Problems       Pulmonary Problems   Emphysema lung (HCC)    Noted on CT April 2024       Allergies  Allergen Reactions   Methylprednisolone     With IM injection- Panic attacks, trouble with concentration, hyperactive, hyperventilation   Prednisone Swelling    short course=swelling    Immunization History  Administered Date(s) Administered   COVID-19, mRNA, vaccine(Comirnaty)12 years  and older 01/15/2022   DTaP 12/25/2009   Fluad Quad(high Dose 65+) 12/21/2018, 02/14/2020, 01/08/2021, 01/15/2022   Influenza Split 01/17/2011   Influenza, High Dose Seasonal PF 02/26/2015, 04/24/2016, 01/20/2017, 03/10/2018   Influenza,inj,Quad PF,6+ Mos 01/07/2013, 01/13/2014   PFIZER Comirnaty(Gray Top)Covid-19 Tri-Sucrose Vaccine 01/15/2022   PFIZER(Purple Top)SARS-COV-2 Vaccination 05/06/2019, 05/27/2019, 12/06/2019   Pfizer Covid-19 Vaccine Bivalent Booster 32yrs & up 01/28/2021   Pneumococcal Conjugate-13 04/19/2013   Pneumococcal Polysaccharide-23 05/02/2015   Tdap 03/31/2008   Zoster Recombinat (Shingrix) 10/03/2020, 03/01/2021    Past Medical History:  Diagnosis Date   Abnormal Pap smear    h/o   Allergy    seasonal   Arthritis    Collagen vascular disease (HCC)    Elevated LFTs    Work up negative - Dr. Juanda Chance   Hyperlipidemia    Hyperplastic colon polyp    Hypertension    Internal hemorrhoids     INTERNAL HEMORRHOIDS 01/24/2009   Qualifier: Diagnosis of  By: Candice Camp CMA (AAMA), Dottie     Migraine    Prothrombin gene mutation (HCC)    heterozygous   PVD (peripheral vascular disease) (HCC)    takes Pletal to open blood flow of blood to fingers, toes, and lower extremities   Raynaud's disease    Vasculitis (HCC)    Venous insufficiency    superficial left leg- seeing a vein specialist in August    Tobacco History: Social History   Tobacco Use  Smoking Status Former   Packs/day: 0.50   Years: 18.00   Additional pack years: 0.00   Total pack years: 9.00   Types: Cigarettes   Quit date: 10/10/1988   Years since quitting: 33.9  Smokeless Tobacco Never   Counseling given: Not Answered   Continue to not smoke  Outpatient Encounter Medications as of 07/30/2022  Medication Sig   albuterol (PROAIR HFA) 108 (90 Base) MCG/ACT inhaler Inhale 1-2 puffs into the lungs every 6 (six) hours as needed for wheezing or shortness of breath.   amLODipine (NORVASC) 5 MG tablet Take 1 tablet (5 mg total) by mouth 2 (two) times daily.   aspirin 81 MG chewable tablet Chew 81 mg by mouth daily.   budesonide-formoterol (SYMBICORT) 160-4.5 MCG/ACT inhaler Inhale 2 puffs into the lungs 2 (two) times daily.   cilostazol (PLETAL) 100 MG tablet Take 1 tablet (100 mg total) by mouth 2 (two) times daily   cyanocobalamin 1000 MCG tablet Take 1,000 mcg by mouth daily.   EPINEPHrine 0.3 mg/0.3 mL IJ SOAJ injection Inject 0.3 mg into the muscle as needed for anaphylaxis.   estradiol (CLIMARA - DOSED IN MG/24 HR) 0.0375 mg/24hr patch Place 1 patch (0.0375 mg total) onto the skin once a week.   Fexofenadine HCl (MUCINEX ALLERGY PO) Take by mouth.   FLUoxetine (PROZAC) 40 MG capsule TAKE (1) CAPSULE DAILY.   FLUoxetine (PROZAC) 40 MG capsule Take 1 capsule (40 mg total) by mouth daily.   isosorbide mononitrate (IMDUR) 60 MG 24 hr tablet Take 1 tablet (60 mg total) by mouth daily.   montelukast  (SINGULAIR) 10 MG tablet Take 10 mg by mouth at bedtime.   ondansetron (ZOFRAN-ODT) 4 MG disintegrating tablet Take 1 tablet (4 mg total) by mouth every 8 (eight) hours as needed for nausea or vomiting.   potassium chloride SA (K-DUR,KLOR-CON) 20 MEQ tablet TAKE 1 TABLET DAILY.   Rimegepant Sulfate (NURTEC) 75 MG TBDP Take 75 mg by mouth daily as needed.   rosuvastatin (CRESTOR) 10 MG tablet Take  1 tablet (10 mg total) by mouth once daily   triamterene-hydrochlorothiazide (MAXZIDE-25) 37.5-25 MG tablet Take 1 tablet by mouth once daily   VENTOLIN HFA 108 (90 Base) MCG/ACT inhaler USE 1-2 PUFFS EVERY 6 HOURS AS NEEDED FOR WHEEZING OR SHORTNESS OF BREATH.   [DISCONTINUED] ALPRAZolam (XANAX) 0.5 MG tablet TAKE 1 TABLET EVERY 8 HOURS AS NEEDED FOR ANXIETY. Do not drive for 8 hours after use.   [DISCONTINUED] HYDROcodone-acetaminophen (NORCO) 10-325 MG tablet TAKE (1) TABLET EVERY FOUR TO SIX HOURS AS NEEDED. Do not drive for 6 hours after use.   azelastine (ASTELIN) 0.1 % nasal spray Place 2 sprays into both nostrils 2 (two) times daily. (Patient not taking: Reported on 07/30/2022)   dexamethasone (DECADRON) 4 MG tablet Take 1 tablet by mouth three times daily for 2 days, then 1 tablet twice a day for 2 days, then 1 tablet once a day for 1 day, then stop (Patient not taking: Reported on 07/30/2022)   No facility-administered encounter medications on file as of 07/30/2022.     Review of Systems  Review of Systems  No chest pain with exertion.  No orthopnea or PND.  Comprehensive review of systems otherwise negative. Physical Exam  BP 110/68 (BP Location: Right Arm, Cuff Size: Normal)   Pulse 72   Ht 5\' 1"  (1.549 m)   Wt 143 lb 3.2 oz (65 kg)   LMP 03/31/1977   SpO2 94%   BMI 27.06 kg/m   Wt Readings from Last 5 Encounters:  07/30/22 143 lb 3.2 oz (65 kg)  07/24/22 143 lb (64.9 kg)  07/16/22 147 lb 3.2 oz (66.8 kg)  05/28/22 145 lb 6.4 oz (66 kg)  03/20/22 142 lb 3.2 oz (64.5 kg)    BMI  Readings from Last 5 Encounters:  07/30/22 27.06 kg/m  07/24/22 27.93 kg/m  07/16/22 28.75 kg/m  05/28/22 28.40 kg/m  03/20/22 27.77 kg/m     Physical Exam General: Sitting in chair, no acute distress Eyes: EOMI, no icterus Neck: Supple, no JVP Pulmonary: Clear, normal work of breathing Cardiovascular: Warm, no edema Abdomen: Nondistended, bowel sounds present MSK: No synovitis, no joint effusion Neuro: Normal gait, no weakness Psych: Normal mood, full affect   Assessment & Plan:   Chronic cough: Suspect multifactorial.  Related to history of smoking, emphysema on CT versus asthma.  Encourage use Symbicort 2 puffs twice a day.  Assess response.  Also concern for postnasal drip, Flonase, Astelin, antihistamine recommended.  Assess response.  Pulmonary nodules: Several, solid, largest 6 mm.  13-month follow-up recommended per Fleischner criteria, new CT ordered today for surveillance.  Return in about 2 months (around 09/29/2022).   Karren Burly, MD    This appointment required 60 minutes of patient care (this includes precharting, chart review, review of results, face-to-face care, etc.).

## 2022-08-05 ENCOUNTER — Other Ambulatory Visit (HOSPITAL_BASED_OUTPATIENT_CLINIC_OR_DEPARTMENT_OTHER): Payer: Self-pay

## 2022-08-05 ENCOUNTER — Telehealth: Payer: Self-pay | Admitting: Family Medicine

## 2022-08-05 NOTE — Telephone Encounter (Signed)
Prescription Request  08/05/2022  LOV: 07/24/2022  What is the name of the medication or equipment?  ALPRAZolam (XANAX) 0.5 MG tablet   Have you contacted your pharmacy to request a refill? Yes   Which pharmacy would you like this sent to?  [facility] [address] [place] [address] Phone: [phone] Fax: [phone]    Patient notified that their request is being sent to the clinical staff for review and that they should receive a response within 2 business days.   Please advise at Mobile [phone] (mobile)

## 2022-08-06 ENCOUNTER — Other Ambulatory Visit (HOSPITAL_BASED_OUTPATIENT_CLINIC_OR_DEPARTMENT_OTHER): Payer: Self-pay

## 2022-08-06 ENCOUNTER — Other Ambulatory Visit (HOSPITAL_COMMUNITY): Payer: Self-pay

## 2022-08-06 MED ORDER — ALPRAZOLAM 0.5 MG PO TABS
ORAL_TABLET | ORAL | 0 refills | Status: DC
  Filled 2022-08-06: qty 30, 10d supply, fill #0

## 2022-08-06 NOTE — Telephone Encounter (Signed)
sent 

## 2022-08-13 ENCOUNTER — Encounter: Payer: Self-pay | Admitting: Pulmonary Disease

## 2022-08-26 ENCOUNTER — Other Ambulatory Visit: Payer: Self-pay | Admitting: Family Medicine

## 2022-08-26 ENCOUNTER — Other Ambulatory Visit (HOSPITAL_BASED_OUTPATIENT_CLINIC_OR_DEPARTMENT_OTHER): Payer: Self-pay

## 2022-08-26 MED ORDER — HYDROCODONE-ACETAMINOPHEN 10-325 MG PO TABS
1.0000 | ORAL_TABLET | ORAL | 0 refills | Status: DC | PRN
  Filled 2022-08-26: qty 30, 5d supply, fill #0

## 2022-08-26 NOTE — Telephone Encounter (Signed)
Prescription Request  08/26/2022  LOV: 07/24/2022  What is the name of the medication or equipment?  HYDROcodone-acetaminophen (NORCO) 10-325 MG tablet   Have you contacted your pharmacy to request a refill? No   Which pharmacy would you like this sent to?  [facility] [address] [place] [address] Phone: [phone] Fax: [phone]    Patient notified that their request is being sent to the clinical staff for review and that they should receive a response within 2 business days.   Please advise at Mobile [phone] (mobile)

## 2022-08-27 ENCOUNTER — Other Ambulatory Visit: Payer: Self-pay

## 2022-09-08 ENCOUNTER — Other Ambulatory Visit: Payer: Self-pay | Admitting: Family Medicine

## 2022-09-08 DIAGNOSIS — R921 Mammographic calcification found on diagnostic imaging of breast: Secondary | ICD-10-CM

## 2022-10-01 ENCOUNTER — Other Ambulatory Visit (HOSPITAL_BASED_OUTPATIENT_CLINIC_OR_DEPARTMENT_OTHER): Payer: Self-pay

## 2022-10-01 DIAGNOSIS — M7502 Adhesive capsulitis of left shoulder: Secondary | ICD-10-CM | POA: Diagnosis not present

## 2022-10-01 MED ORDER — CELECOXIB 200 MG PO CAPS
200.0000 mg | ORAL_CAPSULE | Freq: Every day | ORAL | 0 refills | Status: DC
  Filled 2022-10-01: qty 30, 30d supply, fill #0

## 2022-10-09 DIAGNOSIS — M7502 Adhesive capsulitis of left shoulder: Secondary | ICD-10-CM | POA: Diagnosis not present

## 2022-10-10 ENCOUNTER — Other Ambulatory Visit (HOSPITAL_BASED_OUTPATIENT_CLINIC_OR_DEPARTMENT_OTHER): Payer: Self-pay

## 2022-10-13 ENCOUNTER — Encounter: Payer: Self-pay | Admitting: Pulmonary Disease

## 2022-10-14 ENCOUNTER — Ambulatory Visit: Payer: Medicare Other | Admitting: Pulmonary Disease

## 2022-10-14 NOTE — Addendum Note (Signed)
Addended by: Erick Blinks A on: 10/14/2022 04:51 PM   Modules accepted: Orders

## 2022-10-15 DIAGNOSIS — K080 Exfoliation of teeth due to systemic causes: Secondary | ICD-10-CM | POA: Diagnosis not present

## 2022-10-22 ENCOUNTER — Other Ambulatory Visit: Payer: Self-pay | Admitting: Family Medicine

## 2022-10-22 ENCOUNTER — Ambulatory Visit
Admission: RE | Admit: 2022-10-22 | Discharge: 2022-10-22 | Disposition: A | Discharge status: Home / Self Care | Payer: Medicare Other | Source: Ambulatory Visit | Attending: Family Medicine | Admitting: Family Medicine

## 2022-10-22 DIAGNOSIS — R921 Mammographic calcification found on diagnostic imaging of breast: Secondary | ICD-10-CM

## 2022-10-27 ENCOUNTER — Ambulatory Visit: Admission: RE | Admit: 2022-10-27 | Payer: Medicare Other | Source: Ambulatory Visit

## 2022-10-27 ENCOUNTER — Ambulatory Visit
Admission: RE | Admit: 2022-10-27 | Discharge: 2022-10-27 | Disposition: A | Discharge status: Home / Self Care | Payer: Medicare Other | Source: Ambulatory Visit | Attending: Family Medicine | Admitting: Family Medicine

## 2022-10-27 DIAGNOSIS — N6012 Diffuse cystic mastopathy of left breast: Secondary | ICD-10-CM | POA: Diagnosis not present

## 2022-10-27 DIAGNOSIS — R921 Mammographic calcification found on diagnostic imaging of breast: Secondary | ICD-10-CM

## 2022-10-27 HISTORY — PX: BREAST BIOPSY: SHX20

## 2022-10-30 ENCOUNTER — Other Ambulatory Visit (HOSPITAL_BASED_OUTPATIENT_CLINIC_OR_DEPARTMENT_OTHER): Payer: Self-pay

## 2022-10-30 ENCOUNTER — Other Ambulatory Visit: Payer: Self-pay | Admitting: Family Medicine

## 2022-10-30 MED ORDER — ALPRAZOLAM 0.5 MG PO TABS
0.5000 mg | ORAL_TABLET | Freq: Three times a day (TID) | ORAL | 0 refills | Status: DC | PRN
  Filled 2022-10-30: qty 30, 10d supply, fill #0

## 2022-10-31 ENCOUNTER — Telehealth: Payer: Self-pay | Admitting: Internal Medicine

## 2022-10-31 NOTE — Telephone Encounter (Signed)
Inbound call from patient husband stating patient is having sever upper gastric pain along with vomiting. Advised of next available appointment. Patient requesting a call back from Dr. Leone Payor nurse to discuss further. Please advise, thank you.

## 2022-10-31 NOTE — Telephone Encounter (Signed)
Left message on both patient & husband's number to call back.

## 2022-10-31 NOTE — Telephone Encounter (Signed)
Spoke with patient's husband (physician & patient of Dr. Leone Payor) and he stated his wife has had occasional upper abdominal pain and vomiting going on for some time now. Not currently experiencing any pain/vomiting at the moment. Scheduled OV for 11/07/22 at 10:30 am with Victorino Dike, [place]. He was a little hesitant about his wife seeing a PA, but there are currently no appointments available with Dr. Leone Payor until October and reassured him that the PA's do communicate with the MD's and any future procedure if needed would be done with MD only. Advised him that since it had been a while since she was last seen here that any advice in the mean time would need to come from PCP office. Husband verbalized all understanding.

## 2022-11-05 DIAGNOSIS — M25512 Pain in left shoulder: Secondary | ICD-10-CM | POA: Diagnosis not present

## 2022-11-06 DIAGNOSIS — M25512 Pain in left shoulder: Secondary | ICD-10-CM | POA: Diagnosis not present

## 2022-11-07 ENCOUNTER — Ambulatory Visit: Payer: Medicare Other | Admitting: Physician Assistant

## 2022-11-07 ENCOUNTER — Emergency Department (HOSPITAL_COMMUNITY): Payer: Medicare Other

## 2022-11-07 ENCOUNTER — Inpatient Hospital Stay (HOSPITAL_COMMUNITY)
Admission: EM | Admit: 2022-11-07 | Discharge: 2022-11-09 | DRG: 418 | Disposition: A | Discharge status: Home / Self Care | Payer: Medicare Other | Attending: Internal Medicine | Admitting: Internal Medicine

## 2022-11-07 ENCOUNTER — Encounter: Payer: Self-pay | Admitting: Physician Assistant

## 2022-11-07 ENCOUNTER — Other Ambulatory Visit: Payer: Self-pay

## 2022-11-07 ENCOUNTER — Inpatient Hospital Stay (HOSPITAL_COMMUNITY): Payer: Medicare Other

## 2022-11-07 VITALS — BP 90/60 | HR 90 | Ht 61.0 in | Wt 140.0 lb

## 2022-11-07 DIAGNOSIS — K8010 Calculus of gallbladder with chronic cholecystitis without obstruction: Secondary | ICD-10-CM | POA: Diagnosis not present

## 2022-11-07 DIAGNOSIS — I129 Hypertensive chronic kidney disease with stage 1 through stage 4 chronic kidney disease, or unspecified chronic kidney disease: Secondary | ICD-10-CM | POA: Diagnosis present

## 2022-11-07 DIAGNOSIS — E872 Acidosis, unspecified: Secondary | ICD-10-CM | POA: Diagnosis not present

## 2022-11-07 DIAGNOSIS — K8050 Calculus of bile duct without cholangitis or cholecystitis without obstruction: Secondary | ICD-10-CM | POA: Diagnosis not present

## 2022-11-07 DIAGNOSIS — R1011 Right upper quadrant pain: Secondary | ICD-10-CM | POA: Diagnosis not present

## 2022-11-07 DIAGNOSIS — I872 Venous insufficiency (chronic) (peripheral): Secondary | ICD-10-CM | POA: Diagnosis present

## 2022-11-07 DIAGNOSIS — I10 Essential (primary) hypertension: Secondary | ICD-10-CM | POA: Diagnosis not present

## 2022-11-07 DIAGNOSIS — K838 Other specified diseases of biliary tract: Secondary | ICD-10-CM | POA: Diagnosis not present

## 2022-11-07 DIAGNOSIS — R112 Nausea with vomiting, unspecified: Secondary | ICD-10-CM | POA: Diagnosis not present

## 2022-11-07 DIAGNOSIS — K8020 Calculus of gallbladder without cholecystitis without obstruction: Secondary | ICD-10-CM | POA: Diagnosis not present

## 2022-11-07 DIAGNOSIS — Z888 Allergy status to other drugs, medicaments and biological substances status: Secondary | ICD-10-CM | POA: Diagnosis not present

## 2022-11-07 DIAGNOSIS — G8929 Other chronic pain: Secondary | ICD-10-CM | POA: Diagnosis not present

## 2022-11-07 DIAGNOSIS — N1831 Chronic kidney disease, stage 3a: Secondary | ICD-10-CM | POA: Diagnosis present

## 2022-11-07 DIAGNOSIS — Z79899 Other long term (current) drug therapy: Secondary | ICD-10-CM

## 2022-11-07 DIAGNOSIS — K8064 Calculus of gallbladder and bile duct with chronic cholecystitis without obstruction: Secondary | ICD-10-CM | POA: Diagnosis not present

## 2022-11-07 DIAGNOSIS — I7300 Raynaud's syndrome without gangrene: Secondary | ICD-10-CM | POA: Diagnosis present

## 2022-11-07 DIAGNOSIS — K2289 Other specified disease of esophagus: Secondary | ICD-10-CM | POA: Diagnosis present

## 2022-11-07 DIAGNOSIS — Z8249 Family history of ischemic heart disease and other diseases of the circulatory system: Secondary | ICD-10-CM | POA: Diagnosis not present

## 2022-11-07 DIAGNOSIS — K2950 Unspecified chronic gastritis without bleeding: Secondary | ICD-10-CM | POA: Diagnosis present

## 2022-11-07 DIAGNOSIS — Z7982 Long term (current) use of aspirin: Secondary | ICD-10-CM

## 2022-11-07 DIAGNOSIS — N183 Chronic kidney disease, stage 3 unspecified: Secondary | ICD-10-CM | POA: Diagnosis present

## 2022-11-07 DIAGNOSIS — E8809 Other disorders of plasma-protein metabolism, not elsewhere classified: Secondary | ICD-10-CM | POA: Diagnosis not present

## 2022-11-07 DIAGNOSIS — R7989 Other specified abnormal findings of blood chemistry: Secondary | ICD-10-CM | POA: Diagnosis not present

## 2022-11-07 DIAGNOSIS — R1010 Upper abdominal pain, unspecified: Secondary | ICD-10-CM | POA: Diagnosis not present

## 2022-11-07 DIAGNOSIS — R1013 Epigastric pain: Secondary | ICD-10-CM | POA: Diagnosis not present

## 2022-11-07 DIAGNOSIS — E785 Hyperlipidemia, unspecified: Secondary | ICD-10-CM | POA: Diagnosis not present

## 2022-11-07 DIAGNOSIS — Z823 Family history of stroke: Secondary | ICD-10-CM

## 2022-11-07 DIAGNOSIS — I739 Peripheral vascular disease, unspecified: Secondary | ICD-10-CM | POA: Diagnosis present

## 2022-11-07 DIAGNOSIS — J439 Emphysema, unspecified: Secondary | ICD-10-CM | POA: Diagnosis not present

## 2022-11-07 DIAGNOSIS — Z87891 Personal history of nicotine dependence: Secondary | ICD-10-CM

## 2022-11-07 DIAGNOSIS — K449 Diaphragmatic hernia without obstruction or gangrene: Secondary | ICD-10-CM | POA: Diagnosis present

## 2022-11-07 DIAGNOSIS — Z9889 Other specified postprocedural states: Secondary | ICD-10-CM | POA: Diagnosis not present

## 2022-11-07 DIAGNOSIS — K831 Obstruction of bile duct: Principal | ICD-10-CM | POA: Diagnosis present

## 2022-11-07 DIAGNOSIS — R17 Unspecified jaundice: Secondary | ICD-10-CM | POA: Diagnosis not present

## 2022-11-07 DIAGNOSIS — D75839 Thrombocytosis, unspecified: Secondary | ICD-10-CM | POA: Diagnosis present

## 2022-11-07 DIAGNOSIS — Z7902 Long term (current) use of antithrombotics/antiplatelets: Secondary | ICD-10-CM | POA: Diagnosis not present

## 2022-11-07 DIAGNOSIS — K3189 Other diseases of stomach and duodenum: Secondary | ICD-10-CM | POA: Diagnosis not present

## 2022-11-07 DIAGNOSIS — K8070 Calculus of gallbladder and bile duct without cholecystitis without obstruction: Secondary | ICD-10-CM | POA: Diagnosis not present

## 2022-11-07 DIAGNOSIS — J440 Chronic obstructive pulmonary disease with acute lower respiratory infection: Secondary | ICD-10-CM | POA: Diagnosis not present

## 2022-11-07 DIAGNOSIS — R932 Abnormal findings on diagnostic imaging of liver and biliary tract: Secondary | ICD-10-CM | POA: Diagnosis not present

## 2022-11-07 LAB — URINALYSIS, ROUTINE W REFLEX MICROSCOPIC
Bilirubin Urine: NEGATIVE
Glucose, UA: NEGATIVE mg/dL
Hgb urine dipstick: NEGATIVE
Ketones, ur: NEGATIVE mg/dL
Leukocytes,Ua: NEGATIVE
Nitrite: NEGATIVE
Protein, ur: NEGATIVE mg/dL
Specific Gravity, Urine: 1.011 (ref 1.005–1.030)
pH: 6 (ref 5.0–8.0)

## 2022-11-07 LAB — CBC
HCT: 42.2 % (ref 36.0–46.0)
Hemoglobin: 14.3 g/dL (ref 12.0–15.0)
MCH: 34.5 pg — ABNORMAL HIGH (ref 26.0–34.0)
MCHC: 33.9 g/dL (ref 30.0–36.0)
MCV: 101.7 fL — ABNORMAL HIGH (ref 80.0–100.0)
Platelets: 403 10*3/uL — ABNORMAL HIGH (ref 150–400)
RBC: 4.15 MIL/uL (ref 3.87–5.11)
RDW: 13.2 % (ref 11.5–15.5)
WBC: 9.6 10*3/uL (ref 4.0–10.5)
nRBC: 0 % (ref 0.0–0.2)

## 2022-11-07 LAB — LIPASE, BLOOD: Lipase: 52 U/L — ABNORMAL HIGH (ref 11–51)

## 2022-11-07 LAB — COMPREHENSIVE METABOLIC PANEL
ALT: 139 U/L — ABNORMAL HIGH (ref 0–44)
AST: 206 U/L — ABNORMAL HIGH (ref 15–41)
Albumin: 3.6 g/dL (ref 3.5–5.0)
Alkaline Phosphatase: 394 U/L — ABNORMAL HIGH (ref 38–126)
Anion gap: 14 (ref 5–15)
BUN: 15 mg/dL (ref 8–23)
CO2: 24 mmol/L (ref 22–32)
Calcium: 9.4 mg/dL (ref 8.9–10.3)
Chloride: 97 mmol/L — ABNORMAL LOW (ref 98–111)
Creatinine, Ser: 1.18 mg/dL — ABNORMAL HIGH (ref 0.44–1.00)
GFR, Estimated: 48 mL/min — ABNORMAL LOW (ref >60)
Glucose, Bld: 109 mg/dL — ABNORMAL HIGH (ref 70–99)
Potassium: 3.4 mmol/L — ABNORMAL LOW (ref 3.5–5.1)
Sodium: 135 mmol/L (ref 135–145)
Total Bilirubin: 3 mg/dL — ABNORMAL HIGH (ref 0.3–1.2)
Total Protein: 8.3 g/dL — ABNORMAL HIGH (ref 6.5–8.1)

## 2022-11-07 LAB — PHOSPHORUS: Phosphorus: 3.6 mg/dL (ref 2.5–4.6)

## 2022-11-07 LAB — MAGNESIUM: Magnesium: 1.8 mg/dL (ref 1.7–2.4)

## 2022-11-07 MED ORDER — ALPRAZOLAM 0.5 MG PO TABS: 0.5000 mg | ORAL_TABLET | Freq: Three times a day (TID) | ORAL | Status: DC | PRN

## 2022-11-07 MED ORDER — VITAMIN B-12 1000 MCG PO TABS
1000.0000 ug | ORAL_TABLET | Freq: Every day | ORAL | Status: DC
  Administered 2022-11-08 – 2022-11-09 (×2): 1000 ug via ORAL
  Filled 2022-11-07 (×2): qty 1

## 2022-11-07 MED ORDER — PANTOPRAZOLE SODIUM 40 MG IV SOLR
40.0000 mg | INTRAVENOUS | Status: DC
  Administered 2022-11-07 – 2022-11-08 (×2): 40 mg via INTRAVENOUS
  Filled 2022-11-07 (×2): qty 10

## 2022-11-07 MED ORDER — CILOSTAZOL 100 MG PO TABS
100.0000 mg | ORAL_TABLET | Freq: Two times a day (BID) | ORAL | Status: DC
  Administered 2022-11-07 – 2022-11-09 (×4): 100 mg via ORAL
  Filled 2022-11-07 (×5): qty 1

## 2022-11-07 MED ORDER — AMLODIPINE BESYLATE 5 MG PO TABS
5.0000 mg | ORAL_TABLET | Freq: Two times a day (BID) | ORAL | Status: DC
  Administered 2022-11-07 – 2022-11-08 (×2): 5 mg via ORAL
  Filled 2022-11-07 (×3): qty 1

## 2022-11-07 MED ORDER — SODIUM CHLORIDE 0.9 % IV BOLUS
1000.0000 mL | Freq: Once | INTRAVENOUS | Status: AC
  Administered 2022-11-07: 1000 mL via INTRAVENOUS

## 2022-11-07 MED ORDER — POTASSIUM CHLORIDE IN NACL 20-0.9 MEQ/L-% IV SOLN
INTRAVENOUS | Status: AC
  Filled 2022-11-07: qty 1000

## 2022-11-07 MED ORDER — OXYCODONE HCL 5 MG PO TABS: 5.0000 mg | ORAL_TABLET | ORAL | Status: DC | PRN

## 2022-11-07 MED ORDER — ACETAMINOPHEN 650 MG RE SUPP: 650.0000 mg | Freq: Four times a day (QID) | RECTAL | Status: DC | PRN

## 2022-11-07 MED ORDER — ISOSORBIDE MONONITRATE ER 60 MG PO TB24
60.0000 mg | ORAL_TABLET | Freq: Every day | ORAL | Status: DC
  Administered 2022-11-08 – 2022-11-09 (×2): 60 mg via ORAL
  Filled 2022-11-07 (×2): qty 1

## 2022-11-07 MED ORDER — IOHEXOL 300 MG/ML  SOLN
100.0000 mL | Freq: Once | INTRAMUSCULAR | Status: AC | PRN
  Administered 2022-11-07: 100 mL via INTRAVENOUS

## 2022-11-07 MED ORDER — POTASSIUM CHLORIDE IN NACL 20-0.9 MEQ/L-% IV SOLN
INTRAVENOUS | Status: AC
  Filled 2022-11-07 (×2): qty 1000

## 2022-11-07 MED ORDER — ONDANSETRON HCL 4 MG PO TABS: 4.0000 mg | ORAL_TABLET | Freq: Four times a day (QID) | ORAL | Status: DC | PRN

## 2022-11-07 MED ORDER — HYDROMORPHONE HCL 1 MG/ML IJ SOLN: 0.5000 mg | INTRAMUSCULAR | Status: DC | PRN

## 2022-11-07 MED ORDER — ACETAMINOPHEN 325 MG PO TABS
650.0000 mg | ORAL_TABLET | Freq: Four times a day (QID) | ORAL | Status: DC | PRN
  Filled 2022-11-07: qty 2

## 2022-11-07 MED ORDER — MORPHINE SULFATE (PF) 4 MG/ML IV SOLN
4.0000 mg | Freq: Once | INTRAVENOUS | Status: DC
  Filled 2022-11-07: qty 1

## 2022-11-07 MED ORDER — ONDANSETRON HCL 4 MG/2ML IJ SOLN
4.0000 mg | Freq: Once | INTRAMUSCULAR | Status: DC
  Filled 2022-11-07: qty 2

## 2022-11-07 MED ORDER — HYDROCODONE-ACETAMINOPHEN 5-325 MG PO TABS: 1.0000 | ORAL_TABLET | Freq: Once | ORAL | Status: DC

## 2022-11-07 MED ORDER — ONDANSETRON HCL 4 MG/2ML IJ SOLN: 4.0000 mg | Freq: Four times a day (QID) | INTRAMUSCULAR | Status: DC | PRN

## 2022-11-07 MED ORDER — FLUOXETINE HCL 20 MG PO CAPS
40.0000 mg | ORAL_CAPSULE | Freq: Every day | ORAL | Status: DC
  Administered 2022-11-08 – 2022-11-09 (×2): 40 mg via ORAL
  Filled 2022-11-07 (×2): qty 2

## 2022-11-07 MED ORDER — ASPIRIN 81 MG PO CHEW
81.0000 mg | CHEWABLE_TABLET | Freq: Every day | ORAL | Status: DC
  Administered 2022-11-09: 81 mg via ORAL
  Filled 2022-11-07 (×2): qty 1

## 2022-11-07 MED ORDER — PANTOPRAZOLE SODIUM 40 MG IV SOLR: 40.0000 mg | INTRAVENOUS | Status: DC

## 2022-11-07 NOTE — ED Provider Notes (Signed)
[place] [facility]ERGENCY DEPARTMENT AT [facility] Provider Note   CSN: [id number] Arrival date & time: 11/07/22  1109     History  Chief Complaint  Patient presents with   Abdominal Pain    Kari Welch is a 77 y.o. female.  The history is provided by the patient, the spouse and medical records. No language interpreter was used.  Abdominal Pain    77 year old female significant history of Raynaud's, migraine, hyperlipidemia, hypertension presenting today for evaluation of abdominal pain.  History obtained through patient and through husband who is at bedside.  For the past 2 weeks patient has had recurrent abdominal pain.  She described pain as a sharp cramping sensation to her epigastric and right upper quadrant radiates to her back.  She endorsed pain that appears shortly after eating approximately 30 to 60 minutes after each food intake.  She endorsed nausea and has had vomiting without any blood.  Her last bowel movement was earlier this morning and a small amount.  She is able to pass flatus.  She is afraid of eating as it aggravates the pain.  She has dorsum chills.  She does not endorse any fever chest pain shortness of breath productive cough dysuria hematuria.  She denies heavy alcohol or tobacco use.  She has tried numerous over-the-counter medication such as PPI, and H2 blocker without adequate relief.  She did contact her doctor who recommend patient to come to ER for further assessment.  Currently her pain is mild.  Home Medications Prior to Admission medications   Medication Sig Start Date End Date Taking? Authorizing Provider  albuterol (PROAIR HFA) 108 (90 Base) MCG/ACT inhaler Inhale 1-2 puffs into the lungs every 6 (six) hours as needed for wheezing or shortness of breath. 08/06/21  Yes Shelva Majestic, MD  ALPRAZolam Prudy Feeler) 0.5 MG tablet Take 1 tablet (0.5 mg total) by mouth every 8 (eight) hours as needed for anxiety. Do not drive for 8 hours after use.  10/30/22  Yes Shelva Majestic, MD  amLODipine (NORVASC) 5 MG tablet Take 1 tablet (5 mg total) by mouth 2 (two) times daily. 06/16/22  Yes   aspirin 81 MG chewable tablet Chew 81 mg by mouth daily.   Yes [provider]  cilostazol (PLETAL) 100 MG tablet Take 1 tablet (100 mg total) by mouth 2 (two) times daily 06/16/22  Yes   cyanocobalamin 1000 MCG tablet Take 1,000 mcg by mouth daily.   Yes [provider]  EPINEPHrine 0.3 mg/0.3 mL IJ SOAJ injection Inject 0.3 mg into the muscle as needed for anaphylaxis. 03/15/20  Yes Shelva Majestic, MD  estradiol (CLIMARA - DOSED IN MG/24 HR) 0.0375 mg/24hr patch Place 1 patch (0.0375 mg total) onto the skin once a week. 06/17/21  Yes   FLUoxetine (PROZAC) 40 MG capsule Take 1 capsule (40 mg total) by mouth daily. 06/16/22  Yes   HYDROcodone-acetaminophen (NORCO) 10-325 MG tablet TAKE (1) TABLET EVERY FOUR TO SIX HOURS AS NEEDED. Do not drive for 6 hours after use. Patient taking differently: Take 1 tablet by mouth every 4 (four) hours as needed for moderate pain. 08/26/22  Yes Shelva Majestic, MD  isosorbide mononitrate (IMDUR) 60 MG 24 hr tablet Take 1 tablet (60 mg total) by mouth daily. 06/16/22  Yes   ondansetron (ZOFRAN-ODT) 4 MG disintegrating tablet Take 1 tablet (4 mg total) by mouth every 8 (eight) hours as needed for nausea or vomiting. 05/21/21  Yes Shelva Majestic,  MD  potassium chloride SA (K-DUR,KLOR-CON) 20 MEQ tablet TAKE 1 TABLET DAILY. Patient taking differently: Take 20 mEq by mouth daily. 02/13/15  Yes Yoo, Doe-Hyun R, DO  Rimegepant Sulfate (NURTEC) 75 MG TBDP Take 75 mg by mouth daily as needed. 03/19/21  Yes Shelva Majestic, MD  rosuvastatin (CRESTOR) 10 MG tablet Take 1 tablet (10 mg total) by mouth once daily 06/16/22  Yes   triamterene-hydrochlorothiazide (MAXZIDE-25) 37.5-25 MG tablet Take 1 tablet by mouth once daily 06/16/22  Yes       Allergies    Methylprednisolone and Prednisone    Review of Systems    Review of Systems  Gastrointestinal:  Positive for abdominal pain.  All other systems reviewed and are negative.   Physical Exam Updated Vital Signs BP 112/78 (BP Location: Right Arm)   Pulse 83   Temp 97.7 F (36.5 C) (Oral)   Resp 16   Ht 5\' 1"  (1.549 m)   Wt 63.5 kg   LMP 03/31/1977   SpO2 99%   BMI 26.45 kg/m  Physical Exam Vitals and nursing note reviewed.  Constitutional:      General: She is not in acute distress.    Appearance: She is well-developed.  HENT:     Head: Atraumatic.  Eyes:     Conjunctiva/sclera: Conjunctivae normal.  Cardiovascular:     Rate and Rhythm: Normal rate and regular rhythm.  Pulmonary:     Effort: Pulmonary effort is normal.     Breath sounds: No wheezing, rhonchi or rales.  Abdominal:     Palpations: Abdomen is soft.     Tenderness: There is generalized abdominal tenderness. There is no guarding or rebound. Negative signs include Murphy's sign and McBurney's sign.     Hernia: No hernia is present.  Musculoskeletal:     Cervical back: Neck supple.  Skin:    Findings: No rash.  Neurological:     Mental Status: She is alert.  Psychiatric:        Mood and Affect: Mood normal.     ED Results / Procedures / Treatments   Labs (all labs ordered are listed, but only abnormal results are displayed) Labs Reviewed  LIPASE, BLOOD - Abnormal; Notable for the following components:      Result Value   Lipase 52 (*)    All other components within normal limits  COMPREHENSIVE METABOLIC PANEL - Abnormal; Notable for the following components:   Potassium 3.4 (*)    Chloride 97 (*)    Glucose, Bld 109 (*)    Creatinine, Ser 1.18 (*)    Total Protein 8.3 (*)    AST 206 (*)    ALT 139 (*)    Alkaline Phosphatase 394 (*)    Total Bilirubin 3.0 (*)    GFR, Estimated 48 (*)    All other components within normal limits  CBC - Abnormal; Notable for the following components:   MCV 101.7 (*)    MCH 34.5 (*)    Platelets 403 (*)    All other  components within normal limits  URINALYSIS, ROUTINE W REFLEX MICROSCOPIC - Abnormal; Notable for the following components:   Color, Urine AMBER (*)    APPearance HAZY (*)    All other components within normal limits    EKG None  Radiology US Abdomen Limited  Result Date: 11/07/2022 CLINICAL DATA:  Pain right upper quadrant x2 weeks EXAM: ULTRASOUND ABDOMEN LIMITED RIGHT UPPER QUADRANT COMPARISON:  None Available. FINDINGS: Gallbladder: Echogenic structures are  seen in the dependent portion of the gallbladder suggesting sludge and stones. Technologist did not observe any tenderness over the gallbladder. There is no fluid around the gallbladder. Common bile duct: Diameter: 4 mm Liver: No focal lesion identified. Within normal limits in parenchymal echogenicity. Portal vein is patent on color Doppler imaging with normal direction of blood flow towards the liver. Other: None. IMPRESSION: Sludge and stones are seen in gallbladder. There are no signs of acute cholecystitis. There is no dilation of bile ducts. Electronically Signed   By: Ernie Avena M.D.   On: 11/07/2022 14:12    Procedures Procedures    Medications Ordered in ED Medications  HYDROcodone-acetaminophen (NORCO/VICODIN) 5-325 MG per tablet 1 tablet (has no administration in time range)  sodium chloride 0.9 % bolus 1,000 mL (1,000 mLs Intravenous New Bag/Given 11/07/22 1511)    ED Course/ Medical Decision Making/ A&P                                 Medical Decision Making Amount and/or Complexity of Data Reviewed Radiology: ordered.  Risk Prescription drug management. Decision regarding hospitalization.   BP 112/78 (BP Location: Right Arm)   Pulse 83   Temp 97.7 F (36.5 C) (Oral)   Resp 16   Ht 5\' 1"  (1.549 m)   Wt 63.5 kg   LMP 03/31/1977   SpO2 99%   BMI 26.45 kg/m   46:55 PM 77 year old female significant history of Raynaud's, migraine, hyperlipidemia, hypertension presenting today for evaluation of  abdominal pain.  History obtained through patient and through husband who is at bedside.  For the past 2 weeks patient has had recurrent abdominal pain.  She described pain as a sharp cramping sensation to her epigastric and right upper quadrant radiates to her back.  She endorsed pain that appears shortly after eating approximately 30 to 60 minutes after each food intake.  She endorsed nausea and has had vomiting without any blood.  Her last bowel movement was earlier this morning and a small amount.  She is able to pass flatus.  She is afraid of eating as it aggravates the pain.  She has dorsum chills.  She does not endorse any fever chest pain shortness of breath productive cough dysuria hematuria.  She denies heavy alcohol or tobacco use.  She has tried numerous over-the-counter medication such as PPI, and H2 blocker without adequate relief.  She did contact her doctor who recommend patient to come to ER for further assessment.  Currently her pain is mild.  On exam this is a well-appearing female resting comfortably in bed appears to be in no acute discomfort.  Heart with normal rate and rhythm, lungs are clear to auscultation bilaterally abdomen is soft with some mild generalized abdominal tenderness.  More tenderness noted to epigastric and right upper quadrant without any guarding or rebound tenderness.  No CVA tenderness.  Bowel sounds present.  Vital sign review and overall reassuring she is afebrile no hypoxia.  -Labs ordered, independently viewed and interpreted by me.  Labs remarkable for transaminitis with AST 206, ALT 139, alk phos 394, total bili of 3.0 concerning for biliary obstruction.  Cr 1.18, similar to prior.  WBC normal. -The patient was maintained on a cardiac monitor.  I personally viewed and interpreted the cardiac monitored which showed an underlying rhythm of: NSR -Imaging independently viewed and interpreted by me and I agree with radiologist's interpretation.  Result remarkable  for limited abd US showing biliary sludge and stone without acute cholecystitis. -This patient presents to the ED for concern of post prandial pain, this involves an extensive number of treatment options, and is a complaint that carries with it a high risk of complications and morbidity.  The differential diagnosis includes acute cholecystitis, choledocholithiasis, gastritis, GERD, PUD, malignancy, hepatitis, SBO -Co morbidities that complicate the patient evaluation includes cholelithiasis, HTN, anxiety, CKD -Treatment includes IVF, morphine, zofran -Reevaluation of the patient after these medicines showed that the patient improved -PCP office notes or outside notes reviewed -Discussion with specialist [facility] who request medicine admission and abd/pelvis CT.  Anticipate pt may need MRCP. -Escalation to admission/observation considered: patient is agreeable with admission.   No history of Tylenol use or heavy alcohol use and no prior history of hepatitis.         Final Clinical Impression(s) / ED Diagnoses Final diagnoses:  Obstructive hyperbilirubinemia  Intractable nausea and vomiting    Rx / DC Orders ED Discharge Orders     None         Fayrene Helper, PA-C 11/07/22 1525    Vanetta Mulders, MD 11/07/22 1655

## 2022-11-07 NOTE — Progress Notes (Addendum)
Patient Name: Kari Welch Date of Encounter: 11/07/2022, 2:58 PM    Subjective   77 year old female with history of LFTs, hyperplastic colon polyps, internal hemorrhoids, migraines, peripheral vascular disease, Raynaud's with collagen vascular disease, alpha gal sent from the office with [name] today to the ER for epigastric discomfort with nausea vomiting and poor p.o. intake. Please see Jennifer's note for complete HPI.  Father with history of GB disease with sepsis.  Patient's had intermittent epigastric/right upper quadrant abdominal discomfort for years  since potentially 2010 however has decreased considerably up into the last 2 weeks where she has had at least 6-7 episodes of abdominal pain.  She will have something to eat 30 to 60 minutes later epigastric discomfort radiates around to her back tightness will last about 1 to 2 hours with vomiting. Normally this goes away however last night patient ate eggplant Parmesan and afterwards had significant epigastric pain, nausea vomiting and this continued intermittently until 530 this morning. No jaundice, normal urine, bowel movements unchanged.  Denies melena or hematochezia. Has had some chills intermittently between episodes and with episodes but denies fevers.  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 11/13/2012 colonoscopy normal.  Repeat recommended in 5 years given family history of colon cancer.  Mother with colon cancer.   Objective  BP 112/78 (BP Location: Right Arm)   Pulse 83   Temp 97.7 F (36.5 C) (Oral)   Resp 16   Ht 5\' 1"  (1.549 m)   Wt 63.5 kg   LMP 03/31/1977   SpO2 99%   BMI 26.45 kg/m   Physical Exam Constitutional:      Appearance: She is well-developed.  HENT:     Head: Normocephalic and atraumatic.     Right Ear: External ear normal.     Left Ear: External ear  normal.     Mouth/Throat:     Mouth: Oropharynx is clear and moist.  Eyes:     Extraocular Movements: EOM normal.     Conjunctiva/sclera: Conjunctivae normal.     Pupils: Pupils are equal, round, and reactive to light.  Neck:     Thyroid: No thyromegaly.  Cardiovascular:     Rate and Rhythm: Normal rate and regular rhythm.     Heart sounds: Normal heart sounds. No murmur heard.    No friction rub. No gallop.  Pulmonary:     Effort: Pulmonary effort is normal. No respiratory distress.     Breath sounds: Normal breath sounds. No wheezing.  Abdominal:     General: Bowel sounds are decreased. There is no distension or abdominal bruit.     Palpations: Abdomen is soft. Abdomen is not rigid. There is no shifting dullness, mass or pulsatile mass.     Tenderness: There is abdominal tenderness in the right upper quadrant and epigastric area. There is guarding. There is no CVA tenderness or rebound. Negative signs include Murphy's sign and McBurney's sign.     Hernia: No hernia is present.  Musculoskeletal:        General: Normal range of motion.     Cervical back: Normal range of motion and neck supple.  Lymphadenopathy:     Cervical: No cervical adenopathy.  Skin:    General: Skin is warm and dry.  Neurological:     Mental Status: She is alert and oriented to person, place, and time.  Psychiatric:  Mood and Affect: Mood and affect normal.      Assessment and Plan   RUQ [place] with associated nausea and vomiting 2 weeks of poor p.o. intake WBC 9.6 HGB 14.3 Platelets 403 AST 206 ALT 139  Alkphos 394 TBili 3.0 [place]: Sludge and stones are seen in gallbladder. There are no signs of acute cholecystitis. There is no dilation of bile ducts. Elevated total bili, alk phos, concern for potential chronic cholecystitis versus ruling out choledocholithiasis No leukocytosis, no fevers, no need for antibiotics at this time.  Pending CT results. If patient has choledocholithiasis we will  proceed with ERCP tomorrow. If CT shows no choledocholithiasis, suggest proceeding with MRCP. If MRCP is unremarkable would suggest consulting our general surgery team for evaluation for cholecystectomy with IOC.  - Daily CBC, CMET -Continue supportive care -Continue pain control. -Continue IV hydration, can do clear liquids today, n.p.o. at midnight. -I thoroughly discussed procedure with the patient to include nature, alternatives, benefits, and risks (including but not limited to post ERCP pancreatitis, bleeding, infection, perforation, anesthesia/cardiac pulmonary complications). Discussed risk of pancreatitis associated with ERCP. Patient verbalized understanding and gave verbal consent to proceed with ERCP. -Patient will benefit from surgical consult this admission

## 2022-11-07 NOTE — ED Notes (Signed)
The doctors are still with patient discussing care.

## 2022-11-07 NOTE — Progress Notes (Signed)
Chief Complaint: Epigastric pain, nausea and vomiting  HPI:    Kari Welch is a 77 year old female with a past medical history as listed below, who was referred to me by Shelva Majestic, MD for a complaint of epigastric pain, nausea and vomiting.    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    11/13/2012 colonoscopy normal.  Repeat recommended in 5 years given family history of colon cancer.  Mother with colon cancer.   4 /9/24 CT chest without contrast for persistent cough/shortness of breath with emphysema.  Scattered pulmonary nodules.  Also cholelithiasis.    07/16/2022 CMP and CBC normal.  TSH elevated 6.44.    Today, patient presents to clinic accompanied by her husband who is a retired Development worker, community.  They explain that patient has had occasional episodes of the same symptoms off-and-on over the past many years, previously had an EGD back in 2010 and there was question of possible pancreatitis by Dr. Juanda Chance but this was never confirmed.  They describe that her last episode of the symptoms was about a year ago and now most recently over the past 2 weeks or so she has been in a flare of the symptoms continuously which is very abnormal.  They explained that within 30 to 60 minutes after she eats anything especially solid food she will get nauseous and have pain in her epigastrium that radiates straight through to her back and she tells me it feels like a band or a belt is then tightened around that part of her stomach extremely.  She then gets nauseous and will vomit and with previous episodes this completely relieves her pain until she eats again.  Just last night though she did have more to eat a little eggplant Parmesan and a small glass of wine and about an hour later the pain hit but did not go away she had 3-5 episodes of vomiting throughout the night and her pain is slightly  better at this moment in time, but she feels weak and fatigue and her husband is worried because she has not really eaten in 2 weeks.  He thinks she needs to go to the ER.  Associated symptoms include a couple episodes of chills.  No documented fever.    Denies fever or change in bowel movements other than a decrease in size.  Past Medical History:  Diagnosis Date   Abnormal Pap smear    h/o   Allergy    seasonal   Arthritis    Collagen vascular disease (HCC)    Elevated LFTs    Work up negative - Dr. Juanda Chance   Hyperlipidemia    Hyperplastic colon polyp    Hypertension    Internal hemorrhoids    INTERNAL HEMORRHOIDS 01/24/2009   Qualifier: Diagnosis of  By: Candice Camp CMA (AAMA), Dottie     Migraine    Prothrombin gene mutation (HCC)    heterozygous   PVD (peripheral vascular disease) (HCC)    takes Pletal to open blood flow of blood to fingers, toes, and lower extremities   Raynaud's disease    Vasculitis (HCC)    Venous insufficiency    superficial left leg- seeing a vein specialist in August    Past Surgical History:  Procedure Laterality Date   ABDOMINAL HYSTERECTOMY  1979   TAH/LSO   ABLATION SAPHENOUS VEIN W/ RFA     BREAST BIOPSY Left  10/27/2022   MM LT BREAST BX W LOC DEV 1ST LESION IMAGE BX SPEC STEREO GUIDE 10/27/2022 [facility]   BUNIONECTOMY WITH HAMMERTOE RECONSTRUCTION Left 10/13/2013   Procedure: LEFT FIRST METATARSAL SCARF OSTEOTOMY;  LEFT MODIFIED MCBRIDE BUNIONECTOMY AND SECOND HAMMERTOE CORRECTION;  Surgeon: Toni Arthurs, MD;  Location: [facility];  Service: Orthopedics;  Laterality: Left;   CATARACT EXTRACTION Bilateral 09/2014   Dr. Vonna Kotyk   CERVIX LESION DESTRUCTION  1970   COLONOSCOPY     COLONOSCOPY     DILATION AND CURETTAGE OF UTERUS     LASER ABLATION Bilateral    leg-left 8/14, right 12/14   MASS EXCISION Left    hand   osteopenia of hip  2017   T score -1.1   PELVIC LAPAROSCOPY     d/t infertility   right bartholin's  gland cyst excision Right    1990s   sympthectomy Left 2008   hand ([facility])    Current Outpatient Medications  Medication Sig Dispense Refill   albuterol (PROAIR HFA) 108 (90 Base) MCG/ACT inhaler Inhale 1-2 puffs into the lungs every 6 (six) hours as needed for wheezing or shortness of breath. 18 g 0   ALPRAZolam (XANAX) 0.5 MG tablet Take 1 tablet (0.5 mg total) by mouth every 8 (eight) hours as needed for anxiety. Do not drive for 8 hours after use. 30 tablet 0   amLODipine (NORVASC) 5 MG tablet Take 1 tablet (5 mg total) by mouth 2 (two) times daily. 180 tablet 3   aspirin 81 MG chewable tablet Chew 81 mg by mouth daily.     cilostazol (PLETAL) 100 MG tablet Take 1 tablet (100 mg total) by mouth 2 (two) times daily 180 tablet 3   cyanocobalamin 1000 MCG tablet Take 1,000 mcg by mouth daily.     EPINEPHrine 0.3 mg/0.3 mL IJ SOAJ injection Inject 0.3 mg into the muscle as needed for anaphylaxis. 2 each 0   estradiol (CLIMARA - DOSED IN MG/24 HR) 0.0375 mg/24hr patch Place 1 patch (0.0375 mg total) onto the skin once a week. 12 patch 3   FLUoxetine (PROZAC) 40 MG capsule Take 1 capsule (40 mg total) by mouth daily. 90 capsule 3   HYDROcodone-acetaminophen (NORCO) 10-325 MG tablet TAKE (1) TABLET EVERY FOUR TO SIX HOURS AS NEEDED. Do not drive for 6 hours after use. 30 tablet 0   isosorbide mononitrate (IMDUR) 60 MG 24 hr tablet Take 1 tablet (60 mg total) by mouth daily. 90 tablet 3   ondansetron (ZOFRAN-ODT) 4 MG disintegrating tablet Take 1 tablet (4 mg total) by mouth every 8 (eight) hours as needed for nausea or vomiting. 20 tablet 0   potassium chloride SA (K-DUR,KLOR-CON) 20 MEQ tablet TAKE 1 TABLET DAILY. 30 tablet 0   Rimegepant Sulfate (NURTEC) 75 MG TBDP Take 75 mg by mouth daily as needed. 8 tablet 5   rosuvastatin (CRESTOR) 10 MG tablet Take 1 tablet (10 mg total) by mouth once daily 90 tablet 3   triamterene-hydrochlorothiazide (MAXZIDE-25) 37.5-25 MG tablet Take 1 tablet by mouth  once daily 90 tablet 3   No current facility-administered medications for this visit.    Allergies as of 11/07/2022 - Review Complete 11/07/2022  Allergen Reaction Noted   Methylprednisolone  06/04/2021   Prednisone Swelling     Family History  Problem Relation Age of Onset   Stroke Mother    Colon cancer Mother 4       dec with colon ca  Heart attack Mother    Migraines Mother    Stroke Father    Heart attack Father    Gallbladder disease Father 86       septic gallbladder   Rheum arthritis Brother        dec age 58 ?Rheumatoid arthritis   Diabetes Maternal Grandmother    Esophageal cancer Neg Hx    Stomach cancer Neg Hx    Rectal cancer Neg Hx     Social History   Socioeconomic History   Marital status: Married    Spouse name: Not on file   Number of children: Not on file   Years of education: Not on file   Highest education level: Not on file  Occupational History   Not on file  Tobacco Use   Smoking status: Former    Current packs/day: 0.00    Average packs/day: 0.5 packs/day for 18.0 years (9.0 ttl pk-yrs)    Types: Cigarettes    Start date: 10/11/1970    Quit date: 10/10/1988    Years since quitting: 34.0   Smokeless tobacco: Never  Substance and Sexual Activity   Alcohol use: Yes    Alcohol/week: 2.0 standard drinks of alcohol    Types: 2 Standard drinks or equivalent per week    Comment: occ glass of wine   Drug use: No   Sexual activity: Yes    Partners: Male    Birth control/protection: Surgical    Comment: TAH  Other Topics Concern   Not on file  Social History Narrative   Married 28 years   3 adopted children   Social Determinants of Health   Financial Resource Strain: Not on file  Food Insecurity: Not on file  Transportation Needs: Not on file  Physical Activity: Not on file  Stress: Not on file  Social Connections: Not on file  Intimate Partner Violence: Not on file    Review of Systems:    Constitutional: No fever Skin: No  rash  Cardiovascular: No chest pain Respiratory: No SOB Gastrointestinal: See HPI and otherwise negative Genitourinary: No dysuria  Neurological: No headache, dizziness or syncope Musculoskeletal: No new muscle or joint pain Hematologic: No bleeding Psychiatric: No history of depression or anxiety   Physical Exam:  Vital signs: BP 90/60   Pulse 90   Ht 5\' 1"  (1.549 m)   Wt 140 lb (63.5 kg)   LMP 03/31/1977   BMI 26.45 kg/m   Constitutional:   Pleasant Caucasian female appears to be in NAD, Well developed, Well nourished, alert and cooperative Head:  Normocephalic and atraumatic. Eyes:   PEERL, EOMI. No icterus. Conjunctiva pink. Ears:  Normal auditory acuity. Neck:  Supple Throat: Oral cavity and pharynx without inflammation, swelling or lesion.  Respiratory: Respirations even and unlabored. Lungs clear to auscultation bilaterally.   No wheezes, crackles, or rhonchi.  Cardiovascular: Normal S1, S2. No MRG. Regular rate and rhythm. No peripheral edema, cyanosis or pallor.  Gastrointestinal:  Soft, nondistended, nontender. No rebound or guarding. Normal bowel sounds. No appreciable masses or hepatomegaly. Rectal:  Not performed.  Msk:  Symmetrical without gross deformities. Without edema, no deformity or joint abnormality.  Neurologic:  Alert and  oriented x4;  grossly normal neurologically.  Skin:   Dry and intact without significant lesions or rashes. Psychiatric: Demonstrates good judgement and reason without abnormal affect or behaviors.  RELEVANT LABS AND IMAGING: CBC    Component Value Date/Time   WBC 5.3 07/16/2022 1000   RBC 4.04 07/16/2022 1000  HGB 14.1 07/16/2022 1000   HCT 41.7 07/16/2022 1000   PLT 354.0 07/16/2022 1000   MCV 103.2 (H) 07/16/2022 1000   MCH 35.4 (H) 03/15/2020 1155   MCHC 33.9 07/16/2022 1000   RDW 14.1 07/16/2022 1000   LYMPHSABS 2.1 07/16/2022 1000   MONOABS 0.5 07/16/2022 1000   EOSABS 0.2 07/16/2022 1000   BASOSABS 0.0 07/16/2022 1000     CMP     Component Value Date/Time   NA 140 07/16/2022 1000   K 3.7 07/16/2022 1000   CL 104 07/16/2022 1000   CO2 24 07/16/2022 1000   GLUCOSE 96 07/16/2022 1000   GLUCOSE 83 01/27/2006 1158   BUN 22 07/16/2022 1000   CREATININE 1.18 07/16/2022 1000   CREATININE 1.05 (H) 03/15/2020 1155   CALCIUM 9.3 07/16/2022 1000   PROT 7.0 07/16/2022 1000   ALBUMIN 4.0 07/16/2022 1000   AST 19 07/16/2022 1000   ALT 12 07/16/2022 1000   ALKPHOS 43 07/16/2022 1000   BILITOT 0.3 07/16/2022 1000   GFRNONAA 53 (L) 03/15/2020 1155   GFRAA 61 03/15/2020 1155    Assessment: 1.  Epigastric/right upper quadrant pain: Long history of some similar episodes, no direct diagnosis, did have a CT of the chest in April showing some gallstones, query if this is symptomatic cholelithiasis +/- pancreatitis versus more of a gastric outlet obstruction picture 2.  Nausea and vomiting: With episodes above  Plan: 1.  Discussed possible outpatient workup with the patient and her husband but this would at least take over the next week or so.  He is worried that she has not eaten in the past 2 weeks and has continued pain and this episode has lasted so long that she needs to go to the ER.  They are going to go to [place] long.  I have alerted our inpatient team there.  Please consult them if necessary, Dr. Meridee Score and Quentin Mulling, PA-C. 2.  Patient will likely need a right upper quadrant ultrasound as well as labs including CBC, CMP and lipase initially.  May need to complete CT and/or EGD pending results. 3.  Also recommend empiric PPI Pantoprazole 40 mg IV twice daily 4.  Patient to follow in clinic per recommendations after being seen in the ER.  [name], PA-C [facility] 11/07/2022, 10:34 AM  Cc: Shelva Majestic, MD

## 2022-11-07 NOTE — H&P (View-Only) (Signed)
Patient Name: Kari Welch Date of Encounter: 11/07/2022, 2:58 PM    Subjective   77 year old female with history of LFTs, hyperplastic colon polyps, internal hemorrhoids, migraines, peripheral vascular disease, Raynaud's with collagen vascular disease, alpha gal sent from the office with [name] today to the ER for epigastric discomfort with nausea vomiting and poor p.o. intake. Please see Jennifer's note for complete HPI.  Father with history of GB disease with sepsis.  Patient's had intermittent epigastric/right upper quadrant abdominal discomfort for years  since potentially 2010 however has decreased considerably up into the last 2 weeks where she has had at least 6-7 episodes of abdominal pain.  She will have something to eat 30 to 60 minutes later epigastric discomfort radiates around to her back tightness will last about 1 to 2 hours with vomiting. Normally this goes away however last night patient ate eggplant Parmesan and afterwards had significant epigastric pain, nausea vomiting and this continued intermittently until 530 this morning. No jaundice, normal urine, bowel movements unchanged.  Denies melena or hematochezia. Has had some chills intermittently between episodes and with episodes but denies fevers.  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 11/13/2012 colonoscopy normal.  Repeat recommended in 5 years given family history of colon cancer.  Mother with colon cancer.   Objective  BP 112/78 (BP Location: Right Arm)   Pulse 83   Temp 97.7 F (36.5 C) (Oral)   Resp 16   Ht 5\' 1"  (1.549 m)   Wt 63.5 kg   LMP 03/31/1977   SpO2 99%   BMI 26.45 kg/m   Physical Exam Constitutional:      Appearance: She is well-developed.  HENT:     Head: Normocephalic and atraumatic.     Right Ear: External ear normal.     Left Ear: External ear  normal.     Mouth/Throat:     Mouth: Oropharynx is clear and moist.  Eyes:     Extraocular Movements: EOM normal.     Conjunctiva/sclera: Conjunctivae normal.     Pupils: Pupils are equal, round, and reactive to light.  Neck:     Thyroid: No thyromegaly.  Cardiovascular:     Rate and Rhythm: Normal rate and regular rhythm.     Heart sounds: Normal heart sounds. No murmur heard.    No friction rub. No gallop.  Pulmonary:     Effort: Pulmonary effort is normal. No respiratory distress.     Breath sounds: Normal breath sounds. No wheezing.  Abdominal:     General: Bowel sounds are decreased. There is no distension or abdominal bruit.     Palpations: Abdomen is soft. Abdomen is not rigid. There is no shifting dullness, mass or pulsatile mass.     Tenderness: There is abdominal tenderness in the right upper quadrant and epigastric area. There is guarding. There is no CVA tenderness or rebound. Negative signs include Murphy's sign and McBurney's sign.     Hernia: No hernia is present.  Musculoskeletal:        General: Normal range of motion.     Cervical back: Normal range of motion and neck supple.  Lymphadenopathy:     Cervical: No cervical adenopathy.  Skin:    General: Skin is warm and dry.  Neurological:     Mental Status: She is alert and oriented to person, place, and time.  Psychiatric:  Mood and Affect: Mood and affect normal.      Assessment and Plan   RUQ [place] with associated nausea and vomiting 2 weeks of poor p.o. intake WBC 9.6 HGB 14.3 Platelets 403 AST 206 ALT 139  Alkphos 394 TBili 3.0 [place]: Sludge and stones are seen in gallbladder. There are no signs of acute cholecystitis. There is no dilation of bile ducts. Elevated total bili, alk phos, concern for potential chronic cholecystitis versus ruling out choledocholithiasis No leukocytosis, no fevers, no need for antibiotics at this time.  Pending CT results. If patient has choledocholithiasis we will  proceed with ERCP tomorrow. If CT shows no choledocholithiasis, suggest proceeding with MRCP. If MRCP is unremarkable would suggest consulting our general surgery team for evaluation for cholecystectomy with IOC.  - Daily CBC, CMET -Continue supportive care -Continue pain control. -Continue IV hydration, can do clear liquids today, n.p.o. at midnight. -I thoroughly discussed procedure with the patient to include nature, alternatives, benefits, and risks (including but not limited to post ERCP pancreatitis, bleeding, infection, perforation, anesthesia/cardiac pulmonary complications). Discussed risk of pancreatitis associated with ERCP. Patient verbalized understanding and gave verbal consent to proceed with ERCP. -Patient will benefit from surgical consult this admission

## 2022-11-07 NOTE — ED Notes (Signed)
ED TO INPATIENT HANDOFF REPORT  ED Nurse Name and Phone #: Mellody Dance  [phone]   S Name/Age/Gender Kari Welch 77 y.o. female Room/Bed: WHALC/WHALC  Code Status   Code Status: Not on file  Home/SNF/Other Home Patient oriented to: self, place, time, and situation Is this baseline? Yes   Triage Complete: Triage complete  Chief Complaint Obstructive hyperbilirubinemia [K83.1]  Triage Note Pt reports episodes of abdominal pain, nausea and vomiting 60 minutes after eating x 2 weeks    Allergies Allergies  Allergen Reactions   Methylprednisolone     With IM injection- Panic attacks, trouble with concentration, hyperactive, hyperventilation   Prednisone Swelling    short course=swelling    Level of Care/Admitting Diagnosis ED Disposition     ED Disposition  Admit   Condition  --   Comment  Hospital [facility]: [facility] [[id number]]  Level of Care: Med-Surg [16]  May admit patient to [facility] or [facility] if equivalent level of care is available:: No  Covid Evaluation: Asymptomatic - no recent exposure (last 10 days) testing not required  Diagnosis: Obstructive hyperbilirubinemia [226990]  Admitting Physician: Bobette Mo [[id number]]  Attending Physician: Bobette Mo [[id number]]  Certification:: I certify this patient will need inpatient services for at least 2 midnights  Estimated Length of Stay: 2          B Medical/Surgery History Past Medical History:  Diagnosis Date   Abnormal Pap smear    h/o   Allergy    seasonal   Arthritis    Collagen vascular disease (HCC)    Elevated LFTs    Work up negative - Dr. Juanda Chance   Hyperlipidemia    Hyperplastic colon polyp    Hypertension    Internal hemorrhoids    INTERNAL HEMORRHOIDS 01/24/2009   Qualifier: Diagnosis of  By: Candice Camp CMA (AAMA), Dottie     Migraine    Prothrombin gene mutation (HCC)    heterozygous   PVD (peripheral vascular disease) (HCC)    takes  Pletal to open blood flow of blood to fingers, toes, and lower extremities   Raynaud's disease    Vasculitis (HCC)    Venous insufficiency    superficial left leg- seeing a vein specialist in August   Past Surgical History:  Procedure Laterality Date   ABDOMINAL HYSTERECTOMY  1979   TAH/LSO   ABLATION SAPHENOUS VEIN W/ RFA     BREAST BIOPSY Left 10/27/2022   MM LT BREAST BX W LOC DEV 1ST LESION IMAGE BX SPEC STEREO GUIDE 10/27/2022 [facility]   BUNIONECTOMY WITH HAMMERTOE RECONSTRUCTION Left 10/13/2013   Procedure: LEFT FIRST METATARSAL SCARF OSTEOTOMY;  LEFT MODIFIED MCBRIDE BUNIONECTOMY AND SECOND HAMMERTOE CORRECTION;  Surgeon: Toni Arthurs, MD;  Location: [facility];  Service: Orthopedics;  Laterality: Left;   CATARACT EXTRACTION Bilateral 09/2014   Dr. Vonna Kotyk   CERVIX LESION DESTRUCTION  1970   COLONOSCOPY     COLONOSCOPY     DILATION AND CURETTAGE OF UTERUS     LASER ABLATION Bilateral    leg-left 8/14, right 12/14   MASS EXCISION Left    hand   osteopenia of hip  2017   T score -1.1   PELVIC LAPAROSCOPY     d/t infertility   right bartholin's gland cyst excision Right    1990s   sympthectomy Left 2008   hand ([facility])     A IV Location/Drains/Wounds Patient Lines/Drains/Airways Status     Active Line/Drains/Airways  Name Placement date Placement time Site Days   Peripheral IV 11/07/22 20 G Left Antecubital 11/07/22  1509  Antecubital  less than 1            Intake/Output Last 24 hours No intake or output data in the 24 hours ending 11/07/22 1536  Labs/Imaging Results for orders placed or performed during the hospital encounter of 11/07/22 (from the past 48 hour(s))  Lipase, blood     Status: Abnormal   Collection Time: 11/07/22 11:48 AM  Result Value Ref Range   Lipase 52 (H) 11 - 51 U/L    Comment: Performed at [facility], [address] [place], [place] [address]  Comprehensive metabolic panel     Status:  Abnormal   Collection Time: 11/07/22 11:48 AM  Result Value Ref Range   Sodium 135 135 - 145 mmol/L   Potassium 3.4 (L) 3.5 - 5.1 mmol/L   Chloride 97 (L) 98 - 111 mmol/L   CO2 24 22 - 32 mmol/L   Glucose, Bld 109 (H) 70 - 99 mg/dL    Comment: Glucose reference range applies only to samples taken after fasting for at least 8 hours.   BUN 15 8 - 23 mg/dL   Creatinine, Ser 7.42 (H) 0.44 - 1.00 mg/dL   Calcium 9.4 8.9 - 59.5 mg/dL   Total Protein 8.3 (H) 6.5 - 8.1 g/dL   Albumin 3.6 3.5 - 5.0 g/dL   AST 638 (H) 15 - 41 U/L   ALT 139 (H) 0 - 44 U/L   Alkaline Phosphatase 394 (H) 38 - 126 U/L   Total Bilirubin 3.0 (H) 0.3 - 1.2 mg/dL   GFR, Estimated 48 (L) >60 mL/min    Comment: (NOTE) Calculated using the CKD-EPI Creatinine Equation (2021)    Anion gap 14 5 - 15    Comment: Performed at [facility], [address] [place], [place] [address]  CBC     Status: Abnormal   Collection Time: 11/07/22 11:48 AM  Result Value Ref Range   WBC 9.6 4.0 - 10.5 K/uL   RBC 4.15 3.87 - 5.11 MIL/uL   Hemoglobin 14.3 12.0 - 15.0 g/dL   HCT 32.9 51.8 - 84.1 %   MCV 101.7 (H) 80.0 - 100.0 fL   MCH 34.5 (H) 26.0 - 34.0 pg   MCHC 33.9 30.0 - 36.0 g/dL   RDW 66.0 63.0 - 16.0 %   Platelets 403 (H) 150 - 400 K/uL   nRBC 0.0 0.0 - 0.2 %    Comment: Performed at [facility], [address] [place], [place] [address]  Urinalysis, Routine w reflex microscopic -Urine, Clean Catch     Status: Abnormal   Collection Time: 11/07/22 11:48 AM  Result Value Ref Range   Color, Urine AMBER (A) YELLOW    Comment: BIOCHEMICALS MAY BE AFFECTED BY COLOR   APPearance HAZY (A) CLEAR   Specific Gravity, Urine 1.011 1.005 - 1.030   pH 6.0 5.0 - 8.0   Glucose, UA NEGATIVE NEGATIVE mg/dL   Hgb urine dipstick NEGATIVE NEGATIVE   Bilirubin Urine NEGATIVE NEGATIVE   Ketones, ur NEGATIVE NEGATIVE mg/dL   Protein, ur NEGATIVE NEGATIVE mg/dL   Nitrite NEGATIVE NEGATIVE    Leukocytes,Ua NEGATIVE NEGATIVE    Comment: Performed at [facility], [address] [place], [place] [address]   US Abdomen Limited  Result Date: 11/07/2022 CLINICAL DATA:  Pain right upper quadrant x2 weeks EXAM: ULTRASOUND  ABDOMEN LIMITED RIGHT UPPER QUADRANT COMPARISON:  None Available. FINDINGS: Gallbladder: Echogenic structures are seen in the dependent portion of the gallbladder suggesting sludge and stones. Technologist did not observe any tenderness over the gallbladder. There is no fluid around the gallbladder. Common bile duct: Diameter: 4 mm Liver: No focal lesion identified. Within normal limits in parenchymal echogenicity. Portal vein is patent on color Doppler imaging with normal direction of blood flow towards the liver. Other: None. IMPRESSION: Sludge and stones are seen in gallbladder. There are no signs of acute cholecystitis. There is no dilation of bile ducts. Electronically Signed   By: Ernie Avena M.D.   On: 11/07/2022 14:12    Pending Labs Unresulted Labs (From admission, onward)    None       Vitals/Pain Today's Vitals   11/07/22 1122 11/07/22 1146  BP: 112/78   Pulse: 83   Resp: 16   Temp: 97.7 F (36.5 C)   TempSrc: Oral   SpO2: 99%   Weight:  63.5 kg  Height:  5\' 1"  (1.549 m)  PainSc:  0-No pain    Isolation Precautions No active isolations  Medications Medications  HYDROcodone-acetaminophen (NORCO/VICODIN) 5-325 MG per tablet 1 tablet (has no administration in time range)  iohexol (OMNIPAQUE) 300 MG/ML solution 100 mL (has no administration in time range)  sodium chloride 0.9 % bolus 1,000 mL (1,000 mLs Intravenous New Bag/Given 11/07/22 1511)    Mobility walks     Focused Assessments    R Recommendations: See Admitting Provider Note  Report given to:   Additional Notes:

## 2022-11-07 NOTE — H&P (Signed)
History and Physical    Patient: Kari Welch FIE:[id number] DOB: [birth year] DOA: 11/07/2022 DOS: the patient was seen and examined on 11/07/2022 PCP: Shelva Majestic, MD  Patient coming from: Home  Chief Complaint:  Chief Complaint  Patient presents with   Abdominal Pain   HPI: Kari Welch is a 77 y.o. female with medical history significant of abnormal Pap smear, seasonal allergies, osteoarthritis, collagen vascular disease, previously elevated LFTs with negative workup, hyperlipidemia, hyperplastic colon polyp, hypertensions, internal hemorrhoids, migraine headaches, head [place] goes prothrombin gene mutation, peripheral vascular disease, Raynaud's disease, vasculitis, venous insufficiency who presented to the emergency department complaints of abdominal pain, nausea and emesis roughly about an hour after she eats for the past 2 weeks.  The pain is in her epigastric/RUQ area radiated to her back and at times feels like a band going around them pressuring on her abdomen.  It is usually relieved by vomiting.  Until yesterday, she had not eaten much in the past 2 days and her symptoms felt a lot better.  She ate some chicken Parmigiana yesterday evening and subsequently had her symptoms reappear.  She denied fever, chills, rhinorrhea, sore throat, wheezing or hemoptysis.  No chest pain, palpitations, diaphoresis, PND, orthopnea or pitting edema of the lower extremities.  No abdominal pain, nausea, emesis, diarrhea, constipation, melena or hematochezia.  No flank pain, dysuria, frequency or hematuria.  No polyuria, polydipsia, polyphagia or blurred vision.   Lab work: Her urinalysis was hazy and amber in color but otherwise unremarkable.  CBC showed a white count of 9.6, hemoglobin 14.3 g/dL with an MCV of 166.0 fL and platelets 403.  Lipase is 52, AST 206, ALT 839 and alkaline phosphatase 394 units/L.  Total protein 8.3 and albumin 3.6 g/dL.  Total bilirubin 3.0, glucose 109, BUN 15 and  creatinine 1.18 mg/dL.  Potassium is 3.4 and chloride 97 mmol/L.  The rest of the electrolytes were normal.  Imaging: RUQ ultrasound shows sludge and stones in the gallbladder.  No signs of acute cholecystitis.  No dilatation of the bile ducts.   ED course: Initial vital signs were temperature 97.7 F, pulse 83, respirations 16, BP 112/70 mmHg O2 sat 99% on room air.  The patient received 1000 mL of normal saline bolus.  Review of Systems: As mentioned in the history of present illness. All other systems reviewed and are negative.  Past Medical History:  Diagnosis Date   Abnormal Pap smear    h/o   Allergy    seasonal   Arthritis    Collagen vascular disease (HCC)    Elevated LFTs    Work up negative - Dr. Juanda Chance   Hyperlipidemia    Hyperplastic colon polyp    Hypertension    Internal hemorrhoids    INTERNAL HEMORRHOIDS 01/24/2009   Qualifier: Diagnosis of  By: Candice Camp CMA (AAMA), Dottie     Migraine    Prothrombin gene mutation (HCC)    heterozygous   PVD (peripheral vascular disease) (HCC)    takes Pletal to open blood flow of blood to fingers, toes, and lower extremities   Raynaud's disease    Vasculitis (HCC)    Venous insufficiency    superficial left leg- seeing a vein specialist in August   Past Surgical History:  Procedure Laterality Date   ABDOMINAL HYSTERECTOMY  1979   TAH/LSO   ABLATION SAPHENOUS VEIN W/ RFA     BREAST BIOPSY Left 10/27/2022   MM LT BREAST BX W LOC DEV 1ST  LESION IMAGE BX SPEC STEREO GUIDE 10/27/2022 [facility]   BUNIONECTOMY WITH HAMMERTOE RECONSTRUCTION Left 10/13/2013   Procedure: LEFT FIRST METATARSAL SCARF OSTEOTOMY;  LEFT MODIFIED MCBRIDE BUNIONECTOMY AND SECOND HAMMERTOE CORRECTION;  Surgeon: Toni Arthurs, MD;  Location: [facility];  Service: Orthopedics;  Laterality: Left;   CATARACT EXTRACTION Bilateral 09/2014   Dr. Vonna Kotyk   CERVIX LESION DESTRUCTION  1970   COLONOSCOPY     COLONOSCOPY     DILATION AND  CURETTAGE OF UTERUS     LASER ABLATION Bilateral    leg-left 8/14, right 12/14   MASS EXCISION Left    hand   osteopenia of hip  2017   T score -1.1   PELVIC LAPAROSCOPY     d/t infertility   right bartholin's gland cyst excision Right    1990s   sympthectomy Left 2008   hand ([facility])   Social History:  reports that she quit smoking about 34 years ago. Her smoking use included cigarettes. She started smoking about 52 years ago. She has a 9 pack-year smoking history. She has never used smokeless tobacco. She reports current alcohol use of about 2.0 standard drinks of alcohol per week. She reports that she does not use drugs.  Allergies  Allergen Reactions   Methylprednisolone     With IM injection- Panic attacks, trouble with concentration, hyperactive, hyperventilation   Prednisone Swelling    short course=swelling    Family History  Problem Relation Age of Onset   Stroke Mother    Colon cancer Mother 68       dec with colon ca   Heart attack Mother    Migraines Mother    Stroke Father    Heart attack Father    Gallbladder disease Father 52       septic gallbladder   Rheum arthritis Brother        dec age 19 ?Rheumatoid arthritis   Diabetes Maternal Grandmother    Esophageal cancer Neg Hx    Stomach cancer Neg Hx    Rectal cancer Neg Hx     Prior to Admission medications   Medication Sig Start Date End Date Taking? Authorizing Provider  albuterol (PROAIR HFA) 108 (90 Base) MCG/ACT inhaler Inhale 1-2 puffs into the lungs every 6 (six) hours as needed for wheezing or shortness of breath. 08/06/21  Yes Shelva Majestic, MD  ALPRAZolam Prudy Feeler) 0.5 MG tablet Take 1 tablet (0.5 mg total) by mouth every 8 (eight) hours as needed for anxiety. Do not drive for 8 hours after use. 10/30/22  Yes Shelva Majestic, MD  amLODipine (NORVASC) 5 MG tablet Take 1 tablet (5 mg total) by mouth 2 (two) times daily. 06/16/22  Yes   aspirin 81 MG chewable tablet Chew 81 mg by mouth daily.   Yes  [provider]  cilostazol (PLETAL) 100 MG tablet Take 1 tablet (100 mg total) by mouth 2 (two) times daily 06/16/22  Yes   cyanocobalamin 1000 MCG tablet Take 1,000 mcg by mouth daily.   Yes [provider]  EPINEPHrine 0.3 mg/0.3 mL IJ SOAJ injection Inject 0.3 mg into the muscle as needed for anaphylaxis. 03/15/20  Yes Shelva Majestic, MD  estradiol (CLIMARA - DOSED IN MG/24 HR) 0.0375 mg/24hr patch Place 1 patch (0.0375 mg total) onto the skin once a week. 06/17/21  Yes   FLUoxetine (PROZAC) 40 MG capsule Take 1 capsule (40 mg total) by mouth daily. 06/16/22  Yes   HYDROcodone-acetaminophen (NORCO) 10-325  MG tablet TAKE (1) TABLET EVERY FOUR TO SIX HOURS AS NEEDED. Do not drive for 6 hours after use. Patient taking differently: Take 1 tablet by mouth every 4 (four) hours as needed for moderate pain. 08/26/22  Yes Shelva Majestic, MD  isosorbide mononitrate (IMDUR) 60 MG 24 hr tablet Take 1 tablet (60 mg total) by mouth daily. 06/16/22  Yes   ondansetron (ZOFRAN-ODT) 4 MG disintegrating tablet Take 1 tablet (4 mg total) by mouth every 8 (eight) hours as needed for nausea or vomiting. 05/21/21  Yes Shelva Majestic, MD  potassium chloride SA (K-DUR,KLOR-CON) 20 MEQ tablet TAKE 1 TABLET DAILY. Patient taking differently: Take 20 mEq by mouth daily. 02/13/15  Yes Yoo, Doe-Hyun R, DO  Rimegepant Sulfate (NURTEC) 75 MG TBDP Take 75 mg by mouth daily as needed. 03/19/21  Yes Shelva Majestic, MD  rosuvastatin (CRESTOR) 10 MG tablet Take 1 tablet (10 mg total) by mouth once daily 06/16/22  Yes   triamterene-hydrochlorothiazide (MAXZIDE-25) 37.5-25 MG tablet Take 1 tablet by mouth once daily 06/16/22  Yes     Physical Exam: Vitals:   11/07/22 1122 11/07/22 1146  BP: 112/78   Pulse: 83   Resp: 16   Temp: 97.7 F (36.5 C)   TempSrc: Oral   SpO2: 99%   Weight:  63.5 kg  Height:  5\' 1"  (1.549 m)   Physical Exam Vitals and nursing note reviewed.  Constitutional:      General:  She is awake. She is not in acute distress. HENT:     Head: Normocephalic.     Nose: No rhinorrhea.     Mouth/Throat:     Mouth: Mucous membranes are dry.  Eyes:     General: No scleral icterus.    Pupils: Pupils are equal, round, and reactive to light.  Neck:     Vascular: No JVD.  Cardiovascular:     Rate and Rhythm: Normal rate and regular rhythm.     Heart sounds: S1 normal and S2 normal.  Pulmonary:     Effort: Pulmonary effort is normal.     Breath sounds: Normal breath sounds. No wheezing, rhonchi or rales.  Abdominal:     General: Bowel sounds are normal.     Palpations: Abdomen is soft.     Tenderness: There is abdominal tenderness in the right upper quadrant and epigastric area. There is right CVA tenderness. There is no left CVA tenderness, guarding or rebound.  Musculoskeletal:     Cervical back: Neck supple.     Right lower leg: No edema.     Left lower leg: No edema.  Skin:    General: Skin is warm and dry.  Neurological:     General: No focal deficit present.     Mental Status: She is alert and oriented to person, place, and time.  Psychiatric:        Mood and Affect: Mood normal.        Behavior: Behavior normal. Behavior is cooperative.     Data Reviewed:  Results are pending, will review when available.  Assessment and Plan: Principal Problem:   Abnormal LFTs   Obstructive hyperbilirubinemia In the setting of:   Choledocholithiasis With associated:   Abdominal pain, chronic, right upper quadrant   Intractable nausea and vomiting Inpatient/MedSurg. Continue IV fluids. Clear liquid diet. Keep n.p.o. after midnight. Analgesics as needed. Antiemetics as needed. Pantoprazole 40 mg IVP daily. Follow CBC, CMP and lipase in AM. Gastroenterology consult appreciated. -For ERCP  in the morning.  Active Problems:   Hyperlipidemia Hold statin due to abnormal LFTs.    Essential hypertension Continue amlodipine 5 mg p.o. daily. Hold diuretic due to  volume depletion.    Intermittent claudication (HCC)   Paroxysmal digital cyanosis Continue cilostazol 100 mg p.o. twice daily.    CKD (chronic kidney disease), stage III (HCC) Monitor renal function and electrolytes.    Emphysema lung (HCC) Has not used her inhaler in a while. Short acting bronchodilators as needed.     Advance Care Planning:   Code Status: Full Code   Consults: Gastroenterology Jerrel Ivory MansouratyMD).  Family Communication: Her husband was at bedside.  Severity of Illness: The appropriate patient status for this patient is INPATIENT. Inpatient status is judged to be reasonable and necessary in order to provide the required intensity of service to ensure the patient's safety. The patient's presenting symptoms, physical exam findings, and initial radiographic and laboratory data in the context of their chronic comorbidities is felt to place them at high risk for further clinical deterioration. Furthermore, it is not anticipated that the patient will be medically stable for discharge from the hospital within 2 midnights of admission.   * I certify that at the point of admission it is my clinical judgment that the patient will require inpatient hospital care spanning beyond 2 midnights from the point of admission due to high intensity of service, high risk for further deterioration and high frequency of surveillance required.*  Author: Bobette Mo, MD 11/07/2022 3:17 PM  For on call review [email or web address].   This document was prepared using Dragon voice recognition software and may contain some unintended transcription errors.

## 2022-11-07 NOTE — ED Triage Notes (Signed)
Pt reports episodes of abdominal pain, nausea and vomiting 60 minutes after eating x 2 weeks

## 2022-11-08 ENCOUNTER — Encounter (HOSPITAL_COMMUNITY): Payer: Self-pay | Admitting: Internal Medicine

## 2022-11-08 ENCOUNTER — Encounter (HOSPITAL_COMMUNITY)
Admission: EM | Disposition: A | Discharge status: Home / Self Care | Payer: Self-pay | Source: Home / Self Care | Attending: Internal Medicine

## 2022-11-08 ENCOUNTER — Inpatient Hospital Stay (HOSPITAL_COMMUNITY): Payer: Medicare Other

## 2022-11-08 ENCOUNTER — Inpatient Hospital Stay (HOSPITAL_COMMUNITY): Payer: Medicare Other | Admitting: Anesthesiology

## 2022-11-08 DIAGNOSIS — Z9889 Other specified postprocedural states: Secondary | ICD-10-CM | POA: Diagnosis not present

## 2022-11-08 DIAGNOSIS — K8050 Calculus of bile duct without cholangitis or cholecystitis without obstruction: Secondary | ICD-10-CM | POA: Diagnosis not present

## 2022-11-08 DIAGNOSIS — K2950 Unspecified chronic gastritis without bleeding: Secondary | ICD-10-CM

## 2022-11-08 DIAGNOSIS — R1011 Right upper quadrant pain: Secondary | ICD-10-CM

## 2022-11-08 DIAGNOSIS — J440 Chronic obstructive pulmonary disease with acute lower respiratory infection: Secondary | ICD-10-CM

## 2022-11-08 DIAGNOSIS — K449 Diaphragmatic hernia without obstruction or gangrene: Secondary | ICD-10-CM | POA: Diagnosis not present

## 2022-11-08 DIAGNOSIS — I10 Essential (primary) hypertension: Secondary | ICD-10-CM | POA: Diagnosis not present

## 2022-11-08 DIAGNOSIS — R7989 Other specified abnormal findings of blood chemistry: Secondary | ICD-10-CM

## 2022-11-08 DIAGNOSIS — K838 Other specified diseases of biliary tract: Secondary | ICD-10-CM | POA: Diagnosis not present

## 2022-11-08 DIAGNOSIS — E785 Hyperlipidemia, unspecified: Secondary | ICD-10-CM

## 2022-11-08 DIAGNOSIS — G8929 Other chronic pain: Secondary | ICD-10-CM

## 2022-11-08 DIAGNOSIS — N1831 Chronic kidney disease, stage 3a: Secondary | ICD-10-CM | POA: Diagnosis not present

## 2022-11-08 DIAGNOSIS — K2289 Other specified disease of esophagus: Secondary | ICD-10-CM

## 2022-11-08 DIAGNOSIS — Z87891 Personal history of nicotine dependence: Secondary | ICD-10-CM

## 2022-11-08 DIAGNOSIS — J439 Emphysema, unspecified: Secondary | ICD-10-CM

## 2022-11-08 DIAGNOSIS — R112 Nausea with vomiting, unspecified: Secondary | ICD-10-CM

## 2022-11-08 DIAGNOSIS — I7300 Raynaud's syndrome without gangrene: Secondary | ICD-10-CM

## 2022-11-08 DIAGNOSIS — I739 Peripheral vascular disease, unspecified: Secondary | ICD-10-CM

## 2022-11-08 HISTORY — PX: REMOVAL OF STONES: SHX5545

## 2022-11-08 HISTORY — PX: ESOPHAGOGASTRODUODENOSCOPY: SHX5428

## 2022-11-08 HISTORY — PX: SPHINCTEROTOMY: SHX5544

## 2022-11-08 HISTORY — PX: BIOPSY: SHX5522

## 2022-11-08 HISTORY — PX: ERCP: SHX5425

## 2022-11-08 SURGERY — ERCP, WITH INTERVENTION IF INDICATED
Anesthesia: General

## 2022-11-08 MED ORDER — DICLOFENAC SUPPOSITORY 100 MG
RECTAL | Status: DC | PRN
  Administered 2022-11-08: 100 mg via RECTAL

## 2022-11-08 MED ORDER — CIPROFLOXACIN IN D5W 400 MG/200ML IV SOLN
INTRAVENOUS | Status: AC
  Filled 2022-11-08: qty 200

## 2022-11-08 MED ORDER — ONDANSETRON HCL 4 MG/2ML IJ SOLN
INTRAMUSCULAR | Status: DC | PRN
  Administered 2022-11-08: 4 mg via INTRAVENOUS

## 2022-11-08 MED ORDER — ALBUTEROL SULFATE HFA 108 (90 BASE) MCG/ACT IN AERS
INHALATION_SPRAY | RESPIRATORY_TRACT | Status: DC | PRN
  Administered 2022-11-08: 2 via RESPIRATORY_TRACT

## 2022-11-08 MED ORDER — POTASSIUM CHLORIDE IN NACL 20-0.9 MEQ/L-% IV SOLN
INTRAVENOUS | Status: AC
  Filled 2022-11-08: qty 1000

## 2022-11-08 MED ORDER — FENTANYL CITRATE (PF) 100 MCG/2ML IJ SOLN
INTRAMUSCULAR | Status: AC
  Filled 2022-11-08: qty 2

## 2022-11-08 MED ORDER — SUGAMMADEX SODIUM 200 MG/2ML IV SOLN
INTRAVENOUS | Status: DC | PRN
  Administered 2022-11-08: 200 mg via INTRAVENOUS

## 2022-11-08 MED ORDER — PHENYLEPHRINE 80 MCG/ML (10ML) SYRINGE FOR IV PUSH (FOR BLOOD PRESSURE SUPPORT)
PREFILLED_SYRINGE | INTRAVENOUS | Status: DC | PRN
  Administered 2022-11-08 (×5): 160 ug via INTRAVENOUS

## 2022-11-08 MED ORDER — LIDOCAINE 2% (20 MG/ML) 5 ML SYRINGE
INTRAMUSCULAR | Status: DC | PRN
  Administered 2022-11-08: 40 mg via INTRAVENOUS

## 2022-11-08 MED ORDER — CHLORHEXIDINE GLUCONATE CLOTH 2 % EX PADS
6.0000 | MEDICATED_PAD | Freq: Once | CUTANEOUS | Status: AC
  Administered 2022-11-08: 6 via TOPICAL

## 2022-11-08 MED ORDER — SODIUM CHLORIDE 0.9 % IV SOLN: INTRAVENOUS | Status: DC

## 2022-11-08 MED ORDER — LACTATED RINGERS IV SOLN
INTRAVENOUS | Status: AC | PRN
  Administered 2022-11-08: 1000 mL via INTRAVENOUS

## 2022-11-08 MED ORDER — GLUCAGON HCL RDNA (DIAGNOSTIC) 1 MG IJ SOLR
INTRAMUSCULAR | Status: AC
  Filled 2022-11-08: qty 1

## 2022-11-08 MED ORDER — ALBUTEROL SULFATE HFA 108 (90 BASE) MCG/ACT IN AERS
INHALATION_SPRAY | RESPIRATORY_TRACT | Status: AC
  Filled 2022-11-08: qty 6.7

## 2022-11-08 MED ORDER — SODIUM CHLORIDE 0.9 % IV SOLN
INTRAVENOUS | Status: DC | PRN
  Administered 2022-11-08: 15 mL

## 2022-11-08 MED ORDER — FENTANYL CITRATE (PF) 100 MCG/2ML IJ SOLN
INTRAMUSCULAR | Status: DC | PRN
  Administered 2022-11-08: 50 ug via INTRAVENOUS

## 2022-11-08 MED ORDER — CIPROFLOXACIN IN D5W 400 MG/200ML IV SOLN
INTRAVENOUS | Status: DC | PRN
Start: 2022-11-08 — End: 2022-11-08
  Administered 2022-11-08: 400 mg via INTRAVENOUS

## 2022-11-08 MED ORDER — ROCURONIUM BROMIDE 50 MG/5ML IV SOSY
PREFILLED_SYRINGE | INTRAVENOUS | Status: DC | PRN
  Administered 2022-11-08: 60 mg via INTRAVENOUS

## 2022-11-08 MED ORDER — DICLOFENAC SUPPOSITORY 100 MG
RECTAL | Status: AC
  Filled 2022-11-08: qty 1

## 2022-11-08 MED ORDER — PROPOFOL 10 MG/ML IV BOLUS
INTRAVENOUS | Status: AC
  Filled 2022-11-08: qty 20

## 2022-11-08 MED ORDER — PROPOFOL 10 MG/ML IV BOLUS
INTRAVENOUS | Status: DC | PRN
Start: 2022-11-08 — End: 2022-11-08
  Administered 2022-11-08: 140 mg via INTRAVENOUS

## 2022-11-08 MED ORDER — GLUCAGON HCL RDNA (DIAGNOSTIC) 1 MG IJ SOLR
INTRAMUSCULAR | Status: DC | PRN
  Administered 2022-11-08 (×2): .25 mg via INTRAVENOUS

## 2022-11-08 NOTE — Progress Notes (Signed)
PROGRESS NOTE    Kari Welch  NFA:[id number] DOB: Aug 02, 1945 DOA: 11/07/2022 PCP: Shelva Majestic, MD   Brief Narrative:  The patient is a 77 year old Caucasian female with past medical history significant for vomiting abnormal Pap smears, seasonal allergies, osteoarthritis, collagen vascular disease, previously elevated LFTs with negative workup, hyperlipidemia, hyperplastic colonic polyp, hypertension, internal hemorrhoids, migraine headaches, heterozygous prothrombin gene mutation, peripheral vascular disease, Raynaud's disease, vasculitis, venous insufficiency as well as other comorbidities who presented to the emergency room with complaints of abdominal discomfort, nausea and emesis that roughly started an hour after she eats for last few weeks.  The pain was in the epigastric right upper quadrant area that radiated to her back and felt like it was a "band going around" and then pressure in her abdomen.  She states that this abdominal discomfort was usually relieved by vomiting and to yesterday she had not eaten much in the last 2 days and subsequently her symptoms felt a lot better but she ate some chicken Parmesan yesterday evening and since her symptoms reappeared.  Given her symptoms she was brought to the ED and worked up and was found to have sludge and stones in the gallbladder with no acute signs of cholecystitis and no dilation of the bile duct.  She underwent CT scan imaging and was found to have elevated LFTs and showed a progressive biliary duct dilatation with some slight liver contour irregularity.  She underwent ERCP and was found to have choledocholithiasis and general surgery's been consulted for surgical intervention and plan is for cholecystectomy at some time tomorrow.  Assessment and Plan:  Abnormal LFTs Obstructive hyperbilirubinemia in the setting of choledocholithiasis Abdominal pain, chronic, right upper quadrant Intractable nausea and vomiting -She is admitted to  inpatient/MedSurg. -Continue IV fluids and resumed normal saline with 20 mill colons of KCl at 75 mL/h for another 20 hours -Clear liquid diet advance to heart healthy diet per gastroenterology will be made n.p.o. after midnight -Liver Function Trend: Recent Labs  Lab 11/07/22 1148 11/08/22 0538  AST 206* 91*  ALT 139* 96*  BILITOT 3.0* 1.1  ALKPHOS 394* 285*  -C/w Analgesics as needed with acetaminophen 650 mg p.o./RC every 6 as needed for mild pain or fever, oxycodone 5 mg every 4 as needed for moderate pain and IV hydromorphone 0.5 mg every 2 as needed severe pain -Continue with antiemetics as needed with ondansetron 4 mg p.o./IV every 6 as needed for nausea -Pantoprazole 40 mg IVP daily. -Follow CBC, CMP and lipase in AM. -Gastroenterology consult appreciated and she was taken for ERCP and was found to have choledocholithiasis that were completely removed by biliary center anatomy and balloon withdrawal -After the patient's ERCP, General Surgery was consulted and plans for surgical intervention with laparoscopic cholecystectomy sometime tomorrow a.m.   Hyperlipidemia -Hold statin due to abnormal LFTs.  Thrombocytosis  -Likely reactive in the setting of above -Platelet count trend: Recent Labs  Lab 11/07/22 1148 11/08/22 0538  PLT 403* 344  -Continue to Monitor and Trend and repeat CBC in the AM   Essential Hypertension -Continue Amlodipine 5 mg p.o. daily. -Hold diuretic due to volume depletion and mild Hypotension -Renewed IV fluid hydration -Continue to monitor blood pressures per protocol  -Last blood pressure reading was 100/57   Intermittent claudication (HCC) Paroxysmal digital cyanosis -Continue cilostazol 100 mg p.o. twice daily.   CKD (chronic kidney disease), stage IIIa (HCC) Metabolic Acidosis -BUN/Cr Trend: Recent Labs  Lab 11/07/22 1148 11/08/22 0538  BUN 15  11  CREATININE 1.18* 1.05*  -Patient has a slight metabolic acidosis with a CO2 of 21,  anion gap of 11, chloride level 106 Will resume IV fluid hydration as above -Avoid Nephrotoxic Medications, Contrast Dyes, Hypotension and Dehydration to Ensure Adequate Renal Perfusion and will need to Renally Adjust Meds -Continue to Monitor and Trend Renal Function carefully and repeat CMP in the AM    Emphysema lung (HCC) -Has not used her inhaler in a while. -Continue Short acting bronchodilators as needed. -Continue to Monitor Respiratory Status Carefully  Hypoalbuminemia -Patient's Albumin Trend: Recent Labs  Lab 11/07/22 1148 11/08/22 0538  ALBUMIN 3.6 2.9*  -Continue to Monitor and Trend and repeat CMP in the AM   DVT prophylaxis: SCDs Start: 11/07/22 1638    Code Status: Full Code Family Communication: Discussed with Husband Dr. Clarisse Gouge at bedside  Disposition Plan:  Level of care: Med-Surg Status is: Inpatient Remains inpatient appropriate because: Needs surgical intervention and will be undergoing this in the AM   Consultants:  Gastroenterology General Surgery  Procedures:  ERCP Findings:      The scout film was normal.      A standard esophagogastroduodenoscopy scope was used for the examination       of the upper gastrointestinal tract. The scope was passed under direct       vision through the upper GI tract. No gross mucosal lesions were noted       in the entire esophagus. The Z-line was irregular and was found 40 cm       from the incisors. A 2 cm hiatal hernia was present. Patchy mildly       erythematous mucosa without bleeding was found in the entire examined       stomach - biopsied for H. pylori evaluation. No gross lesions were noted       in the duodenal bulb, in the first portion of the duodenum and in the       second portion of the duodenum. The major papilla was adjacent to a       diverticulum. The major papilla was otherwise normal.      A short 0.035 inch Soft Jagwire was passed into the biliary tree. The       Hydratome sphincterotome was  passed over the guidewire and the bile duct       was then deeply cannulated. Contrast was injected. I personally       interpreted the bile duct images. Ductal flow of contrast was adequate.       Image quality was adequate. Contrast extended to the hepatic ducts.       Opacification of the entire biliary tree except for the cystic duct and       gallbladder was successful. The lower third of the main bile duct       contained filling defects thought to be stones and sludge. The main bile       duct was mildly dilated. The largest diameter was 13 mm. A 9 mm biliary       sphincterotomy was made with a monofilament Hydratome sphincterotome       using ERBE electrocautery. There was no post-sphincterotomy bleeding. To       discover objects, the biliary tree was swept with a retrieval balloon.       Sludge was swept from the duct. A few stones were removed. No stones       remained. An occlusion cholangiogram was performed that showed  no       further significant biliary pathology; the cystic duct and gallbladder       did not fill.      A pancreatogram was not performed.      The duodenoscope was withdrawn from the patient. Impression:               - No gross lesions in the entire esophagus. Z-line                            irregular, 40 cm from the incisors.                           - 2 cm hiatal hernia.                           - Erythematous mucosa in the stomach. Biopsied.                           - No gross lesions in the duodenal bulb, in the                            first portion of the duodenum and in the second                            portion of the duodenum.                           - The major papilla was adjacent to a diverticulum.                            Otherwise it was normal.                           - Filling defects consistent with stones and sludge                            were seen on the cholangiogram.                           - The entire main bile  duct was mildly dilated.                           - Choledocholithiasis was found. Complete removal                            was accomplished by biliary sphincterotomy and                            balloon trawl. Moderate Sedation:      Not Applicable - Patient had care per Anesthesia. Recommendation:           - The patient will be observed post-procedure,                            until all discharge criteria are met.                           -  Return patient to hospital ward for ongoing care.                           - Advance diet as tolerated.                           - Observe patient's clinical course.                           - Watch for pancreatitis, bleeding, perforation,                            and cholangitis.                           - Check liver enzymes (AST, ALT, alkaline                            phosphatase, bilirubin) in the morning.                           - Consultation with surgery to discuss interval                            cholecystectomy timing pending patient does well.                           - Await path results.                           - The findings and recommendations were discussed                            with the patient.                           - The findings and recommendations were discussed                            with the patient's family.                           - The findings and recommendations were discussed                            with the referring physician.  Antimicrobials:  Anti-infectives (From admission, onward)    None       Subjective: Seen and examined at bedside after ERCP and she denies any current complaint or nausea or vomiting.  States that she feels better.  Denies any other concerns or complaints this time and denies any chest pain or shortness of breath.  Objective: Vitals:   11/08/22 1005 11/08/22 1010 11/08/22 1026 11/08/22 1345  BP: (!) 97/58 104/70 108/64 (!) 100/57  Pulse: 66 65  63 65  Resp: 15 17 16 15   Temp:   (!) 97.4 F (36.3 C) 98.2 F (36.8 C)  TempSrc:   Oral   SpO2: 92% 92% 91%  93%  Weight:      Height:        Intake/Output Summary (Last 24 hours) at 11/08/2022 1534 Last data filed at 11/08/2022 1420 Gross per 24 hour  Intake 2867.45 ml  Output --  Net 2867.45 ml   Filed Weights   11/07/22 1146 11/08/22 0801  Weight: 63.5 kg 63.5 kg   Examination: Physical Exam:  Constitutional: WN/WD overweight Caucasian female in no acute distress appears calm and resting Respiratory: Diminished to auscultation bilaterally, no wheezing, rales, rhonchi or crackles. Normal respiratory effort and patient is not tachypenic. No accessory muscle use.  Unlabored breathing Cardiovascular: RRR, no murmurs / rubs / gallops. S1 and S2 auscultated. No extremity edema. Abdomen: Soft, non-tender, distended secondary to body habitus. Bowel sounds positive.  GU: Deferred. Musculoskeletal: No clubbing / cyanosis of digits/nails. No joint deformity upper and lower extremities.  Skin: No rashes, lesions, ulcers on limited skin evaluation. No induration; Warm and dry.  Neurologic: CN 2-12 grossly intact with no focal deficits. Romberg sign and cerebellar reflexes not assessed.  Psychiatric: Normal judgment and insight. Alert and oriented x 3. Normal mood and appropriate affect.   Data Reviewed: I have personally reviewed following labs and imaging studies  CBC: Recent Labs  Lab 11/07/22 1148 11/08/22 0538  WBC 9.6 5.6  HGB 14.3 12.7  HCT 42.2 39.0  MCV 101.7* 107.4*  PLT 403* 344   Basic Metabolic Panel: Recent Labs  Lab 11/07/22 1148 11/08/22 0538  NA 135 138  K 3.4* 3.9  CL 97* 106  CO2 24 21*  GLUCOSE 109* 97  BUN 15 11  CREATININE 1.18* 1.05*  CALCIUM 9.4 8.4*  MG 1.8  --   PHOS 3.6  --    GFR: Estimated Creatinine Clearance: 40.1 mL/min (A) (by C-G formula based on SCr of 1.05 mg/dL (H)). Liver Function Tests: Recent Labs  Lab 11/07/22 1148  11/08/22 0538  AST 206* 91*  ALT 139* 96*  ALKPHOS 394* 285*  BILITOT 3.0* 1.1  PROT 8.3* 6.3*  ALBUMIN 3.6 2.9*   Recent Labs  Lab 11/07/22 1148  LIPASE 52*   No results for input(s): "AMMONIA" in the last 168 hours. Coagulation Profile: Recent Labs  Lab 11/08/22 0538  INR 1.1   Cardiac Enzymes: No results for input(s): "CKTOTAL", "CKMB", "CKMBINDEX", "TROPONINI" in the last 168 hours. BNP (last 3 results) Recent Labs    07/16/22 1000  PROBNP 34.0   HbA1C: No results for input(s): "HGBA1C" in the last 72 hours. CBG: No results for input(s): "GLUCAP" in the last 168 hours. Lipid Profile: No results for input(s): "CHOL", "HDL", "LDLCALC", "TRIG", "CHOLHDL", "LDLDIRECT" in the last 72 hours. Thyroid Function Tests: No results for input(s): "TSH", "T4TOTAL", "FREET4", "T3FREE", "THYROIDAB" in the last 72 hours. Anemia Panel: No results for input(s): "VITAMINB12", "FOLATE", "FERRITIN", "TIBC", "IRON", "RETICCTPCT" in the last 72 hours. Sepsis Labs: No results for input(s): "PROCALCITON", "LATICACIDVEN" in the last 168 hours.  No results found for this or any previous visit (from the past 240 hour(s)).   Radiology Studies: DG ERCP  Result Date: 11/08/2022 CLINICAL DATA:  Biliary dilatation. Jaundice. Abnormal liver function test. EXAM: ERCP TECHNIQUE: Multiple spot images obtained with the fluoroscopic device and submitted for interpretation post-procedure. FLUOROSCOPY: Radiation Exposure Index (as provided by the fluoroscopic device): 21.29 mGy COMPARISON:  CT abdomen and pelvis 11/07/2022 FINDINGS: Retrograde cholangiogram was performed. Mild dilatation of the biliary system with poorly defined filling defects in the distal common bile duct. Findings are suggestive  for stones or sludge. Evidence for a balloon sweep. IMPRESSION: Choledocholithiasis with mild biliary dilatation. These images were submitted for radiologic interpretation only. Electronically Signed   By: Richarda Overlie M.D.   On: 11/08/2022 11:32   CT ABDOMEN PELVIS W CONTRAST  Result Date: 11/07/2022 CLINICAL DATA:  Elevated liver enzymes. Concern for biliary obstruction. EXAM: CT ABDOMEN AND PELVIS WITH CONTRAST TECHNIQUE: Multidetector CT imaging of the abdomen and pelvis was performed using the standard protocol following bolus administration of intravenous contrast. RADIATION DOSE REDUCTION: This exam was performed according to the departmental dose-optimization program which includes automated exposure control, adjustment of the mA and/or kV according to patient size and/or use of iterative reconstruction technique. CONTRAST:  OMNIPAQUE IOHEXOL 300 MG/ML  SOLN COMPARISON:  CT abdomen pelvis dated 01/24/2009 and ultrasound dated 11/07/2022. FINDINGS: Lower chest: Minimal bibasilar subpleural atelectasis. The visualized lung bases are otherwise clear. There is background of mild emphysema. No intra-abdominal free air or free fluid. Hepatobiliary: There is slight irregularity of the liver contour. Clinical correlation recommended to evaluate for possibility of early cirrhosis. There multiple stones within the gallbladder. There is mild dilatation of the intrahepatic biliary tree as well as mild dilatation of the common bile duct. The common bile duct measures up to 12 mm in diameter. Several stones noted in the central CBD at the head of the pancreas. Pancreas: Unremarkable. No pancreatic ductal dilatation or surrounding inflammatory changes. Spleen: Normal in size without focal abnormality. Adrenals/Urinary Tract: The adrenal glands are unremarkable. Mild left renal parenchyma atrophy and cortical scarring. There is no hydronephrosis on either side. There is symmetric enhancement and excretion of contrast by both kidneys. The visualized ureters and urinary bladder appear unremarkable. Stomach/Bowel: There is no bowel obstruction or active inflammation. The appendix is not visualized with certainty. No  inflammatory changes identified in the right lower quadrant. Vascular/Lymphatic: Moderate aortoiliac atherosclerotic disease. The IVC is unremarkable. No portal venous gas. There is no adenopathy. Reproductive: Hysterectomy.  No adnexal masses. Other: None Musculoskeletal: Degenerative changes of the spine. No acute osseous pathology. IMPRESSION: 1. Choledocholithiasis with mild intrahepatic and extrahepatic biliary ductal dilatation. Further evaluation with MRCP or ERCP is recommended. 2. Cholelithiasis. 3. No bowel obstruction. 4. Aortic Atherosclerosis (ICD10-I70.0) and Emphysema (ICD10-J43.9). Electronically Signed   By: Elgie Collard M.D.   On: 11/07/2022 16:44   US Abdomen Limited  Result Date: 11/07/2022 CLINICAL DATA:  Pain right upper quadrant x2 weeks EXAM: ULTRASOUND ABDOMEN LIMITED RIGHT UPPER QUADRANT COMPARISON:  None Available. FINDINGS: Gallbladder: Echogenic structures are seen in the dependent portion of the gallbladder suggesting sludge and stones. Technologist did not observe any tenderness over the gallbladder. There is no fluid around the gallbladder. Common bile duct: Diameter: 4 mm Liver: No focal lesion identified. Within normal limits in parenchymal echogenicity. Portal vein is patent on color Doppler imaging with normal direction of blood flow towards the liver. Other: None. IMPRESSION: Sludge and stones are seen in gallbladder. There are no signs of acute cholecystitis. There is no dilation of bile ducts. Electronically Signed   By: Ernie Avena M.D.   On: 11/07/2022 14:12    Scheduled Meds:  amLODipine  5 mg Oral BID   aspirin  81 mg Oral Daily   Chlorhexidine Gluconate Cloth  6 each Topical Once   And   Chlorhexidine Gluconate Cloth  6 each Topical Once   cilostazol  100 mg Oral BID   cyanocobalamin  1,000 mcg Oral Daily   FLUoxetine  40 mg  Oral Daily   isosorbide mononitrate  60 mg Oral Daily   pantoprazole (PROTONIX) IV  40 mg Intravenous Q24H   Continuous  Infusions:  0.9 % NaCl with KCl 20 mEq / L      LOS: 1 day   Marguerita Merles, DO [facility] Available via Epic secure chat 7am-7pm After these hours, please refer to coverage provider listed on [email or web address] 11/08/2022, 3:34 PM

## 2022-11-08 NOTE — Transfer of Care (Signed)
Immediate Anesthesia Transfer of Care Note  Patient: Kari Welch  Procedure(s) Performed: ENDOSCOPIC RETROGRADE CHOLANGIOPANCREATOGRAPHY (ERCP)  Patient Location: PACU  Anesthesia Type:General  Level of Consciousness: awake and patient cooperative  Airway & Oxygen Therapy: Patient Spontanous Breathing and Patient connected to nasal cannula oxygen  Post-op Assessment: Report given to RN and Post -op Vital signs reviewed and stable  Post vital signs: Reviewed and stable  Last Vitals:  Vitals Value Taken Time  BP    Temp    Pulse 65 11/08/22 0938  Resp 16 11/08/22 0938  SpO2 99 % 11/08/22 0938  Vitals shown include unfiled device data.  Last Pain:  Vitals:   11/08/22 0801  TempSrc: Tympanic  PainSc: 0-No pain         Complications: No notable events documented.

## 2022-11-08 NOTE — Anesthesia Postprocedure Evaluation (Signed)
Anesthesia Post Note  Patient: Kari Welch  Procedure(s) Performed: ENDOSCOPIC RETROGRADE CHOLANGIOPANCREATOGRAPHY (ERCP) SPHINCTEROTOMY REMOVAL OF STONES BIOPSY ESOPHAGOGASTRODUODENOSCOPY (EGD)     Patient location during evaluation: PACU Anesthesia Type: General Level of consciousness: awake and alert Pain management: pain level controlled Vital Signs Assessment: post-procedure vital signs reviewed and stable Respiratory status: spontaneous breathing, nonlabored ventilation, respiratory function stable and patient connected to nasal cannula oxygen Cardiovascular status: blood pressure returned to baseline and stable Postop Assessment: no apparent nausea or vomiting Anesthetic complications: no  No notable events documented.  Last Vitals:  Vitals:   11/08/22 1026 11/08/22 1345  BP: 108/64 (!) 100/57  Pulse: 63 65  Resp: 16 15  Temp: (!) 36.3 C 36.8 C  SpO2: 91% 93%    Last Pain:  Vitals:   11/08/22 1026  TempSrc: Oral  PainSc:                  Shelton Silvas

## 2022-11-08 NOTE — Consult Note (Signed)
Kari Welch [facility] November 15, 1945  [id number].    Requesting MD: Dr. Corliss Parish Chief Complaint/Reason for Consult: choledocholithiasis  HPI:  Kari Welch is a 77 yo female who presented yesterday with acute epigastric and RUQ abdominal pain. She has been having intermittent epigastric pain for the last 2 weeks, which usually occurs after eating and is only relieved by vomiting. The pain acutely worsened two nights ago and she came to the ED yesterday. She was found to have mildly elevated LFTs (Tbili 3.0). A RUQ US showed cholelithiasis without signs of acute cholecystitis. A CT scan was suspicious for choledocholithiasis. She was admitted and this morning underwent an ERCP with stone extraction from the CBD by Dr. Meridee Score. Tbili is down to 1.1 today. General surgery has been consulted for cholecystectomy. She is currently not having pain.  Her only prior abdominal surgery is a hysterectomy.  ROS: Review of Systems  Constitutional:  Negative for chills and fever.  Cardiovascular:  Negative for chest pain.  Gastrointestinal:  Positive for abdominal pain, nausea and vomiting.    Family History  Problem Relation Age of Onset   Stroke Mother    Colon cancer Mother 40       dec with colon ca   Heart attack Mother    Migraines Mother    Stroke Father    Heart attack Father    Gallbladder disease Father 39       septic gallbladder   Rheum arthritis Brother        dec age 53 ?Rheumatoid arthritis   Diabetes Maternal Grandmother    Esophageal cancer Neg Hx    Stomach cancer Neg Hx    Rectal cancer Neg Hx     Past Medical History:  Diagnosis Date   Abnormal Pap smear    h/o   Allergy    seasonal   Arthritis    Collagen vascular disease (HCC)    Elevated LFTs    Work up negative - Dr. Juanda Chance   Hyperlipidemia    Hyperplastic colon polyp    Hypertension    Internal hemorrhoids    INTERNAL HEMORRHOIDS 01/24/2009   Qualifier: Diagnosis of  By: Nelson-Smith CMA (AAMA),  Dottie     Migraine    Prothrombin gene mutation (HCC)    heterozygous   PVD (peripheral vascular disease) (HCC)    takes Pletal to open blood flow of blood to fingers, toes, and lower extremities   Raynaud's disease    Vasculitis (HCC)    Venous insufficiency    superficial left leg- seeing a vein specialist in August    Past Surgical History:  Procedure Laterality Date   ABDOMINAL HYSTERECTOMY  1979   TAH/LSO   ABLATION SAPHENOUS VEIN W/ RFA     BREAST BIOPSY Left 10/27/2022   MM LT BREAST BX W LOC DEV 1ST LESION IMAGE BX SPEC STEREO GUIDE 10/27/2022 [facility]   BUNIONECTOMY WITH HAMMERTOE RECONSTRUCTION Left 10/13/2013   Procedure: LEFT FIRST METATARSAL SCARF OSTEOTOMY;  LEFT MODIFIED MCBRIDE BUNIONECTOMY AND SECOND HAMMERTOE CORRECTION;  Surgeon: Toni Arthurs, MD;  Location: [facility];  Service: Orthopedics;  Laterality: Left;   CATARACT EXTRACTION Bilateral 09/2014   Dr. Vonna Kotyk   CERVIX LESION DESTRUCTION  1970   COLONOSCOPY     COLONOSCOPY     DILATION AND CURETTAGE OF UTERUS     LASER ABLATION Bilateral    leg-left 8/14, right 12/14   MASS EXCISION Left    hand   osteopenia of hip  2017   T score -1.1   PELVIC LAPAROSCOPY     d/t infertility   right bartholin's gland cyst excision Right    1990s   sympthectomy Left 2008   hand ([facility])    Social History:  reports that she quit smoking about 34 years ago. Her smoking use included cigarettes. She started smoking about 52 years ago. She has a 9 pack-year smoking history. She has never used smokeless tobacco. She reports current alcohol use of about 2.0 standard drinks of alcohol per week. She reports that she does not use drugs.  Allergies:  Allergies  Allergen Reactions   Methylprednisolone     With IM injection- Panic attacks, trouble with concentration, hyperactive, hyperventilation   Prednisone Swelling    short course=swelling    Medications Prior to Admission  Medication Sig Dispense  Refill   albuterol (PROAIR HFA) 108 (90 Base) MCG/ACT inhaler Inhale 1-2 puffs into the lungs every 6 (six) hours as needed for wheezing or shortness of breath. 18 g 0   ALPRAZolam (XANAX) 0.5 MG tablet Take 1 tablet (0.5 mg total) by mouth every 8 (eight) hours as needed for anxiety. Do not drive for 8 hours after use. 30 tablet 0   amLODipine (NORVASC) 5 MG tablet Take 1 tablet (5 mg total) by mouth 2 (two) times daily. 180 tablet 3   aspirin 81 MG chewable tablet Chew 81 mg by mouth daily.     cilostazol (PLETAL) 100 MG tablet Take 1 tablet (100 mg total) by mouth 2 (two) times daily 180 tablet 3   cyanocobalamin 1000 MCG tablet Take 1,000 mcg by mouth daily.     EPINEPHrine 0.3 mg/0.3 mL IJ SOAJ injection Inject 0.3 mg into the muscle as needed for anaphylaxis. 2 each 0   estradiol (CLIMARA - DOSED IN MG/24 HR) 0.0375 mg/24hr patch Place 1 patch (0.0375 mg total) onto the skin once a week. 12 patch 3   FLUoxetine (PROZAC) 40 MG capsule Take 1 capsule (40 mg total) by mouth daily. 90 capsule 3   HYDROcodone-acetaminophen (NORCO) 10-325 MG tablet TAKE (1) TABLET EVERY FOUR TO SIX HOURS AS NEEDED. Do not drive for 6 hours after use. (Patient taking differently: Take 1 tablet by mouth every 4 (four) hours as needed for moderate pain.) 30 tablet 0   isosorbide mononitrate (IMDUR) 60 MG 24 hr tablet Take 1 tablet (60 mg total) by mouth daily. 90 tablet 3   ondansetron (ZOFRAN-ODT) 4 MG disintegrating tablet Take 1 tablet (4 mg total) by mouth every 8 (eight) hours as needed for nausea or vomiting. 20 tablet 0   potassium chloride SA (K-DUR,KLOR-CON) 20 MEQ tablet TAKE 1 TABLET DAILY. (Patient taking differently: Take 20 mEq by mouth daily.) 30 tablet 0   Rimegepant Sulfate (NURTEC) 75 MG TBDP Take 75 mg by mouth daily as needed. 8 tablet 5   rosuvastatin (CRESTOR) 10 MG tablet Take 1 tablet (10 mg total) by mouth once daily 90 tablet 3   triamterene-hydrochlorothiazide (MAXZIDE-25) 37.5-25 MG tablet  Take 1 tablet by mouth once daily 90 tablet 3     Physical Exam: Blood pressure 108/64, pulse 63, temperature (!) 97.4 F (36.3 C), temperature source Oral, resp. rate 16, height 5\' 1"  (1.549 m), weight 63.5 kg, last menstrual period 03/31/1977, SpO2 91%. General: resting comfortably, appears stated age, no apparent distress Neurological: alert and oriented, no focal deficits HEENT: normocephalic, atraumatic, no scleral icterus Respiratory: normal work of breathing on room air Abdomen: soft, nondistended, nontender  to deep palpation. No surgical scars in the upper abdomen. Extremities: warm and well-perfused, no deformities, moving all extremities spontaneously Psychiatric: normal mood and affect Skin: warm and dry, no jaundice, no rashes or lesions      Assessment/Plan This is a 77 yo female presenting with elevated LFTs and abdominal pain secondary to choledocholithiasis. She is now s/p ERCP with stone extraction. LFTs were already downtrending this morning, and lipase was close to normal at admission. Will tentatively plan for laparoscopic cholecystectomy tomorrow morning. I reviewed the procedure details with the patient, including the benefits and risk. She and her husband expressed understanding, and she agrees to proceed with surgery. All questions were answered. Please keep NPO after midnight tonight.   Sophronia Simas, MD [facility] General, Hepatobiliary and Pancreatic Surgery 11/08/22 10:29 AM

## 2022-11-08 NOTE — Anesthesia Preprocedure Evaluation (Signed)
Anesthesia Evaluation  Patient identified by MRN, date of birth, ID band Patient awake    Reviewed: Allergy & Precautions, NPO status , Patient's Chart, lab work & pertinent test results  Airway Mallampati: III  TM Distance: >3 FB Neck ROM: Full    Dental  (+) Dental Advisory Given, Teeth Intact   Pulmonary COPD, former smoker   Pulmonary exam normal breath sounds clear to auscultation       Cardiovascular hypertension, Pt. on medications + Peripheral Vascular Disease  Normal cardiovascular exam Rhythm:Regular Rate:Normal     Neuro/Psych  Headaches PSYCHIATRIC DISORDERS         GI/Hepatic negative GI ROS, Neg liver ROS,,,  Endo/Other  negative endocrine ROS    Renal/GU Renal disease     Musculoskeletal  (+) Arthritis ,    Abdominal  (+) - obese  Peds  Hematology negative hematology ROS (+)   Anesthesia Other Findings   Reproductive/Obstetrics                             Anesthesia Physical Anesthesia Plan  ASA: 3  Anesthesia Plan: General   Post-op Pain Management: Minimal or no pain anticipated   Induction: Intravenous  PONV Risk Score and Plan: 4 or greater and Ondansetron, Treatment may vary due to age or medical condition and Dexamethasone  Airway Management Planned: Oral ETT  Additional Equipment: None  Intra-op Plan:   Post-operative Plan: Extubation in OR  Informed Consent: I have reviewed the patients History and Physical, chart, labs and discussed the procedure including the risks, benefits and alternatives for the proposed anesthesia with the patient or authorized representative who has indicated his/her understanding and acceptance.     Dental advisory given  Plan Discussed with: CRNA  Anesthesia Plan Comments:        Anesthesia Quick Evaluation

## 2022-11-08 NOTE — Plan of Care (Signed)
  Problem: Coping: Goal: Level of anxiety will decrease Outcome: Progressing   Problem: Pain Managment: Goal: General experience of comfort will improve Outcome: Progressing   Problem: Coping: Goal: Level of anxiety will decrease Outcome: Progressing   Problem: Pain Managment: Goal: General experience of comfort will improve Outcome: Progressing

## 2022-11-08 NOTE — Hospital Course (Signed)
The patient is a 77 year old Caucasian female with past medical history significant for vomiting abnormal Pap smears, seasonal allergies, osteoarthritis, collagen vascular disease, previously elevated LFTs with negative workup, hyperlipidemia, hyperplastic colonic polyp, hypertension, internal hemorrhoids, migraine headaches, heterozygous prothrombin gene mutation, peripheral vascular disease, Raynaud's disease, vasculitis, venous insufficiency as well as other comorbidities who presented to the emergency room with complaints of abdominal discomfort, nausea and emesis that roughly started an hour after she eats for last few weeks.  The pain was in the epigastric right upper quadrant area that radiated to her back and felt like it was a "band going around" and then pressure in her abdomen.  She states that this abdominal discomfort was usually relieved by vomiting and to yesterday she had not eaten much in the last 2 days and subsequently her symptoms felt a lot better but she ate some chicken Parmesan yesterday evening and since her symptoms reappeared.  Given her symptoms she was brought to the ED and worked up and was found to have sludge and stones in the gallbladder with no acute signs of cholecystitis and no dilation of the bile duct.  She underwent CT scan imaging and was found to have elevated LFTs and showed a progressive biliary duct dilatation with some slight liver contour irregularity.  She underwent ERCP and was found to have choledocholithiasis and general surgery's been consulted for surgical intervention and plan is for cholecystectomy at some time tomorrow.  Assessment and Plan:  Abnormal LFTs Obstructive hyperbilirubinemia in the setting of choledocholithiasis Abdominal pain, chronic, right upper quadrant Intractable nausea and vomiting -She is admitted to inpatient/MedSurg. -Continue IV fluids and resumed normal saline with 20 mill colons of KCl at 75 mL/h for another 20 hours -Clear  liquid diet advance to heart healthy diet per gastroenterology will be made n.p.o. after midnight -Liver Function Trend: Recent Labs  Lab 11/07/22 1148 11/08/22 0538 11/09/22 0525  AST 206* 91* 39  ALT 139* 96* 64*  BILITOT 3.0* 1.1 0.4  ALKPHOS 394* 285* 209*  -C/w Analgesics as needed with acetaminophen 650 mg p.o./RC every 6 as needed for mild pain or fever, oxycodone 5 mg every 4 as needed for moderate pain and IV hydromorphone 0.5 mg every 2 as needed severe pain -Continue with antiemetics as needed with ondansetron 4 mg p.o./IV every 6 as needed for nausea -Pantoprazole 40 mg IVP daily. -Follow CBC, CMP and lipase in AM. -Gastroenterology consult appreciated and she was taken for ERCP and was found to have choledocholithiasis that were completely removed by biliary center anatomy and balloon withdrawal -After the patient's ERCP, General Surgery was consulted and took the patient for laparoscopic cholecystectomy and she was stable after this and general surgery felt we could discharge her home safely and she will need to follow-up with PCP and gastroenterology and general surgery outpatient setting   Hyperlipidemia -Hold statin due to abnormal LFTs for least a week  Thrombocytosis  -Likely reactive in the setting of above -Platelet count trend: Recent Labs  Lab 11/07/22 1148 11/08/22 0538 11/09/22 0525  PLT 403* 344 348  -Continue to Monitor and Trend and repeat CBC within 1 week   Essential Hypertension -Continue Amlodipine 5 mg p.o. daily. -Continue to hold diuretic due to volume depletion and mild Hypotension -Renewed IV fluid hydration -Continue to monitor blood pressures per protocol  -Last blood pressure reading was 100/57  Hypomagnesemia -Patient's Mag Level Trend: Recent Labs  Lab 11/07/22 1148 11/09/22 0525  MG 1.8 1.5*  -Replete with  IV Mag Sulfate 4 grams prior to D/C -Continue to Monitor and Replete as Necessary -Repeat Mag Level within 1 week    Intermittent claudication (HCC) Paroxysmal digital cyanosis -Continue cilostazol 100 mg p.o. twice daily.   CKD (chronic kidney disease), stage IIIa (HCC) Metabolic Acidosis -BUN/Cr Trend: Recent Labs  Lab 11/07/22 1148 11/08/22 0538 11/09/22 0525  BUN 15 11 13   CREATININE 1.18* 1.05* 0.96  -Patient had a slight metabolic acidosis with a CO2 of 21, anion gap of 11, chloride level 106; now CO2 is 22, anion gap is 10, chloride level is 107 -Avoid Nephrotoxic Medications, Contrast Dyes, Hypotension and Dehydration to Ensure Adequate Renal Perfusion and will need to Renally Adjust Meds -Continue to Monitor and Trend Renal Function carefully and repeat CMP within 1 week   Emphysema lung (HCC) -Has not used her inhaler in a while. -Continue Short acting bronchodilators as needed. -Continue to Monitor Respiratory Status Carefully and she is stable and can be discharged  Hypoalbuminemia -Patient's Albumin Trend: Recent Labs  Lab 11/07/22 1148 11/08/22 0538 11/09/22 0525  ALBUMIN 3.6 2.9* 2.7*  -Continue to Monitor and Trend and repeat CMP within 1 week

## 2022-11-08 NOTE — Interval H&P Note (Signed)
History and Physical Interval Note:  11/08/2022 8:15 AM  Kari Welch  has presented today for surgery, with the diagnosis of Abnormal LFTs possible Choledocholithiasis.  The various methods of treatment have been discussed with the patient and family. After consideration of risks, benefits and other options for treatment, the patient has consented to  Procedure(s): ENDOSCOPIC RETROGRADE CHOLANGIOPANCREATOGRAPHY (ERCP) (N/A) as a surgical intervention.  The patient's history has been reviewed, patient examined, no change in status, stable for surgery.  I have reviewed the patient's chart and labs.  Questions were answered to the patient's satisfaction.     The risks of an ERCP were discussed at length, including but not limited to the risk of perforation, bleeding, abdominal pain, post-ERCP pancreatitis (while usually mild can be severe and even life threatening).    [employer]

## 2022-11-08 NOTE — Anesthesia Procedure Notes (Signed)
Procedure Name: Intubation Date/Time: 11/08/2022 8:50 AM  Performed by: Adria Dill, CRNAPre-anesthesia Checklist: Patient identified, Emergency Drugs available, Suction available and Patient being monitored Patient Re-evaluated:Patient Re-evaluated prior to induction Oxygen Delivery Method: Circle system utilized Preoxygenation: Pre-oxygenation with 100% oxygen Induction Type: IV induction Ventilation: Mask ventilation without difficulty Laryngoscope Size: Miller and 3 Grade View: Grade I Tube type: Oral Tube size: 7.0 mm Number of attempts: 1 Airway Equipment and Method: Stylet and Oral airway Placement Confirmation: ETT inserted through vocal cords under direct vision, positive ETCO2 and breath sounds checked- equal and bilateral Secured at: 21 cm Tube secured with: Tape Dental Injury: Teeth and Oropharynx as per pre-operative assessment

## 2022-11-08 NOTE — Progress Notes (Signed)
I believe this patient has previously been reviewed and accepted to practice by Dr. Leone Payor (patient's husband is Dr. Marvell Fuller patient and Dr. Leone Payor had previously given recommendations in regards to colonoscopy follow-up). I will evaluate this patient, if she is admitted to the hospital.

## 2022-11-08 NOTE — Op Note (Signed)
[facility] Patient Name: Kari Welch Procedure Date: 11/08/2022 MRN: [id number] Attending MD: Corliss Parish , MD, [id number] Date of Birth: [birth year] CSN: [id number] Age: 77 Admit Type: Inpatient Procedure:                ERCP Indications:              Bile duct stone(s), Epigastric abdominal pain,                            Abnormal abdominal CT, Biliary dilation on Computed                            Tomogram Scan, Jaundice, Abnormal liver function                            test Providers:                Corliss Parish, MD, Eliberto Ivory, RN, Priscella Mann, Technician Referring MD:             Inpatient medical service Medicines:                General Anesthesia, Cipro 400 mg IV, Diclofenac 100                            mg rectal, Glucagon 0.5 mg IV Complications:            No immediate complications. Estimated Blood Loss:     Estimated blood loss was minimal. Procedure:                Pre-Anesthesia Assessment:                           - Prior to the procedure, a History and Physical                            was performed, and patient medications and                            allergies were reviewed. The patient's tolerance of                            previous anesthesia was also reviewed. The risks                            and benefits of the procedure and the sedation                            options and risks were discussed with the patient.                            All questions were answered, and informed consent  was obtained. Prior Anticoagulants: The patient has                            taken no anticoagulant or antiplatelet agents                            except for aspirin. ASA Grade Assessment: III - A                            patient with severe systemic disease. After                            reviewing the risks and benefits, the patient was                             deemed in satisfactory condition to undergo the                            procedure.                           After obtaining informed consent, the scope was                            passed under direct vision. Throughout the                            procedure, the patient's blood pressure, pulse, and                            oxygen saturations were monitored continuously. The                            GIF-H190 ([id number]) Olympus endoscope was introduced                            through the mouth, and used to inject contrast into                            and used to locate the major papilla. The [employer] D single use duodenoscope                            was introduced through the mouth, and used to                            inject contrast into and used to cannulate the bile                            duct. The ERCP was accomplished without difficulty.  The patient tolerated the procedure. Scope In: Scope Out: Findings:      The scout film was normal.      A standard esophagogastroduodenoscopy scope was used for the examination       of the upper gastrointestinal tract. The scope was passed under direct       vision through the upper GI tract. No gross mucosal lesions were noted       in the entire esophagus. The Z-line was irregular and was found 40 cm       from the incisors. A 2 cm hiatal hernia was present. Patchy mildly       erythematous mucosa without bleeding was found in the entire examined       stomach - biopsied for H. pylori evaluation. No gross lesions were noted       in the duodenal bulb, in the first portion of the duodenum and in the       second portion of the duodenum. The major papilla was adjacent to a       diverticulum. The major papilla was otherwise normal.      A short 0.035 inch Soft Jagwire was passed into the biliary tree. The       Hydratome sphincterotome was passed  over the guidewire and the bile duct       was then deeply cannulated. Contrast was injected. I personally       interpreted the bile duct images. Ductal flow of contrast was adequate.       Image quality was adequate. Contrast extended to the hepatic ducts.       Opacification of the entire biliary tree except for the cystic duct and       gallbladder was successful. The lower third of the main bile duct       contained filling defects thought to be stones and sludge. The main bile       duct was mildly dilated. The largest diameter was 13 mm. A 9 mm biliary       sphincterotomy was made with a monofilament Hydratome sphincterotome       using ERBE electrocautery. There was no post-sphincterotomy bleeding. To       discover objects, the biliary tree was swept with a retrieval balloon.       Sludge was swept from the duct. A few stones were removed. No stones       remained. An occlusion cholangiogram was performed that showed no       further significant biliary pathology; the cystic duct and gallbladder       did not fill.      A pancreatogram was not performed.      The duodenoscope was withdrawn from the patient. Impression:               - No gross lesions in the entire esophagus. Z-line                            irregular, 40 cm from the incisors.                           - 2 cm hiatal hernia.                           - Erythematous mucosa in the stomach. Biopsied.                           -  No gross lesions in the duodenal bulb, in the                            first portion of the duodenum and in the second                            portion of the duodenum.                           - The major papilla was adjacent to a diverticulum.                            Otherwise it was normal.                           - Filling defects consistent with stones and sludge                            were seen on the cholangiogram.                           - The entire main bile duct  was mildly dilated.                           - Choledocholithiasis was found. Complete removal                            was accomplished by biliary sphincterotomy and                            balloon trawl. Moderate Sedation:      Not Applicable - Patient had care per Anesthesia. Recommendation:           - The patient will be observed post-procedure,                            until all discharge criteria are met.                           - Return patient to hospital ward for ongoing care.                           - Advance diet as tolerated.                           - Observe patient's clinical course.                           - Watch for pancreatitis, bleeding, perforation,                            and cholangitis.                           - Check liver enzymes (AST, ALT, alkaline  phosphatase, bilirubin) in the morning.                           - Consultation with surgery to discuss interval                            cholecystectomy timing pending patient does well.                           - Await path results.                           - The findings and recommendations were discussed                            with the patient.                           - The findings and recommendations were discussed                            with the patient's family.                           - The findings and recommendations were discussed                            with the referring physician. Procedure Code(s):        --- Professional ---                           [phone], Endoscopic retrograde                            cholangiopancreatography (ERCP); with removal of                            calculi/debris from biliary/pancreatic duct(s)                           43262, Endoscopic retrograde                            cholangiopancreatography (ERCP); with                            sphincterotomy/papillotomy                           74328,  26, Endoscopic catheterization of the                            biliary ductal system, radiological supervision and                            interpretation Diagnosis Code(s):        --- Professional ---  K22.89, Other specified disease of esophagus                           K44.9, Diaphragmatic hernia without obstruction or                            gangrene                           K31.89, Other diseases of stomach and duodenum                           K83.8, Other specified diseases of biliary tract                           K80.50, Calculus of bile duct without cholangitis                            or cholecystitis without obstruction                           R10.13, Epigastric pain                           R17, Unspecified jaundice                           R79.89, Other specified abnormal findings of blood                            chemistry                           R93.5, Abnormal findings on diagnostic imaging of                            other abdominal regions, including retroperitoneum                           R93.2, Abnormal findings on diagnostic imaging of                            liver and biliary tract CPT copyright 2022 American Medical Association. All rights reserved. The codes documented in this report are preliminary and upon coder review may  be revised to meet current compliance requirements. Corliss Parish, MD 11/08/2022 9:34:03 AM Number of Addenda: 0

## 2022-11-08 NOTE — H&P (View-Only) (Signed)
[name] [facility] November 15, 1945  [id number].    Requesting MD: Dr. Corliss Parish Chief Complaint/Reason for Consult: choledocholithiasis  HPI:  Kari Welch is a 77 yo female who presented yesterday with acute epigastric and RUQ abdominal pain. She has been having intermittent epigastric pain for the last 2 weeks, which usually occurs after eating and is only relieved by vomiting. The pain acutely worsened two nights ago and she came to the ED yesterday. She was found to have mildly elevated LFTs (Tbili 3.0). A RUQ US showed cholelithiasis without signs of acute cholecystitis. A CT scan was suspicious for choledocholithiasis. She was admitted and this morning underwent an ERCP with stone extraction from the CBD by Dr. Meridee Score. Tbili is down to 1.1 today. General surgery has been consulted for cholecystectomy. She is currently not having pain.  Her only prior abdominal surgery is a hysterectomy.  ROS: Review of Systems  Constitutional:  Negative for chills and fever.  Cardiovascular:  Negative for chest pain.  Gastrointestinal:  Positive for abdominal pain, nausea and vomiting.    Family History  Problem Relation Age of Onset   Stroke Mother    Colon cancer Mother 40       dec with colon ca   Heart attack Mother    Migraines Mother    Stroke Father    Heart attack Father    Gallbladder disease Father 39       septic gallbladder   Rheum arthritis Brother        dec age 53 ?Rheumatoid arthritis   Diabetes Maternal Grandmother    Esophageal cancer Neg Hx    Stomach cancer Neg Hx    Rectal cancer Neg Hx     Past Medical History:  Diagnosis Date   Abnormal Pap smear    h/o   Allergy    seasonal   Arthritis    Collagen vascular disease (HCC)    Elevated LFTs    Work up negative - Dr. Juanda Chance   Hyperlipidemia    Hyperplastic colon polyp    Hypertension    Internal hemorrhoids    INTERNAL HEMORRHOIDS 01/24/2009   Qualifier: Diagnosis of  By: Nelson-Smith CMA (AAMA),  Dottie     Migraine    Prothrombin gene mutation (HCC)    heterozygous   PVD (peripheral vascular disease) (HCC)    takes Pletal to open blood flow of blood to fingers, toes, and lower extremities   Raynaud's disease    Vasculitis (HCC)    Venous insufficiency    superficial left leg- seeing a vein specialist in August    Past Surgical History:  Procedure Laterality Date   ABDOMINAL HYSTERECTOMY  1979   TAH/LSO   ABLATION SAPHENOUS VEIN W/ RFA     BREAST BIOPSY Left 10/27/2022   MM LT BREAST BX W LOC DEV 1ST LESION IMAGE BX SPEC STEREO GUIDE 10/27/2022 [facility]   BUNIONECTOMY WITH HAMMERTOE RECONSTRUCTION Left 10/13/2013   Procedure: LEFT FIRST METATARSAL SCARF OSTEOTOMY;  LEFT MODIFIED MCBRIDE BUNIONECTOMY AND SECOND HAMMERTOE CORRECTION;  Surgeon: Toni Arthurs, MD;  Location: [facility];  Service: Orthopedics;  Laterality: Left;   CATARACT EXTRACTION Bilateral 09/2014   Dr. Vonna Kotyk   CERVIX LESION DESTRUCTION  1970   COLONOSCOPY     COLONOSCOPY     DILATION AND CURETTAGE OF UTERUS     LASER ABLATION Bilateral    leg-left 8/14, right 12/14   MASS EXCISION Left    hand   osteopenia of hip  2017   T score -1.1   PELVIC LAPAROSCOPY     d/t infertility   right bartholin's gland cyst excision Right    1990s   sympthectomy Left 2008   hand ([facility])    Social History:  reports that she quit smoking about 34 years ago. Her smoking use included cigarettes. She started smoking about 52 years ago. She has a 9 pack-year smoking history. She has never used smokeless tobacco. She reports current alcohol use of about 2.0 standard drinks of alcohol per week. She reports that she does not use drugs.  Allergies:  Allergies  Allergen Reactions   Methylprednisolone     With IM injection- Panic attacks, trouble with concentration, hyperactive, hyperventilation   Prednisone Swelling    short course=swelling    Medications Prior to Admission  Medication Sig Dispense  Refill   albuterol (PROAIR HFA) 108 (90 Base) MCG/ACT inhaler Inhale 1-2 puffs into the lungs every 6 (six) hours as needed for wheezing or shortness of breath. 18 g 0   ALPRAZolam (XANAX) 0.5 MG tablet Take 1 tablet (0.5 mg total) by mouth every 8 (eight) hours as needed for anxiety. Do not drive for 8 hours after use. 30 tablet 0   amLODipine (NORVASC) 5 MG tablet Take 1 tablet (5 mg total) by mouth 2 (two) times daily. 180 tablet 3   aspirin 81 MG chewable tablet Chew 81 mg by mouth daily.     cilostazol (PLETAL) 100 MG tablet Take 1 tablet (100 mg total) by mouth 2 (two) times daily 180 tablet 3   cyanocobalamin 1000 MCG tablet Take 1,000 mcg by mouth daily.     EPINEPHrine 0.3 mg/0.3 mL IJ SOAJ injection Inject 0.3 mg into the muscle as needed for anaphylaxis. 2 each 0   estradiol (CLIMARA - DOSED IN MG/24 HR) 0.0375 mg/24hr patch Place 1 patch (0.0375 mg total) onto the skin once a week. 12 patch 3   FLUoxetine (PROZAC) 40 MG capsule Take 1 capsule (40 mg total) by mouth daily. 90 capsule 3   HYDROcodone-acetaminophen (NORCO) 10-325 MG tablet TAKE (1) TABLET EVERY FOUR TO SIX HOURS AS NEEDED. Do not drive for 6 hours after use. (Patient taking differently: Take 1 tablet by mouth every 4 (four) hours as needed for moderate pain.) 30 tablet 0   isosorbide mononitrate (IMDUR) 60 MG 24 hr tablet Take 1 tablet (60 mg total) by mouth daily. 90 tablet 3   ondansetron (ZOFRAN-ODT) 4 MG disintegrating tablet Take 1 tablet (4 mg total) by mouth every 8 (eight) hours as needed for nausea or vomiting. 20 tablet 0   potassium chloride SA (K-DUR,KLOR-CON) 20 MEQ tablet TAKE 1 TABLET DAILY. (Patient taking differently: Take 20 mEq by mouth daily.) 30 tablet 0   Rimegepant Sulfate (NURTEC) 75 MG TBDP Take 75 mg by mouth daily as needed. 8 tablet 5   rosuvastatin (CRESTOR) 10 MG tablet Take 1 tablet (10 mg total) by mouth once daily 90 tablet 3   triamterene-hydrochlorothiazide (MAXZIDE-25) 37.5-25 MG tablet  Take 1 tablet by mouth once daily 90 tablet 3     Physical Exam: Blood pressure 108/64, pulse 63, temperature (!) 97.4 F (36.3 C), temperature source Oral, resp. rate 16, height 5\' 1"  (1.549 m), weight 63.5 kg, last menstrual period 03/31/1977, SpO2 91%. General: resting comfortably, appears stated age, no apparent distress Neurological: alert and oriented, no focal deficits HEENT: normocephalic, atraumatic, no scleral icterus Respiratory: normal work of breathing on room air Abdomen: soft, nondistended, nontender  to deep palpation. No surgical scars in the upper abdomen. Extremities: warm and well-perfused, no deformities, moving all extremities spontaneously Psychiatric: normal mood and affect Skin: warm and dry, no jaundice, no rashes or lesions      Assessment/Plan This is a 77 yo female presenting with elevated LFTs and abdominal pain secondary to choledocholithiasis. She is now s/p ERCP with stone extraction. LFTs were already downtrending this morning, and lipase was close to normal at admission. Will tentatively plan for laparoscopic cholecystectomy tomorrow morning. I reviewed the procedure details with the patient, including the benefits and risk. She and her husband expressed understanding, and she agrees to proceed with surgery. All questions were answered. Please keep NPO after midnight tonight.   Sophronia Simas, MD [facility] General, Hepatobiliary and Pancreatic Surgery 11/08/22 10:29 AM

## 2022-11-08 NOTE — Plan of Care (Signed)
°  Problem: Coping: °Goal: Level of anxiety will decrease °Outcome: Progressing °  °

## 2022-11-08 NOTE — Anesthesia Preprocedure Evaluation (Addendum)
Anesthesia Evaluation  Patient identified by MRN, date of birth, ID band Patient awake    Reviewed: Allergy & Precautions, NPO status , Patient's Chart, lab work & pertinent test results  Airway Mallampati: II       Dental  (+) Teeth Intact   Pulmonary COPD, former smoker    + decreased breath sounds      Cardiovascular hypertension, Pt. on medications + Peripheral Vascular Disease   Rhythm:Regular Rate:Normal     Neuro/Psych  Headaches PSYCHIATRIC DISORDERS         GI/Hepatic negative GI ROS, Neg liver ROS,,,  Endo/Other  negative endocrine ROS    Renal/GU Renal disease     Musculoskeletal  (+) Arthritis ,    Abdominal   Peds  Hematology negative hematology ROS (+)   Anesthesia Other Findings   Reproductive/Obstetrics                             Anesthesia Physical Anesthesia Plan  ASA: 3  Anesthesia Plan: General   Post-op Pain Management: Minimal or no pain anticipated   Induction: Intravenous  PONV Risk Score and Plan: 3 and Ondansetron and Treatment may vary due to age or medical condition  Airway Management Planned: Oral ETT  Additional Equipment: None  Intra-op Plan:   Post-operative Plan: Extubation in OR  Informed Consent: I have reviewed the patients History and Physical, chart, labs and discussed the procedure including the risks, benefits and alternatives for the proposed anesthesia with the patient or authorized representative who has indicated his/her understanding and acceptance.     Dental advisory given  Plan Discussed with: CRNA  Anesthesia Plan Comments:        Anesthesia Quick Evaluation

## 2022-11-08 NOTE — Plan of Care (Signed)

## 2022-11-09 ENCOUNTER — Inpatient Hospital Stay (HOSPITAL_COMMUNITY): Payer: Self-pay | Admitting: Certified Registered Nurse Anesthetist

## 2022-11-09 ENCOUNTER — Other Ambulatory Visit (HOSPITAL_BASED_OUTPATIENT_CLINIC_OR_DEPARTMENT_OTHER): Payer: Self-pay

## 2022-11-09 ENCOUNTER — Inpatient Hospital Stay (HOSPITAL_COMMUNITY): Payer: Medicare Other | Admitting: Certified Registered Nurse Anesthetist

## 2022-11-09 ENCOUNTER — Encounter (HOSPITAL_COMMUNITY)
Admission: EM | Disposition: A | Discharge status: Home / Self Care | Payer: Self-pay | Source: Home / Self Care | Attending: Internal Medicine

## 2022-11-09 DIAGNOSIS — I129 Hypertensive chronic kidney disease with stage 1 through stage 4 chronic kidney disease, or unspecified chronic kidney disease: Secondary | ICD-10-CM | POA: Diagnosis not present

## 2022-11-09 DIAGNOSIS — K2950 Unspecified chronic gastritis without bleeding: Secondary | ICD-10-CM | POA: Diagnosis not present

## 2022-11-09 DIAGNOSIS — R1010 Upper abdominal pain, unspecified: Secondary | ICD-10-CM | POA: Diagnosis not present

## 2022-11-09 DIAGNOSIS — N1831 Chronic kidney disease, stage 3a: Secondary | ICD-10-CM | POA: Diagnosis not present

## 2022-11-09 DIAGNOSIS — R112 Nausea with vomiting, unspecified: Secondary | ICD-10-CM | POA: Diagnosis not present

## 2022-11-09 DIAGNOSIS — K8070 Calculus of gallbladder and bile duct without cholecystitis without obstruction: Secondary | ICD-10-CM | POA: Diagnosis not present

## 2022-11-09 DIAGNOSIS — R1011 Right upper quadrant pain: Secondary | ICD-10-CM | POA: Diagnosis not present

## 2022-11-09 DIAGNOSIS — K2289 Other specified disease of esophagus: Secondary | ICD-10-CM | POA: Diagnosis not present

## 2022-11-09 DIAGNOSIS — J439 Emphysema, unspecified: Secondary | ICD-10-CM

## 2022-11-09 DIAGNOSIS — N183 Chronic kidney disease, stage 3 unspecified: Secondary | ICD-10-CM | POA: Diagnosis not present

## 2022-11-09 DIAGNOSIS — R7989 Other specified abnormal findings of blood chemistry: Secondary | ICD-10-CM | POA: Diagnosis not present

## 2022-11-09 DIAGNOSIS — Z87891 Personal history of nicotine dependence: Secondary | ICD-10-CM

## 2022-11-09 DIAGNOSIS — K8050 Calculus of bile duct without cholangitis or cholecystitis without obstruction: Secondary | ICD-10-CM | POA: Diagnosis not present

## 2022-11-09 HISTORY — PX: CHOLECYSTECTOMY: SHX55

## 2022-11-09 LAB — CBC WITH DIFFERENTIAL/PLATELET
Abs Immature Granulocytes: 0.03 10*3/uL (ref 0.00–0.07)
Basophils Absolute: 0 10*3/uL (ref 0.0–0.1)
Basophils Relative: 1 %
Eosinophils Absolute: 0.2 10*3/uL (ref 0.0–0.5)
Eosinophils Relative: 3 %
HCT: 35.8 % — ABNORMAL LOW (ref 36.0–46.0)
Hemoglobin: 11.7 g/dL — ABNORMAL LOW (ref 12.0–15.0)
Immature Granulocytes: 1 %
Lymphocytes Relative: 36 %
Lymphs Abs: 2 10*3/uL (ref 0.7–4.0)
MCH: 34.9 pg — ABNORMAL HIGH (ref 26.0–34.0)
MCHC: 32.7 g/dL (ref 30.0–36.0)
MCV: 106.9 fL — ABNORMAL HIGH (ref 80.0–100.0)
Monocytes Absolute: 0.6 10*3/uL (ref 0.1–1.0)
Monocytes Relative: 11 %
Neutro Abs: 2.7 10*3/uL (ref 1.7–7.7)
Neutrophils Relative %: 48 %
Platelets: 348 10*3/uL (ref 150–400)
RBC: 3.35 MIL/uL — ABNORMAL LOW (ref 3.87–5.11)
RDW: 13.5 % (ref 11.5–15.5)
WBC: 5.6 10*3/uL (ref 4.0–10.5)
nRBC: 0 % (ref 0.0–0.2)

## 2022-11-09 LAB — COMPREHENSIVE METABOLIC PANEL WITH GFR
ALT: 64 U/L — ABNORMAL HIGH (ref 0–44)
AST: 39 U/L (ref 15–41)
Albumin: 2.7 g/dL — ABNORMAL LOW (ref 3.5–5.0)
Alkaline Phosphatase: 209 U/L — ABNORMAL HIGH (ref 38–126)
Anion gap: 10 (ref 5–15)
BUN: 13 mg/dL (ref 8–23)
CO2: 22 mmol/L (ref 22–32)
Calcium: 8.6 mg/dL — ABNORMAL LOW (ref 8.9–10.3)
Chloride: 107 mmol/L (ref 98–111)
Creatinine, Ser: 0.96 mg/dL (ref 0.44–1.00)
GFR, Estimated: >60 mL/min (ref >60)
Glucose, Bld: 105 mg/dL — ABNORMAL HIGH (ref 70–99)
Potassium: 4.5 mmol/L (ref 3.5–5.1)
Sodium: 139 mmol/L (ref 135–145)
Total Bilirubin: 0.4 mg/dL (ref 0.3–1.2)
Total Protein: 6.1 g/dL — ABNORMAL LOW (ref 6.5–8.1)

## 2022-11-09 LAB — PHOSPHORUS: Phosphorus: 3.4 mg/dL (ref 2.5–4.6)

## 2022-11-09 LAB — MAGNESIUM: Magnesium: 1.5 mg/dL — ABNORMAL LOW (ref 1.7–2.4)

## 2022-11-09 LAB — SURGICAL PCR SCREEN
MRSA, PCR: NEGATIVE
Staphylococcus aureus: NEGATIVE

## 2022-11-09 SURGERY — LAPAROSCOPIC CHOLECYSTECTOMY
Anesthesia: General | Site: Abdomen

## 2022-11-09 MED ORDER — HYDROMORPHONE HCL 1 MG/ML IJ SOLN
INTRAMUSCULAR | Status: AC
  Filled 2022-11-09: qty 1

## 2022-11-09 MED ORDER — ACETAMINOPHEN 10 MG/ML IV SOLN
INTRAVENOUS | Status: AC
  Filled 2022-11-09: qty 100

## 2022-11-09 MED ORDER — TRAMADOL HCL 50 MG PO TABS
50.0000 mg | ORAL_TABLET | Freq: Four times a day (QID) | ORAL | 0 refills | Status: AC | PRN
  Filled 2022-11-09: qty 15, 4d supply, fill #0

## 2022-11-09 MED ORDER — PHENYLEPHRINE 80 MCG/ML (10ML) SYRINGE FOR IV PUSH (FOR BLOOD PRESSURE SUPPORT)
PREFILLED_SYRINGE | INTRAVENOUS | Status: DC | PRN
  Administered 2022-11-09: 40 ug via INTRAVENOUS

## 2022-11-09 MED ORDER — BUPIVACAINE HCL 0.25 % IJ SOLN
INTRAMUSCULAR | Status: DC | PRN
  Administered 2022-11-09: 30 mL

## 2022-11-09 MED ORDER — DEXAMETHASONE SODIUM PHOSPHATE 10 MG/ML IJ SOLN
INTRAMUSCULAR | Status: AC
  Filled 2022-11-09: qty 1

## 2022-11-09 MED ORDER — LACTATED RINGERS IV SOLN
INTRAVENOUS | Status: DC | PRN
Start: 2022-11-09 — End: 2022-11-09

## 2022-11-09 MED ORDER — FENTANYL CITRATE (PF) 100 MCG/2ML IJ SOLN
INTRAMUSCULAR | Status: AC
  Filled 2022-11-09: qty 2

## 2022-11-09 MED ORDER — PROPOFOL 10 MG/ML IV BOLUS
INTRAVENOUS | Status: DC | PRN
Start: 2022-11-09 — End: 2022-11-09
  Administered 2022-11-09: 120 mg via INTRAVENOUS

## 2022-11-09 MED ORDER — 0.9 % SODIUM CHLORIDE (POUR BTL) OPTIME
TOPICAL | Status: DC | PRN
  Administered 2022-11-09: 1000 mL

## 2022-11-09 MED ORDER — LIDOCAINE 2% (20 MG/ML) 5 ML SYRINGE
INTRAMUSCULAR | Status: DC | PRN
  Administered 2022-11-09: 60 mg via INTRAVENOUS

## 2022-11-09 MED ORDER — CEFAZOLIN SODIUM-DEXTROSE 2-4 GM/100ML-% IV SOLN
INTRAVENOUS | Status: AC
  Filled 2022-11-09: qty 100

## 2022-11-09 MED ORDER — FENTANYL CITRATE (PF) 100 MCG/2ML IJ SOLN
INTRAMUSCULAR | Status: DC | PRN
  Administered 2022-11-09: 100 ug via INTRAVENOUS
  Administered 2022-11-09 (×2): 50 ug via INTRAVENOUS

## 2022-11-09 MED ORDER — ONDANSETRON HCL 4 MG/2ML IJ SOLN
INTRAMUSCULAR | Status: AC
  Filled 2022-11-09: qty 2

## 2022-11-09 MED ORDER — ONDANSETRON 4 MG PO TBDP
4.0000 mg | ORAL_TABLET | Freq: Three times a day (TID) | ORAL | 0 refills | Status: DC | PRN
  Filled 2022-11-09: qty 20, 7d supply, fill #0

## 2022-11-09 MED ORDER — BUPIVACAINE HCL 0.25 % IJ SOLN
INTRAMUSCULAR | Status: AC
  Filled 2022-11-09: qty 1

## 2022-11-09 MED ORDER — SUGAMMADEX SODIUM 200 MG/2ML IV SOLN
INTRAVENOUS | Status: DC | PRN
  Administered 2022-11-09: 200 mg via INTRAVENOUS

## 2022-11-09 MED ORDER — ACETAMINOPHEN 10 MG/ML IV SOLN
INTRAVENOUS | Status: DC | PRN
  Administered 2022-11-09: 1000 mg via INTRAVENOUS

## 2022-11-09 MED ORDER — AMISULPRIDE (ANTIEMETIC) 5 MG/2ML IV SOLN: 10.0000 mg | Freq: Once | INTRAVENOUS | Status: DC | PRN

## 2022-11-09 MED ORDER — DEXAMETHASONE SODIUM PHOSPHATE 10 MG/ML IJ SOLN
INTRAMUSCULAR | Status: DC | PRN
  Administered 2022-11-09: 4 mg via INTRAVENOUS

## 2022-11-09 MED ORDER — ONDANSETRON HCL 4 MG/2ML IJ SOLN
INTRAMUSCULAR | Status: DC | PRN
  Administered 2022-11-09: 4 mg via INTRAVENOUS

## 2022-11-09 MED ORDER — LIDOCAINE HCL (PF) 2 % IJ SOLN
INTRAMUSCULAR | Status: AC
  Filled 2022-11-09: qty 5

## 2022-11-09 MED ORDER — CEFAZOLIN SODIUM-DEXTROSE 2-4 GM/100ML-% IV SOLN
2.0000 g | INTRAVENOUS | Status: AC
  Administered 2022-11-09: 2 g via INTRAVENOUS

## 2022-11-09 MED ORDER — MAGNESIUM SULFATE 4 GM/100ML IV SOLN
4.0000 g | Freq: Once | INTRAVENOUS | Status: AC
  Administered 2022-11-09: 4 g via INTRAVENOUS
  Filled 2022-11-09: qty 100

## 2022-11-09 MED ORDER — LACTATED RINGERS IR SOLN
Status: DC | PRN
Start: 2022-11-09 — End: 2022-11-09
  Administered 2022-11-09: 1000 mL

## 2022-11-09 MED ORDER — PROPOFOL 10 MG/ML IV BOLUS
INTRAVENOUS | Status: AC
  Filled 2022-11-09: qty 20

## 2022-11-09 MED ORDER — ROCURONIUM BROMIDE 10 MG/ML (PF) SYRINGE
PREFILLED_SYRINGE | INTRAVENOUS | Status: DC | PRN
  Administered 2022-11-09: 50 mg via INTRAVENOUS

## 2022-11-09 MED ORDER — ROCURONIUM BROMIDE 10 MG/ML (PF) SYRINGE
PREFILLED_SYRINGE | INTRAVENOUS | Status: AC
  Filled 2022-11-09: qty 10

## 2022-11-09 MED ORDER — HYDROMORPHONE HCL 1 MG/ML IJ SOLN
0.2500 mg | INTRAMUSCULAR | Status: DC | PRN
  Administered 2022-11-09 (×4): 0.5 mg via INTRAVENOUS

## 2022-11-09 SURGICAL SUPPLY — 46 items
ADH SKN CLS APL DERMABOND .7 (GAUZE/BANDAGES/DRESSINGS) ×1
APL PRP STRL LF DISP 70% ISPRP (MISCELLANEOUS) ×1
APPLIER CLIP 5 13 M/L LIGAMAX5 (MISCELLANEOUS) ×1
APR CLP MED LRG 5 ANG JAW (MISCELLANEOUS) ×1
BAG COUNTER SPONGE SURGICOUNT (BAG) IMPLANT
BAG SPEC RTRVL 10 TROC 200 (ENDOMECHANICALS)
BAG SPNG CNTER NS LX DISP (BAG)
CHLORAPREP W/TINT 26 (MISCELLANEOUS) ×2 IMPLANT
CLIP APPLIE 5 13 M/L LIGAMAX5 (MISCELLANEOUS) ×2 IMPLANT
COVER MAYO STAND XLG (MISCELLANEOUS) ×2 IMPLANT
COVER SURGICAL LIGHT HANDLE (MISCELLANEOUS) ×2 IMPLANT
DERMABOND ADVANCED .7 DNX12 (GAUZE/BANDAGES/DRESSINGS) ×2 IMPLANT
DRAPE C-ARM 42X120 X-RAY (DRAPES) IMPLANT
ELECT L-HOOK LAP 45CM DISP (ELECTROSURGICAL)
ELECT PENCIL ROCKER SW 15FT (MISCELLANEOUS) ×2 IMPLANT
ELECT REM PT RETURN 15FT ADLT (MISCELLANEOUS) ×2 IMPLANT
ELECTRODE L-HOOK LAP 45CM DISP (ELECTROSURGICAL) IMPLANT
GLOVE BIOGEL PI IND STRL 6 (GLOVE) ×2 IMPLANT
GLOVE BIOGEL PI MICRO STRL 5.5 (GLOVE) ×2 IMPLANT
GOWN STRL REUS W/ TWL LRG LVL3 (GOWN DISPOSABLE) ×2 IMPLANT
GOWN STRL REUS W/TWL LRG LVL3 (GOWN DISPOSABLE) ×1
GRASPER SUT TROCAR 14GX15 (MISCELLANEOUS) IMPLANT
HEMOSTAT SNOW SURGICEL 2X4 (HEMOSTASIS) IMPLANT
IRRIG SUCT STRYKERFLOW 2 WTIP (MISCELLANEOUS) ×1
IRRIGATION SUCT STRKRFLW 2 WTP (MISCELLANEOUS) ×2 IMPLANT
KIT BASIN OR (CUSTOM PROCEDURE TRAY) ×2 IMPLANT
KIT TURNOVER KIT A (KITS) IMPLANT
L-HOOK LAP DISP 36CM (ELECTROSURGICAL) ×1
LHOOK LAP DISP 36CM (ELECTROSURGICAL) ×2 IMPLANT
NDL INSUFFLATION 14GA 120MM (NEEDLE) IMPLANT
NEEDLE INSUFFLATION 14GA 120MM (NEEDLE) IMPLANT
POUCH RETRIEVAL ECOSAC 10 (ENDOMECHANICALS) IMPLANT
SCISSORS LAP 5X35 DISP (ENDOMECHANICALS) ×2 IMPLANT
SET CHOLANGIOGRAPH MIX (MISCELLANEOUS) IMPLANT
SET TUBE SMOKE EVAC HIGH FLOW (TUBING) ×2 IMPLANT
SLEEVE Z-THREAD 5X100MM (TROCAR) ×4 IMPLANT
SPIKE FLUID TRANSFER (MISCELLANEOUS) ×2 IMPLANT
SUT MNCRL AB 4-0 PS2 18 (SUTURE) ×2 IMPLANT
SYS BAG RETRIEVAL 10MM (BASKET) ×1
SYSTEM BAG RETRIEVAL 10MM (BASKET) IMPLANT
TOWEL OR 17X26 10 PK STRL BLUE (TOWEL DISPOSABLE) ×2 IMPLANT
TOWEL OR NON WOVEN STRL DISP B (DISPOSABLE) IMPLANT
TRAY LAPAROSCOPIC (CUSTOM PROCEDURE TRAY) ×2 IMPLANT
TROCAR ADV FIXATION 12X100MM (TROCAR) IMPLANT
TROCAR BALLN 12MMX100 BLUNT (TROCAR) IMPLANT
TROCAR Z-THREAD OPTICAL 5X100M (TROCAR) ×2 IMPLANT

## 2022-11-09 NOTE — Transfer of Care (Signed)
Immediate Anesthesia Transfer of Care Note  Patient: Kari Welch  Procedure(s) Performed: LAPAROSCOPIC CHOLECYSTECTOMY (Abdomen)  Patient Location: PACU  Anesthesia Type:General  Level of Consciousness: drowsy and patient cooperative  Airway & Oxygen Therapy: Patient Spontanous Breathing and Patient connected to face mask oxygen  Post-op Assessment: Report given to RN and Post -op Vital signs reviewed and stable  Post vital signs: Reviewed and stable  Last Vitals:  Vitals Value Taken Time  BP 131/71 11/09/22 0842  Temp    Pulse 68 11/09/22 0845  Resp 13 11/09/22 0845  SpO2 96 % 11/09/22 0845  Vitals shown include unfiled device data.  Last Pain:  Vitals:   11/09/22 0631  TempSrc:   PainSc: 0-No pain         Complications: No notable events documented.

## 2022-11-09 NOTE — Progress Notes (Signed)
Gastroenterology Inpatient Follow-up Note   PATIENT IDENTIFICATION  Kari Welch is a 77 y.o. female Hospital Day: 3  SUBJECTIVE  The patient's chart has been reviewed. The patient's labs have been reviewed. The patient has just returned from her cholecystectomy this morning.  She is waking up with some abdominal discomfort but overall feels okay. Patient's husband is at her side. Prior to her procedure she was feeling well this morning with improving urine color.   OBJECTIVE  Scheduled Inpatient Medications:   [MAR Hold] amLODipine  5 mg Oral BID   [MAR Hold] aspirin  81 mg Oral Daily   [MAR Hold] cilostazol  100 mg Oral BID   [MAR Hold] cyanocobalamin  1,000 mcg Oral Daily   [MAR Hold] FLUoxetine  40 mg Oral Daily   [MAR Hold] isosorbide mononitrate  60 mg Oral Daily   [MAR Hold] pantoprazole (PROTONIX) IV  40 mg Intravenous Q24H   Continuous Inpatient Infusions:   0.9 % NaCl with KCl 20 mEq / L 75 mL/hr at 11/09/22 0128   ceFAZolin      ceFAZolin (ANCEF) IV     PRN Inpatient Medications: [MAR Hold] acetaminophen **OR** [MAR Hold] acetaminophen, [MAR Hold] ALPRAZolam, ceFAZolin, [MAR Hold]  HYDROmorphone (DILAUDID) injection, [MAR Hold] ondansetron **OR** [MAR Hold] ondansetron (ZOFRAN) IV, [MAR Hold] oxyCODONE   Physical Examination  Temp:  [97.4 F (36.3 C)-98.2 F (36.8 C)] 97.6 F (36.4 C) (08/11 0631) Pulse Rate:  [58-94] 58 (08/11 0658) Resp:  [14-21] 16 (08/11 0658) BP: (95-127)/(55-104) 121/71 (08/11 0652) SpO2:  [88 %-98 %] 88 % (08/11 0658) Weight:  [63.5 kg] 63.5 kg (08/10 0801) Temp (24hrs), Avg:97.8 F (36.6 C), Min:97.4 F (36.3 C), Max:98.2 F (36.8 C)  Weight: 63.5 kg GEN: Looks tired, no acute distress, husband at bedside PSYCH: Cooperative, without pressured speech EYE: Conjunctivae pink, sclerae anicteric ENT: MMM CV: Nontachycardic RESP: No audible wheezing GI: Without rebound  MSK/EXT: No significant lower extremity edema SKIN: No  jaundice NEURO:  Alert & Oriented x 3, no focal deficits   Review of Data   Laboratory Studies   Recent Labs  Lab 11/07/22 1148 11/08/22 0538  NA 135 138  K 3.4* 3.9  CL 97* 106  CO2 24 21*  BUN 15 11  CREATININE 1.18* 1.05*  GLUCOSE 109* 97  CALCIUM 9.4 8.4*  MG 1.8  --   PHOS 3.6  --    Recent Labs  Lab 11/08/22 0538  AST 91*  ALT 96*  ALKPHOS 285*    Recent Labs  Lab 11/07/22 1148 11/08/22 0538 11/09/22 0525  WBC 9.6 5.6 5.6  HGB 14.3 12.7 11.7*  HCT 42.2 39.0 35.8*  PLT 403* 344 348   Recent Labs  Lab 11/08/22 0538  INR 1.1   Imaging Studies  DG ERCP  Result Date: 11/08/2022 CLINICAL DATA:  Biliary dilatation. Jaundice. Abnormal liver function test. EXAM: ERCP TECHNIQUE: Multiple spot images obtained with the fluoroscopic device and submitted for interpretation post-procedure. FLUOROSCOPY: Radiation Exposure Index (as provided by the fluoroscopic device): 21.29 mGy COMPARISON:  CT abdomen and pelvis 11/07/2022 FINDINGS: Retrograde cholangiogram was performed. Mild dilatation of the biliary system with poorly defined filling defects in the distal common bile duct. Findings are suggestive for stones or sludge. Evidence for a balloon sweep. IMPRESSION: Choledocholithiasis with mild biliary dilatation. These images were submitted for radiologic interpretation only. Electronically Signed   By: Richarda Overlie M.D.   On: 11/08/2022 11:32   CT ABDOMEN PELVIS W CONTRAST  Result Date: 11/07/2022 CLINICAL DATA:  Elevated liver enzymes. Concern for biliary obstruction. EXAM: CT ABDOMEN AND PELVIS WITH CONTRAST TECHNIQUE: Multidetector CT imaging of the abdomen and pelvis was performed using the standard protocol following bolus administration of intravenous contrast. RADIATION DOSE REDUCTION: This exam was performed according to the departmental dose-optimization program which includes automated exposure control, adjustment of the mA and/or kV according to patient size and/or  use of iterative reconstruction technique. CONTRAST:  OMNIPAQUE IOHEXOL 300 MG/ML  SOLN COMPARISON:  CT abdomen pelvis dated 01/24/2009 and ultrasound dated 11/07/2022. FINDINGS: Lower chest: Minimal bibasilar subpleural atelectasis. The visualized lung bases are otherwise clear. There is background of mild emphysema. No intra-abdominal free air or free fluid. Hepatobiliary: There is slight irregularity of the liver contour. Clinical correlation recommended to evaluate for possibility of early cirrhosis. There multiple stones within the gallbladder. There is mild dilatation of the intrahepatic biliary tree as well as mild dilatation of the common bile duct. The common bile duct measures up to 12 mm in diameter. Several stones noted in the central CBD at the head of the pancreas. Pancreas: Unremarkable. No pancreatic ductal dilatation or surrounding inflammatory changes. Spleen: Normal in size without focal abnormality. Adrenals/Urinary Tract: The adrenal glands are unremarkable. Mild left renal parenchyma atrophy and cortical scarring. There is no hydronephrosis on either side. There is symmetric enhancement and excretion of contrast by both kidneys. The visualized ureters and urinary bladder appear unremarkable. Stomach/Bowel: There is no bowel obstruction or active inflammation. The appendix is not visualized with certainty. No inflammatory changes identified in the right lower quadrant. Vascular/Lymphatic: Moderate aortoiliac atherosclerotic disease. The IVC is unremarkable. No portal venous gas. There is no adenopathy. Reproductive: Hysterectomy.  No adnexal masses. Other: None Musculoskeletal: Degenerative changes of the spine. No acute osseous pathology. IMPRESSION: 1. Choledocholithiasis with mild intrahepatic and extrahepatic biliary ductal dilatation. Further evaluation with MRCP or ERCP is recommended. 2. Cholelithiasis. 3. No bowel obstruction. 4. Aortic Atherosclerosis (ICD10-I70.0) and Emphysema  (ICD10-J43.9). Electronically Signed   By: Elgie Collard M.D.   On: 11/07/2022 16:44   US Abdomen Limited  Result Date: 11/07/2022 CLINICAL DATA:  Pain right upper quadrant x2 weeks EXAM: ULTRASOUND ABDOMEN LIMITED RIGHT UPPER QUADRANT COMPARISON:  None Available. FINDINGS: Gallbladder: Echogenic structures are seen in the dependent portion of the gallbladder suggesting sludge and stones. Technologist did not observe any tenderness over the gallbladder. There is no fluid around the gallbladder. Common bile duct: Diameter: 4 mm Liver: No focal lesion identified. Within normal limits in parenchymal echogenicity. Portal vein is patent on color Doppler imaging with normal direction of blood flow towards the liver. Other: None. IMPRESSION: Sludge and stones are seen in gallbladder. There are no signs of acute cholecystitis. There is no dilation of bile ducts. Electronically Signed   By: Ernie Avena M.D.   On: 11/07/2022 14:12    GI Procedures and Studies  ERCP - No gross lesions in the entire esophagus. Z-line irregular, 40 cm from the incisors. - 2 cm hiatal hernia. - Erythematous mucosa in the stomach. Biopsied. - No gross lesions in the duodenal bulb, in the first portion of the duodenum and in the second portion of the duodenum. - The major papilla was adjacent to a diverticulum. Otherwise it was normal. - Filling defects consistent with stones and sludge were seen on the cholangiogram. - The entire main bile duct was mildly dilated. - Choledocholithiasis was found. Complete removal was accomplished by biliary sphincterotomy and balloon trawl.  ASSESSMENT  Ms. Chasey is a 77 y.o. female with a pmh significant for Raynaud's, alpha gal, PVD, migraines, hemorrhoids, colon polyps, cholelithiasis.  Patient admitted with abdominal pain and abnormal liver tests with findings of choledocholithiasis status post ERCP.  The patient is now status post ERCP and status postcholecystectomy for definitive  treatment of choledocholithiasis and cholelithiasis symptomatic.  From a GI standpoint we are signing off at this time and we will follow-up the pathology in case there is anything that pops up from that.  She may not require immediate GI follow-up unless something else develops or changes.  Will update them when the pathology returns.   PLAN/RECOMMENDATIONS  Trend LFTs while in-house Appreciate surgical service with cholecystectomy Appreciate medical service with care of patient   We will sign off at this time, but please page/call with questions or concerns.   Corliss Parish, MD [facility] Office # [phone]    LOS: 2 days  Lemar Lofty  11/09/2022, 7:00 AM

## 2022-11-09 NOTE — Interval H&P Note (Signed)
History and Physical Interval Note:  11/09/2022 6:53 AM  Kari Welch  has presented today for surgery, with the diagnosis of CHOLELITHIASIS.  The various methods of treatment have been discussed with the patient and family. After consideration of risks, benefits and other options for treatment, the patient has consented to  Procedure(s): LAPAROSCOPIC CHOLECYSTECTOMY (N/A) as a surgical intervention.  The patient's history has been reviewed, patient examined, no change in status, stable for surgery. No abdominal pain this morning. I have reviewed the patient's chart and labs.  Risks and benefits of surgery were reviewed, including risks of bleeding, infection, and <0.5% risk of common bile duct injury. Questions were answered to the patient's satisfaction.     Fritzi Mandes

## 2022-11-09 NOTE — Anesthesia Postprocedure Evaluation (Signed)
Anesthesia Post Note  Patient: Kari Welch  Procedure(s) Performed: LAPAROSCOPIC CHOLECYSTECTOMY (Abdomen)     Patient location during evaluation: PACU Anesthesia Type: General Level of consciousness: sedated and patient cooperative Pain management: pain level controlled Vital Signs Assessment: post-procedure vital signs reviewed and stable Respiratory status: spontaneous breathing Cardiovascular status: stable Anesthetic complications: no   No notable events documented.  Last Vitals:  Vitals:   11/09/22 0930 11/09/22 0946  BP: 111/62 (!) 118/56  Pulse: (!) 56 63  Resp: 16 18  Temp:    SpO2: 95% 93%    Last Pain:  Vitals:   11/09/22 0930  TempSrc:   PainSc: 3                  Lewie Loron

## 2022-11-09 NOTE — Anesthesia Procedure Notes (Signed)
Procedure Name: Intubation Date/Time: 11/09/2022 7:45 AM  Performed by: Epimenio Sarin, CRNAPre-anesthesia Checklist: Patient identified, Emergency Drugs available, Suction available, Patient being monitored and Timeout performed Patient Re-evaluated:Patient Re-evaluated prior to induction Oxygen Delivery Method: Circle system utilized Preoxygenation: Pre-oxygenation with 100% oxygen Induction Type: IV induction Ventilation: Mask ventilation without difficulty and Oral airway inserted - appropriate to patient size Laryngoscope Size: Mac and 3 Grade View: Grade I Tube type: Oral Tube size: 7.0 mm Number of attempts: 1 Airway Equipment and Method: Stylet Placement Confirmation: ETT inserted through vocal cords under direct vision, positive ETCO2 and breath sounds checked- equal and bilateral Secured at: 21 cm Tube secured with: Tape Dental Injury: Teeth and Oropharynx as per pre-operative assessment

## 2022-11-09 NOTE — Op Note (Signed)
Date: 11/09/22  Patient: Kari Welch MRN: [id number]  Preoperative Diagnosis: Cholelithiasis, choledocholithiasis Postoperative Diagnosis: Same  Procedure: Laparoscopic cholecystectomy  Surgeon: Sophronia Simas, MD  EBL: 50 mL  Anesthesia: General endotracheal  Specimens: Gallbladder  Indications: Ms. [name] is a 76 yo female who presented with worsening epigastric and RUQ abdominal, and elevated LFTs. Imaging workup showed cholelithiasis with choledocholithiasis. She underwent an ERCP with stone extraction yesterday, and LFTs were downtrending prior to the procedure. After a discussion of the risks and benefits of surgery, she agreed to proceed with cholecystectomy.  Findings: Cholelithiasis and signs of chronic cholecystitis.  Procedure details: Informed consent was obtained in the preoperative area prior to the procedure. The patient was brought to the operating room and placed on the table in the supine position. General anesthesia was induced and appropriate lines and drains were placed for intraoperative monitoring. Perioperative antibiotics were administered per SCIP guidelines. The abdomen was prepped and draped in the usual sterile fashion. A pre-procedure timeout was taken verifying patient identity, surgical site and procedure to be performed.  A small infraumbilical skin incision was made, the subcutaneous tissue was divided with cautery, and the umbilical stalk was grasped and elevated. The fascia was incised and the peritoneal cavity was directly visualized. A 12mm Hassan trocar was placed and the abdomen was insufflated. The peritoneal cavity was inspected with no evidence of visceral or vascular injury. Three 5mm ports were placed in the right subcostal margin, all under direct visualization. The fundus of the gallbladder was grasped and retracted cephalad. There were omental adhesions to the gallbladder, which were carefully taken down with cautery and blunt dissection. The  infundibulum was then retracted laterally. The gallbladder wall appeared mildly thickened and inflamed, consistent with chronic cholecystitis. The peritoneum overlying the gallbladder was incised with cautery and the cystic triangle was dissected out using cautery and blunt dissection. The critical view of safety was obtained, and the cystic duct and cystic artery were clipped and ligated, leaving two clips behind on the cystic duct stump. The gallbladder was taken off the liver using cautery, and the specimen was placed in an endocatch bag. The surgical site was irrigated with saline until the effluent was clear. Hemostasis was achieved in the gallbladder fossa using cautery. The cystic duct and artery stumps were visually inspected and there was no evidence of bile leak or bleeding. The ports were removed under direct visualization and the abdomen was desufflated. The specimen was extracted via the umbilical port site. The umbilical port site fascia was closed with a 0 vicryl pursestring suture. The skin at all port sites was closed with 4-0 monocryl subcuticular suture. Dermabond was applied.  The patient tolerated the procedure well with no apparent complications. All counts were correct x2 at the end of the procedure. The patient was extubated and taken to PACU in stable condition.  Sophronia Simas, MD 11/09/22 8:39 AM

## 2022-11-09 NOTE — Discharge Summary (Signed)
Physician Discharge Summary   Patient: Kari Welch MRN: [id number] DOB: [birth year]  Admit date:     11/07/2022  Discharge date: 11/11/22  Discharge Physician: Marguerita Merles, DO   PCP: Shelva Majestic, MD   Recommendations at discharge:   Follow-up with PCP within 1 to 2 weeks repeat CBC, CMP, mag, Phos within 1 week Follow-up with gastroenterology within 1 to 2 weeks if needed Follow-up with General Surgery in about 2 to 3 weeks and they will call and schedule an appointment for follow-up  Discharge Diagnoses: Principal Problem:   Obstructive hyperbilirubinemia Active Problems:   Hyperlipidemia   Essential hypertension   Intermittent claudication (HCC)   Paroxysmal digital cyanosis   CKD (chronic kidney disease), stage III (HCC)   Emphysema lung (HCC)   Choledocholithiasis   Abnormal LFTs   Abdominal pain, chronic, right upper quadrant   Intractable nausea and vomiting  Resolved Problems:   * No resolved hospital problems. [facility] Course: The patient is a 77 year old Caucasian female with past medical history significant for vomiting abnormal Pap smears, seasonal allergies, osteoarthritis, collagen vascular disease, previously elevated LFTs with negative workup, hyperlipidemia, hyperplastic colonic polyp, hypertension, internal hemorrhoids, migraine headaches, heterozygous prothrombin gene mutation, peripheral vascular disease, Raynaud's disease, vasculitis, venous insufficiency as well as other comorbidities who presented to the emergency room with complaints of abdominal discomfort, nausea and emesis that roughly started an hour after she eats for last few weeks.  The pain was in the epigastric right upper quadrant area that radiated to her back and felt like it was a "band going around" and then pressure in her abdomen.  She states that this abdominal discomfort was usually relieved by vomiting and to yesterday she had not eaten much in the last 2 days and subsequently  her symptoms felt a lot better but she ate some chicken Parmesan yesterday evening and since her symptoms reappeared.  Given her symptoms she was brought to the ED and worked up and was found to have sludge and stones in the gallbladder with no acute signs of cholecystitis and no dilation of the bile duct.  She underwent CT scan imaging and was found to have elevated LFTs and showed a progressive biliary duct dilatation with some slight liver contour irregularity.  She underwent ERCP and was found to have choledocholithiasis and general surgery's been consulted for surgical intervention and plan is for cholecystectomy at some time tomorrow.  Assessment and Plan:  Abnormal LFTs Obstructive hyperbilirubinemia in the setting of choledocholithiasis Abdominal pain, chronic, right upper quadrant Intractable nausea and vomiting -She is admitted to inpatient/MedSurg. -Continue IV fluids and resumed normal saline with 20 mill colons of KCl at 75 mL/h for another 20 hours -Clear liquid diet advance to heart healthy diet per gastroenterology will be made n.p.o. after midnight -Liver Function Trend: Recent Labs  Lab 11/07/22 1148 11/08/22 0538 11/09/22 0525  AST 206* 91* 39  ALT 139* 96* 64*  BILITOT 3.0* 1.1 0.4  ALKPHOS 394* 285* 209*  -C/w Analgesics as needed with acetaminophen 650 mg p.o./RC every 6 as needed for mild pain or fever, oxycodone 5 mg every 4 as needed for moderate pain and IV hydromorphone 0.5 mg every 2 as needed severe pain -Continue with antiemetics as needed with ondansetron 4 mg p.o./IV every 6 as needed for nausea -Pantoprazole 40 mg IVP daily. -Follow CBC, CMP and lipase in AM. -Gastroenterology consult appreciated and she was taken for ERCP and was found to have choledocholithiasis that were  completely removed by biliary center anatomy and balloon withdrawal -After the patient's ERCP, General Surgery was consulted and took the patient for laparoscopic cholecystectomy and she  was stable after this and general surgery felt we could discharge her home safely and she will need to follow-up with PCP and gastroenterology and general surgery outpatient setting   Hyperlipidemia -Hold statin due to abnormal LFTs for least a week  Thrombocytosis  -Likely reactive in the setting of above -Platelet count trend: Recent Labs  Lab 11/07/22 1148 11/08/22 0538 11/09/22 0525  PLT 403* 344 348  -Continue to Monitor and Trend and repeat CBC within 1 week   Essential Hypertension -Continue Amlodipine 5 mg p.o. daily. -Continue to hold diuretic due to volume depletion and mild Hypotension -Renewed IV fluid hydration -Continue to monitor blood pressures per protocol  -Last blood pressure reading was 100/57  Hypomagnesemia -Patient's Mag Level Trend: Recent Labs  Lab 11/07/22 1148 11/09/22 0525  MG 1.8 1.5*  -Replete with IV Mag Sulfate 4 grams prior to D/C -Continue to Monitor and Replete as Necessary -Repeat Mag Level within 1 week   Intermittent claudication (HCC) Paroxysmal digital cyanosis -Continue cilostazol 100 mg p.o. twice daily.   CKD (chronic kidney disease), stage IIIa (HCC) Metabolic Acidosis -BUN/Cr Trend: Recent Labs  Lab 11/07/22 1148 11/08/22 0538 11/09/22 0525  BUN 15 11 13   CREATININE 1.18* 1.05* 0.96  -Patient had a slight metabolic acidosis with a CO2 of 21, anion gap of 11, chloride level 106; now CO2 is 22, anion gap is 10, chloride level is 107 -Avoid Nephrotoxic Medications, Contrast Dyes, Hypotension and Dehydration to Ensure Adequate Renal Perfusion and will need to Renally Adjust Meds -Continue to Monitor and Trend Renal Function carefully and repeat CMP within 1 week   Emphysema lung (HCC) -Has not used her inhaler in a while. -Continue Short acting bronchodilators as needed. -Continue to Monitor Respiratory Status Carefully and she is stable and can be discharged  Hypoalbuminemia -Patient's Albumin Trend: Recent Labs   Lab 11/07/22 1148 11/08/22 0538 11/09/22 0525  ALBUMIN 3.6 2.9* 2.7*  -Continue to Monitor and Trend and repeat CMP within 1 week  Consultants: General Surgery, Gastroenterology Procedures performed: As delineated as above  Disposition: Home Diet recommendation:  Cardiac diet DISCHARGE MEDICATION: Allergies as of 11/09/2022       Reactions   Methylprednisolone    With IM injection- Panic attacks, trouble with concentration, hyperactive, hyperventilation   Prednisone Swelling   short course=swelling        Medication List     STOP taking these medications    HYDROcodone-acetaminophen 10-325 MG tablet Commonly known as: NORCO       TAKE these medications    albuterol 108 (90 Base) MCG/ACT inhaler Commonly known as: ProAir HFA Inhale 1-2 puffs into the lungs every 6 (six) hours as needed for wheezing or shortness of breath.   ALPRAZolam 0.5 MG tablet Commonly known as: XANAX Take 1 tablet (0.5 mg total) by mouth every 8 (eight) hours as needed for anxiety. Do not drive for 8 hours after use.   amLODipine 5 MG tablet Commonly known as: NORVASC Take 1 tablet (5 mg total) by mouth 2 (two) times daily.   aspirin 81 MG chewable tablet Chew 81 mg by mouth daily.   cilostazol 100 MG tablet Commonly known as: PLETAL Take 1 tablet (100 mg total) by mouth 2 (two) times daily   cyanocobalamin 1000 MCG tablet Take 1,000 mcg by mouth daily.   EPINEPHrine  0.3 mg/0.3 mL Soaj injection Commonly known as: EPI-PEN Inject 0.3 mg into the muscle as needed for anaphylaxis.   estradiol 0.0375 mg/24hr patch Commonly known as: CLIMARA - Dosed in mg/24 hr Place 1 patch (0.0375 mg total) onto the skin once a week.   FLUoxetine 40 MG capsule Commonly known as: PROZAC Take 1 capsule (40 mg total) by mouth daily.   isosorbide mononitrate 60 MG 24 hr tablet Commonly known as: IMDUR Take 1 tablet (60 mg total) by mouth daily.   Nurtec 75 MG Tbdp Generic drug: Rimegepant  Sulfate Take 75 mg by mouth daily as needed.   ondansetron 4 MG disintegrating tablet Commonly known as: ZOFRAN-ODT Take 1 tablet (4 mg total) by mouth every 8 (eight) hours as needed for nausea or vomiting.   potassium chloride SA 20 MEQ tablet Commonly known as: KLOR-CON M TAKE 1 TABLET DAILY.   rosuvastatin 10 MG tablet Commonly known as: CRESTOR Take 1 tablet (10 mg total) by mouth once daily   traMADol 50 MG tablet Commonly known as: Ultram Take 1 tablet (50 mg total) by mouth every 6 (six) hours as needed for up to 5 days for severe pain.   triamterene-hydrochlorothiazide 37.5-25 MG tablet Commonly known as: MAXZIDE-25 Take 1 tablet by mouth once daily        Discharge Exam: Filed Weights   11/07/22 1146 11/08/22 0801  Weight: 63.5 kg 63.5 kg   Vitals:   11/09/22 1258 11/09/22 1259  BP: (!) 106/59   Pulse: 64   Resp: 18   Temp: 97.7 F (36.5 C)   SpO2: (!) 87% 97%   Examination: Physical Exam:  Constitutional: WN/WD overweight Caucasian female in NAD Respiratory: Diminished to auscultation bilaterally, no wheezing, rales, rhonchi or crackles. Normal respiratory effort and patient is not tachypenic. No accessory muscle use. Unlabored breathing  Cardiovascular: RRR, no murmurs / rubs / gallops. S1 and S2 auscultated. No extremity edema.  Abdomen: Soft, non-tender, Distended 2/2 body habitus. Bowel sounds positive.  GU: Deferred. Musculoskeletal: No clubbing / cyanosis of digits/nails. No joint deformity upper and lower extremities.  Skin: No rashes, lesions, ulcers on a limited skin evaluation. No induration; Warm and dry.  Neurologic: CN 2-12 grossly intact with no focal deficits. Romberg sign and cerebellar reflexes not assessed.  Psychiatric: Normal judgment and insight. Alert and oriented x 3. Normal mood and appropriate affect.   Condition at discharge: stable  The results of significant diagnostics from this hospitalization (including imaging,  microbiology, ancillary and laboratory) are listed below for reference.   Imaging Studies: DG ERCP  Result Date: 11/08/2022 CLINICAL DATA:  Biliary dilatation. Jaundice. Abnormal liver function test. EXAM: ERCP TECHNIQUE: Multiple spot images obtained with the fluoroscopic device and submitted for interpretation post-procedure. FLUOROSCOPY: Radiation Exposure Index (as provided by the fluoroscopic device): 21.29 mGy COMPARISON:  CT abdomen and pelvis 11/07/2022 FINDINGS: Retrograde cholangiogram was performed. Mild dilatation of the biliary system with poorly defined filling defects in the distal common bile duct. Findings are suggestive for stones or sludge. Evidence for a balloon sweep. IMPRESSION: Choledocholithiasis with mild biliary dilatation. These images were submitted for radiologic interpretation only. Electronically Signed   By: Richarda Overlie M.D.   On: 11/08/2022 11:32   CT ABDOMEN PELVIS W CONTRAST  Result Date: 11/07/2022 CLINICAL DATA:  Elevated liver enzymes. Concern for biliary obstruction. EXAM: CT ABDOMEN AND PELVIS WITH CONTRAST TECHNIQUE: Multidetector CT imaging of the abdomen and pelvis was performed using the standard protocol following bolus administration of  intravenous contrast. RADIATION DOSE REDUCTION: This exam was performed according to the departmental dose-optimization program which includes automated exposure control, adjustment of the mA and/or kV according to patient size and/or use of iterative reconstruction technique. CONTRAST:  OMNIPAQUE IOHEXOL 300 MG/ML  SOLN COMPARISON:  CT abdomen pelvis dated 01/24/2009 and ultrasound dated 11/07/2022. FINDINGS: Lower chest: Minimal bibasilar subpleural atelectasis. The visualized lung bases are otherwise clear. There is background of mild emphysema. No intra-abdominal free air or free fluid. Hepatobiliary: There is slight irregularity of the liver contour. Clinical correlation recommended to evaluate for possibility of early  cirrhosis. There multiple stones within the gallbladder. There is mild dilatation of the intrahepatic biliary tree as well as mild dilatation of the common bile duct. The common bile duct measures up to 12 mm in diameter. Several stones noted in the central CBD at the head of the pancreas. Pancreas: Unremarkable. No pancreatic ductal dilatation or surrounding inflammatory changes. Spleen: Normal in size without focal abnormality. Adrenals/Urinary Tract: The adrenal glands are unremarkable. Mild left renal parenchyma atrophy and cortical scarring. There is no hydronephrosis on either side. There is symmetric enhancement and excretion of contrast by both kidneys. The visualized ureters and urinary bladder appear unremarkable. Stomach/Bowel: There is no bowel obstruction or active inflammation. The appendix is not visualized with certainty. No inflammatory changes identified in the right lower quadrant. Vascular/Lymphatic: Moderate aortoiliac atherosclerotic disease. The IVC is unremarkable. No portal venous gas. There is no adenopathy. Reproductive: Hysterectomy.  No adnexal masses. Other: None Musculoskeletal: Degenerative changes of the spine. No acute osseous pathology. IMPRESSION: 1. Choledocholithiasis with mild intrahepatic and extrahepatic biliary ductal dilatation. Further evaluation with MRCP or ERCP is recommended. 2. Cholelithiasis. 3. No bowel obstruction. 4. Aortic Atherosclerosis (ICD10-I70.0) and Emphysema (ICD10-J43.9). Electronically Signed   By: Elgie Collard M.D.   On: 11/07/2022 16:44   US Abdomen Limited  Result Date: 11/07/2022 CLINICAL DATA:  Pain right upper quadrant x2 weeks EXAM: ULTRASOUND ABDOMEN LIMITED RIGHT UPPER QUADRANT COMPARISON:  None Available. FINDINGS: Gallbladder: Echogenic structures are seen in the dependent portion of the gallbladder suggesting sludge and stones. Technologist did not observe any tenderness over the gallbladder. There is no fluid around the gallbladder.  Common bile duct: Diameter: 4 mm Liver: No focal lesion identified. Within normal limits in parenchymal echogenicity. Portal vein is patent on color Doppler imaging with normal direction of blood flow towards the liver. Other: None. IMPRESSION: Sludge and stones are seen in gallbladder. There are no signs of acute cholecystitis. There is no dilation of bile ducts. Electronically Signed   By: Ernie Avena M.D.   On: 11/07/2022 14:12   MM LT BREAST BX W LOC DEV 1ST LESION IMAGE BX SPEC STEREO GUIDE  Addendum Date: 10/29/2022   ADDENDUM REPORT: 10/29/2022 09:38 ADDENDUM: Pathology revealed BENIGN BREAST TISSUE WITH FIBROCYSTIC CHANGE, COLUMNAR CELL CHANGE, USUAL DUCTAL HYPERPLASIA AND ASSOCIATED CALCIFICATIONS of the LEFT breast, upper inner, (coil clip). This was found to be concordant by Dr. Annia Belt. Pathology results were discussed with the patient and her husband by telephone. The patient reported doing well after the biopsy with minimal tenderness at the site. Post biopsy instructions and care were reviewed and questions were answered. The patient was encouraged to call [facility] for any additional concerns. The patient was instructed to return for annual screening mammography in July 2025. Pathology results reported by Rene Kocher, RN on 10/28/2022. Electronically Signed   By: Annia Belt M.D.   On:  10/29/2022 09:38   Result Date: 10/29/2022 CLINICAL DATA:  Indeterminate left breast calcifications EXAM: LEFT BREAST STEREOTACTIC CORE NEEDLE BIOPSY COMPARISON:  Previous exam(s). FINDINGS: The patient and I discussed the procedure of stereotactic-guided biopsy including benefits and alternatives. We discussed the high likelihood of a successful procedure. We discussed the risks of the procedure including infection, bleeding, tissue injury, clip migration, and inadequate sampling. Informed written consent was given. The usual time out protocol was performed immediately  prior to the procedure. Using sterile technique and 1% Lidocaine as local anesthetic, under stereotactic guidance, a 9 gauge vacuum assisted device was used to perform core needle biopsy of calcifications upper inner left breast using a cranial approach. Specimen radiograph was performed showing calcifications. Specimens with calcifications are identified for pathology. Lesion quadrant: Upper inner quadrant At the conclusion of the procedure, coil shaped tissue marker clip was deployed into the biopsy cavity. Follow-up 2-view mammogram was performed and dictated separately. IMPRESSION: Stereotactic-guided biopsy of left breast calcifications. No apparent complications. Electronically Signed: By: Annia Belt M.D. On: 10/27/2022 12:04   MM CLIP PLACEMENT LEFT  Result Date: 10/27/2022 CLINICAL DATA:  Indeterminate left breast calcifications EXAM: 3D DIAGNOSTIC LEFT MAMMOGRAM POST STEREOTACTIC BIOPSY COMPARISON:  Previous exam(s). FINDINGS: 3D Mammographic images were obtained following stereotactic guided biopsy of left breast calcifications. The biopsy marking clip is in expected position at the site of biopsy. IMPRESSION: Appropriate positioning of the coil shaped biopsy marking clip at the site of biopsy in the upper inner left breast. Final Assessment: Post Procedure Mammograms for Marker Placement Electronically Signed   By: Annia Belt M.D.   On: 10/27/2022 12:05   MM 3D DIAGNOSTIC MAMMOGRAM BILATERAL BREAST  Result Date: 10/22/2022 CLINICAL DATA:  Follow-up for calcifications in the LEFT breast. History of bilateral breast cysts. EXAM: DIGITAL DIAGNOSTIC BILATERAL MAMMOGRAM WITH TOMOSYNTHESIS AND CAD TECHNIQUE: Bilateral digital diagnostic mammography and breast tomosynthesis was performed. The images were evaluated with computer-aided detection. COMPARISON:  Previous exam(s). ACR Breast Density Category c: The breasts are heterogeneously dense, which may obscure small masses. FINDINGS: There are grouped  punctate and amorphous calcifications within the inner retroareolar LEFT breast, measuring 1.1 cm extent, questionably increased in extent compared to the previous exams, and with a linear and possibly branching distribution. There are no new dominant masses, suspicious calcifications or secondary signs of malignancy elsewhere within either breast. IMPRESSION: Grouped punctate and amorphous calcifications within the inner retroareolar LEFT breast, measuring 1.1 cm extent, now with an indeterminate morphology and distribution. These may represent benign milk of calcium/fibrocystic change. However, due to the indeterminate morphology and distribution, stereotactic biopsy is now recommended to exclude DCIS. RECOMMENDATION: Stereotactic biopsy for the LEFT breast calcifications. Stereotactic biopsy will be scheduled at patient's convenience. I have discussed the findings and recommendations with the patient. If applicable, a reminder letter will be sent to the patient regarding the next appointment. BI-RADS CATEGORY  4: Suspicious. Electronically Signed   By: Bary Richard M.D.   On: 10/22/2022 14:04    Microbiology: Results for orders placed or performed during the hospital encounter of 11/07/22  Surgical pcr screen     Status: None   Collection Time: 11/09/22  1:33 AM   Specimen: Nasal Mucosa; Nasal Swab  Result Value Ref Range Status   MRSA, PCR NEGATIVE NEGATIVE Final   Staphylococcus aureus NEGATIVE NEGATIVE Final    Comment: (NOTE) The Xpert SA Assay (FDA approved for NASAL specimens in patients 65 years of age and older), is one component of  a comprehensive surveillance program. It is not intended to diagnose infection nor to guide or monitor treatment. Performed at [facility], [address] [place], [place] [address]    Labs: CBC: Recent Labs  Lab 11/07/22 1148 11/08/22 0538 11/09/22 0525  WBC 9.6 5.6 5.6  NEUTROABS  --   --  2.7  HGB 14.3 12.7 11.7*  HCT 42.2  39.0 35.8*  MCV 101.7* 107.4* 106.9*  PLT 403* 344 348   Basic Metabolic Panel: Recent Labs  Lab 11/07/22 1148 11/08/22 0538 11/09/22 0525  NA 135 138 139  K 3.4* 3.9 4.5  CL 97* 106 107  CO2 24 21* 22  GLUCOSE 109* 97 105*  BUN 15 11 13   CREATININE 1.18* 1.05* 0.96  CALCIUM 9.4 8.4* 8.6*  MG 1.8  --  1.5*  PHOS 3.6  --  3.4   Liver Function Tests: Recent Labs  Lab 11/07/22 1148 11/08/22 0538 11/09/22 0525  AST 206* 91* 39  ALT 139* 96* 64*  ALKPHOS 394* 285* 209*  BILITOT 3.0* 1.1 0.4  PROT 8.3* 6.3* 6.1*  ALBUMIN 3.6 2.9* 2.7*   CBG: No results for input(s): "GLUCAP" in the last 168 hours.  Discharge time spent: greater than 30 minutes.  Signed: Marguerita Merles, DO [facility] 11/11/2022

## 2022-11-09 NOTE — Discharge Instructions (Signed)
[facility] DISCHARGE INSTRUCTIONS  Activity No heavy lifting greater than 15 pounds for 4 weeks after surgery. Ok to shower in 24 hours, but do not bathe or submerge incisions underwater. Do not drive while taking narcotic pain medication. You may drive when you are no longer taking prescription pain medication, you can comfortably wear a seatbelt, and you can safely maneuver your car and apply brakes.  Wound Care Your incisions are covered with skin glue called Dermabond. This will peel off on its own over time. You may shower and allow warm soapy water to run over your incisions. Gently pat dry. Do not submerge your incision underwater until cleared by your surgeon. Monitor your incision for any new redness, tenderness, or drainage. Many patients will experience some swelling and bruising at the incisions.  Ice packs will help.  Swelling and bruising can take several days to resolve.   Medications A  prescription for pain medication may be given to you upon discharge.  Take your pain medication as prescribed, if needed.  If narcotic pain medicine is not needed, then you may take acetaminophen (Tylenol) or ibuprofen (Advil) as needed. It is common to experience some constipation if taking pain medication after surgery.  Increasing fluid intake and taking a stool softener (such as Colace) will usually help or prevent this problem from occurring.  A mild laxative (Milk of Magnesia or Miralax) should be taken according to package directions if there are no bowel movements after 48 hours. Take your usually prescribed medications unless otherwise directed. If you need a refill on your pain medication, please contact your pharmacy.  They will contact our office to request authorization. Prescriptions will not be filled after 5 pm or on weekends.  When to Call us: Fever greater than 100.5 New redness, drainage, or swelling at incision site Severe pain, nausea, or  vomiting Persistent bleeding from incisions Jaundice (yellowing of the whites of the eyes or skin)  Follow-up You will have a postop appointment with Dr. Freida Busman in 3-4 weeks after surgery. Our office will contact you this week to schedule this appointment, or you may call the number below during regular business hours. This appointment will be at the [facility] office at [address], [address], [place], [place]. Please arrive at least 15 minutes prior to your scheduled appointment time.  IF YOU HAVE DISABILITY OR FAMILY LEAVE FORMS, YOU MUST BRING THEM TO THE OFFICE FOR PROCESSING.   DO NOT GIVE THEM TO YOUR DOCTOR.  The clinic staff is available to answer your questions during regular business hours.  Please don't hesitate to call and ask to speak to one of the nurses for clinical concerns.  If you have a medical emergency, go to the nearest emergency room or call 911.  A surgeon from [facility] is always on call at the hospital  [address], [address], [place], [place]  [address] ?  [address] [address], [place], [place]   [address] [phone] ? Toll Free: [phone] ? FAX [phone] Web site: [email or web address]      Managing Your Pain After Surgery Without Opioids    Thank you for participating in our program to help patients manage their pain after surgery without opioids. This is part of our effort to provide you with the best care possible, without exposing you or your family to the risk that opioids pose.  What pain can I expect after surgery? You can expect to have some pain  after surgery. This is normal. The pain is typically worse the day after surgery, and quickly begins to get better. Many studies have found that many patients are able to manage their pain after surgery with Over-the-Counter (OTC) medications such as Tylenol and Motrin. If you have a condition that does not allow you to take Tylenol or Motrin, notify your  surgical team.  How will I manage my pain? The best strategy for controlling your pain after surgery is around the clock pain control with Tylenol (acetaminophen) and Motrin (ibuprofen or Advil). Alternating these medications with each other allows you to maximize your pain control. In addition to Tylenol and Motrin, you can use heating pads or ice packs on your incisions to help reduce your pain.  How will I alternate your regular strength over-the-counter pain medication? You will take a dose of pain medication every three hours. Start by taking 650 mg of Tylenol (2 pills of 325 mg) 3 hours later take 600 mg of Motrin (3 pills of 200 mg) 3 hours after taking the Motrin take 650 mg of Tylenol 3 hours after that take 600 mg of Motrin.   - 1 -  See example - if your first dose of Tylenol is at 12:00 PM   12:00 PM Tylenol 650 mg (2 pills of 325 mg)  3:00 PM Motrin 600 mg (3 pills of 200 mg)  6:00 PM Tylenol 650 mg (2 pills of 325 mg)  9:00 PM Motrin 600 mg (3 pills of 200 mg)  Continue alternating every 3 hours   We recommend that you follow this schedule around-the-clock for at least 3 days after surgery, or until you feel that it is no longer needed. Use the table on the last page of this handout to keep track of the medications you are taking. Important: Do not take more than 3000mg  of Tylenol or 3200mg  of Motrin in a 24-hour period. Do not take ibuprofen/Motrin if you have a history of bleeding stomach ulcers, severe kidney disease, &/or actively taking a blood thinner  What if I still have pain? If you have pain that is not controlled with the over-the-counter pain medications (Tylenol and Motrin or Advil) you might have what we call "breakthrough" pain. You will receive a prescription for a small amount of an opioid pain medication such as Oxycodone, Tramadol, or Tylenol with Codeine. Use these opioid pills in the first 24 hours after surgery if you have breakthrough pain. Do not  take more than 1 pill every 4-6 hours.  If you still have uncontrolled pain after using all opioid pills, don't hesitate to call our staff using the number provided. We will help make sure you are managing your pain in the best way possible, and if necessary, we can provide a prescription for additional pain medication.   Day 1    Time  Name of Medication Number of pills taken  Amount of Acetaminophen  Pain Level   Comments  AM PM       AM PM       AM PM       AM PM       AM PM       AM PM       AM PM       AM PM       Total Daily amount of Acetaminophen Do not take more than  3,000 mg per day      Day 2    Time  Name of Medication Number of pills taken  Amount of Acetaminophen  Pain Level   Comments  AM PM       AM PM       AM PM       AM PM       AM PM       AM PM       AM PM       AM PM       Total Daily amount of Acetaminophen Do not take more than  3,000 mg per day      Day 3    Time  Name of Medication Number of pills taken  Amount of Acetaminophen  Pain Level   Comments  AM PM       AM PM       AM PM       AM PM         AM PM       AM PM       AM PM       AM PM       Total Daily amount of Acetaminophen Do not take more than  3,000 mg per day      Day 4    Time  Name of Medication Number of pills taken  Amount of Acetaminophen  Pain Level   Comments  AM PM       AM PM       AM PM       AM PM       AM PM       AM PM       AM PM       AM PM       Total Daily amount of Acetaminophen Do not take more than  3,000 mg per day      Day 5    Time  Name of Medication Number of pills taken  Amount of Acetaminophen  Pain Level   Comments  AM PM       AM PM       AM PM       AM PM       AM PM       AM PM       AM PM       AM PM       Total Daily amount of Acetaminophen Do not take more than  3,000 mg per day      Day 6    Time  Name of Medication Number of pills taken  Amount of Acetaminophen  Pain Level   Comments  AM PM       AM PM       AM PM       AM PM       AM PM       AM PM       AM PM       AM PM       Total Daily amount of Acetaminophen Do not take more than  3,000 mg per day      Day 7    Time  Name of Medication Number of pills taken  Amount of Acetaminophen  Pain Level   Comments  AM PM       AM PM       AM PM       AM PM       AM PM  AM PM       AM PM       AM PM       Total Daily amount of Acetaminophen Do not take more than  3,000 mg per day        For additional information about how and where to safely dispose of unused opioid medications - [email or web address]  Disclaimer: This document contains information and/or instructional materials adapted from [facility] for the typical patient with your condition. It does not replace medical advice from your health care provider because your experience may differ from that of the typical patient. Talk to your health care provider if you have any questions about this document, your condition or your treatment plan. Adapted from [facility]

## 2022-11-10 ENCOUNTER — Encounter: Payer: Self-pay | Admitting: Family Medicine

## 2022-11-10 ENCOUNTER — Encounter (HOSPITAL_COMMUNITY): Payer: Self-pay | Admitting: Surgery

## 2022-11-10 ENCOUNTER — Other Ambulatory Visit (HOSPITAL_BASED_OUTPATIENT_CLINIC_OR_DEPARTMENT_OTHER): Payer: Self-pay

## 2022-11-10 ENCOUNTER — Telehealth: Payer: Self-pay

## 2022-11-10 NOTE — Transitions of Care (Post Inpatient/ED Visit) (Unsigned)
   11/10/2022  Name: Kari Welch MRN: [id number] DOB: 11/06/1945  Today's TOC FU Call Status: Today's TOC FU Call Status:: Unsuccessful Call (1st Attempt) Unsuccessful Call (1st Attempt) Date: 11/10/22  Attempted to reach the patient regarding the most recent Inpatient/ED visit.  Follow Up Plan: Additional outreach attempts will be made to reach the patient to complete the Transitions of Care (Post Inpatient/ED visit) call.   Signature   Woodfin Ganja LPN [facility] Nurse Health Advisor Direct Dial [phone]

## 2022-11-10 NOTE — Transitions of Care (Post Inpatient/ED Visit) (Signed)
11/10/2022  Name: Kari Welch MRN: [id number] DOB: [birth year]  Today's TOC FU Call Status: Today's TOC FU Call Status:: Successful TOC FU Call Completed TOC FU Call Complete Date: 11/10/22  Transition Care Management Follow-up Telephone Call Date of Discharge: 11/09/22 Discharge Facility: [facility]) Type of Discharge: Inpatient Admission Primary Inpatient Discharge Diagnosis:: "obstructive hyperbilirubinemia" How have you been since you were released from the hospital?: Better (Pt pleased to report how well she is doing-pain manged with Tylenol-current level 1 or 2/10, no N&V, chicken noodle soup for lunch, LBM yesterday. Surg incison looks good-no s/s infection) Any questions or concerns?: No  Items Reviewed: Did you receive and understand the discharge instructions provided?: Yes Medications obtained,verified, and reconciled?: Yes (Medications Reviewed) (pt reports she is not taking Ultram-only needing Tylenol ES to manage pain) Any new allergies since your discharge?: No Dietary orders reviewed?: Yes Type of Diet Ordered:: soft low fat diet Do you have support at home?: Yes People in Home: spouse Name of Support/Comfort Primary Source: Kari Welch-pt states spouse retired MD(neurologist) and taking good care of her  Medications Reviewed Today: Medications Reviewed Today     Reviewed by Charlyn Minerva, RN (Registered Nurse) on 11/10/22 at 1434  Med List Status: <None>   Medication Order Taking? Sig Documenting Provider Last Dose Status Informant  acetaminophen (TYLENOL) 500 MG tablet [id number] Yes Take 1,000 mg by mouth every 6 (six) hours as needed for mild pain. [provider] Taking Active Self  albuterol (PROAIR HFA) 108 (90 Base) MCG/ACT inhaler [id number] Yes Inhale 1-2 puffs into the lungs every 6 (six) hours as needed for wheezing or shortness of breath. Shelva Majestic, MD Taking Active Self, Pharmacy Records  ALPRAZolam Prudy Feeler) 0.5 MG  tablet [id number] Yes Take 1 tablet (0.5 mg total) by mouth every 8 (eight) hours as needed for anxiety. Do not drive for 8 hours after use. Shelva Majestic, MD Taking Active Self, Pharmacy Records  amLODipine [facility]) 5 MG tablet [id number] Yes Take 1 tablet (5 mg total) by mouth 2 (two) times daily.  Taking Active Self, Pharmacy Records  aspirin 81 MG chewable tablet [id number] Yes Chew 81 mg by mouth daily. [provider] Taking Active Self, Pharmacy Records  cilostazol (PLETAL) 100 MG tablet [id number] Yes Take 1 tablet (100 mg total) by mouth 2 (two) times daily  Taking Active Self, Pharmacy Records  cyanocobalamin 1000 MCG tablet [id number] Yes Take 1,000 mcg by mouth daily. [provider] Taking Active Self, Pharmacy Records  EPINEPHrine 0.3 mg/0.3 mL IJ SOAJ injection [id number] Yes Inject 0.3 mg into the muscle as needed for anaphylaxis. Shelva Majestic, MD Taking Active Self, Pharmacy Records  estradiol [facility] - DOSED IN MG/24 HR) 0.0375 mg/24hr patch [id number] Yes Place 1 patch (0.0375 mg total) onto the skin once a week.  Taking Active Self, Pharmacy Records  FLUoxetine (PROZAC) 40 MG capsule [id number] Yes Take 1 capsule (40 mg total) by mouth daily.  Taking Active Self, Pharmacy Records  isosorbide mononitrate (IMDUR) 60 MG 24 hr tablet [id number] Yes Take 1 tablet (60 mg total) by mouth daily.  Taking Active Self, Pharmacy Records  ondansetron (ZOFRAN-ODT) 4 MG disintegrating tablet [id number] Yes Take 1 tablet (4 mg total) by mouth every 8 (eight) hours as needed for nausea or vomiting. Sheikh, Omair Abrams, DO Taking Active   potassium chloride SA (K-DUR,KLOR-CON) 20 MEQ tablet [id number] Yes TAKE 1 TABLET DAILY.  Patient taking differently: Take 20 mEq by mouth daily.   Artist Pais,  Doe-Hyun R, DO Taking Active Self, Pharmacy Records  Rimegepant Sulfate (NURTEC) 75 MG TBDP [id number] Yes Take 75 mg by mouth daily as needed. Shelva Majestic, MD Taking Active Self, Pharmacy  Records  rosuvastatin (CRESTOR) 10 MG tablet [id number] Yes Take 1 tablet (10 mg total) by mouth once daily  Taking Active Self, Pharmacy Records  traMADol (ULTRAM) 50 MG tablet [id number] No Take 1 tablet (50 mg total) by mouth every 6 (six) hours as needed for up to 5 days for severe pain.  Patient not taking: Reported on 11/10/2022   Kari Mandes, MD Not Taking Active   triamterene-hydrochlorothiazide [facility]) 37.5-25 MG tablet [id number] Yes Take 1 tablet by mouth once daily  Taking Active Self, Pharmacy Records            Home Care and Equipment/Supplies: Were Home Health Services Ordered?: NA Any new equipment or medical supplies ordered?: NA  Functional Questionnaire: Do you need assistance with bathing/showering or dressing?: No Do you need assistance with meal preparation?: No Do you need assistance with eating?: No Do you have difficulty maintaining continence: No Do you need assistance with getting out of bed/getting out of a chair/moving?: No Do you have difficulty managing or taking your medications?: No  Follow up appointments reviewed: PCP Follow-up appointment confirmed?: No (Pt has already contacted PCP office to inquire about labs and appt-office discussing with MD and will contact pt back regarding appt) MD Provider Line Number:[phone] Given: No Specialist Hospital Follow-up appointment confirmed?: Yes Date of Specialist follow-up appointment?: 12/02/22 Follow-Up Specialty Provider:: Dr. Celesta Gentile Do you need transportation to your follow-up appointment?: No Do you understand care options if your condition(s) worsen?: Yes-patient verbalized understanding  SDOH Interventions Today    Flowsheet Row Most Recent Value  SDOH Interventions   Food Insecurity Interventions Intervention Not Indicated  Transportation Interventions Intervention Not Indicated      TOC Interventions Today    Flowsheet Row Most Recent Value  TOC Interventions   TOC  Interventions Discussed/Reviewed TOC Interventions Discussed, Post discharge activity limitations per provider, S/S of infection, Post op wound/incision care      Interventions Today    Flowsheet Row Most Recent Value  General Interventions   General Interventions Discussed/Reviewed General Interventions Discussed, Doctor Visits  Doctor Visits Discussed/Reviewed Doctor Visits Discussed, PCP, Specialist  PCP/Specialist Visits Compliance with follow-up visit  Education Interventions   Education Provided Provided Education  Provided Verbal Education On Nutrition, When to see the doctor, Medication, Other  [sx/pain mgmt]  Nutrition Interventions   Nutrition Discussed/Reviewed Nutrition Discussed, Adding fruits and vegetables, Increasing proteins, Decreasing fats, Fluid intake, Decreasing salt  Pharmacy Interventions   Pharmacy Dicussed/Reviewed Pharmacy Topics Discussed, Medications and their functions  Safety Interventions   Safety Discussed/Reviewed Safety Discussed       Alessandra Grout [facility] Care Management Care Management Community Coordinator Direct Phone: [phone] Toll Free: [phone] Fax: [phone]

## 2022-11-11 ENCOUNTER — Encounter: Payer: Self-pay | Admitting: Gastroenterology

## 2022-11-11 ENCOUNTER — Other Ambulatory Visit: Payer: Self-pay

## 2022-11-11 DIAGNOSIS — K831 Obstruction of bile duct: Secondary | ICD-10-CM

## 2022-11-11 LAB — SURGICAL PATHOLOGY

## 2022-11-11 NOTE — Transitions of Care (Post Inpatient/ED Visit) (Signed)
   11/11/2022  Name: Kari Welch MRN: [id number] DOB: 28-Oct-1945  Today's TOC FU Call Status: Today's TOC FU Call Status:: Successful TOC FU Call Completed Unsuccessful Call (1st Attempt) Date: 11/10/22 Unsuccessful Call (2nd Attempt) Date: 11/11/22 [facility] FU Call Complete Date: 11/11/22  Attempted to reach the patient regarding the most recent Inpatient/ED visit.  Follow Up Plan: No further outreach attempts will be made at this time. We have been unable to contact the patient.  Signature   Woodfin Ganja LPN [facility] Nurse Health Advisor Direct Dial [phone]

## 2022-11-18 ENCOUNTER — Other Ambulatory Visit (INDEPENDENT_AMBULATORY_CARE_PROVIDER_SITE_OTHER): Payer: Medicare Other

## 2022-11-18 DIAGNOSIS — K831 Obstruction of bile duct: Secondary | ICD-10-CM

## 2022-11-19 ENCOUNTER — Other Ambulatory Visit: Payer: Self-pay

## 2022-11-19 ENCOUNTER — Other Ambulatory Visit: Payer: Self-pay | Admitting: Family Medicine

## 2022-11-19 DIAGNOSIS — K831 Obstruction of bile duct: Secondary | ICD-10-CM

## 2022-11-19 LAB — CBC WITH DIFFERENTIAL/PLATELET
Basophils Absolute: 0.1 10*3/uL (ref 0.0–0.1)
Basophils Relative: 1.4 % (ref 0.0–3.0)
Eosinophils Absolute: 0.4 10*3/uL (ref 0.0–0.7)
Eosinophils Relative: 5.4 % — ABNORMAL HIGH (ref 0.0–5.0)
HCT: 42.5 % (ref 36.0–46.0)
Hemoglobin: 14 g/dL (ref 12.0–15.0)
Lymphocytes Relative: 28.8 % (ref 12.0–46.0)
Lymphs Abs: 1.9 10*3/uL (ref 0.7–4.0)
MCHC: 33 g/dL (ref 30.0–36.0)
MCV: 105.2 fl — ABNORMAL HIGH (ref 78.0–100.0)
Monocytes Absolute: 0.6 10*3/uL (ref 0.1–1.0)
Monocytes Relative: 9.1 % (ref 3.0–12.0)
Neutro Abs: 3.6 10*3/uL (ref 1.4–7.7)
Neutrophils Relative %: 55.3 % (ref 43.0–77.0)
Platelets: 493 10*3/uL — ABNORMAL HIGH (ref 150.0–400.0)
RBC: 4.04 Mil/uL (ref 3.87–5.11)
RDW: 13.5 % (ref 11.5–15.5)
WBC: 6.6 10*3/uL (ref 4.0–10.5)

## 2022-11-19 LAB — COMPREHENSIVE METABOLIC PANEL
ALT: 21 U/L (ref 0–35)
AST: 29 U/L (ref 0–37)
Albumin: 4 g/dL (ref 3.5–5.2)
Alkaline Phosphatase: 129 U/L — ABNORMAL HIGH (ref 39–117)
BUN: 16 mg/dL (ref 6–23)
CO2: 24 meq/L (ref 19–32)
Calcium: 9.4 mg/dL (ref 8.4–10.5)
Chloride: 97 meq/L (ref 96–112)
Creatinine, Ser: 1.28 mg/dL — ABNORMAL HIGH (ref 0.40–1.20)
GFR: 41.18 mL/min — ABNORMAL LOW (ref >60.00)
Glucose, Bld: 96 mg/dL (ref 70–99)
Potassium: 3.3 meq/L — ABNORMAL LOW (ref 3.5–5.1)
Sodium: 133 meq/L — ABNORMAL LOW (ref 135–145)
Total Bilirubin: 0.5 mg/dL (ref 0.2–1.2)
Total Protein: 7.7 g/dL (ref 6.0–8.3)

## 2022-11-19 LAB — MAGNESIUM: Magnesium: 1.2 mg/dL — ABNORMAL LOW (ref 1.5–2.5)

## 2022-11-25 DIAGNOSIS — M25512 Pain in left shoulder: Secondary | ICD-10-CM | POA: Diagnosis not present

## 2022-11-27 DIAGNOSIS — M25512 Pain in left shoulder: Secondary | ICD-10-CM | POA: Diagnosis not present

## 2022-12-02 DIAGNOSIS — M25512 Pain in left shoulder: Secondary | ICD-10-CM | POA: Diagnosis not present

## 2022-12-05 DIAGNOSIS — M25512 Pain in left shoulder: Secondary | ICD-10-CM | POA: Diagnosis not present

## 2022-12-12 DIAGNOSIS — M25512 Pain in left shoulder: Secondary | ICD-10-CM | POA: Diagnosis not present

## 2022-12-15 ENCOUNTER — Ambulatory Visit
Admission: RE | Admit: 2022-12-15 | Discharge: 2022-12-15 | Disposition: A | Discharge status: Home / Self Care | Payer: Medicare Other | Source: Ambulatory Visit | Attending: Pulmonary Disease | Admitting: Pulmonary Disease

## 2022-12-15 ENCOUNTER — Encounter (HOSPITAL_BASED_OUTPATIENT_CLINIC_OR_DEPARTMENT_OTHER): Payer: Self-pay

## 2022-12-15 ENCOUNTER — Other Ambulatory Visit (HOSPITAL_BASED_OUTPATIENT_CLINIC_OR_DEPARTMENT_OTHER): Payer: Self-pay

## 2022-12-15 DIAGNOSIS — J439 Emphysema, unspecified: Secondary | ICD-10-CM | POA: Diagnosis not present

## 2022-12-15 DIAGNOSIS — R918 Other nonspecific abnormal finding of lung field: Secondary | ICD-10-CM

## 2022-12-16 DIAGNOSIS — M25512 Pain in left shoulder: Secondary | ICD-10-CM | POA: Diagnosis not present

## 2022-12-17 ENCOUNTER — Encounter: Payer: Self-pay | Admitting: Pulmonary Disease

## 2022-12-17 ENCOUNTER — Ambulatory Visit: Payer: Medicare Other | Admitting: Pulmonary Disease

## 2022-12-17 VITALS — BP 90/60 | HR 63 | Ht 61.0 in | Wt 136.0 lb

## 2022-12-17 DIAGNOSIS — R918 Other nonspecific abnormal finding of lung field: Secondary | ICD-10-CM

## 2022-12-17 DIAGNOSIS — R053 Chronic cough: Secondary | ICD-10-CM | POA: Diagnosis not present

## 2022-12-17 NOTE — Progress Notes (Signed)
@Patient  ID: Kari Welch, female    DOB: 10/28/1945, 77 y.o.   MRN: [id number]  Chief Complaint  Patient presents with   Follow-up    Pt is here for CT results.    Referring provider: Shelva Majestic, MD  HPI:   77 y.o. woman whom we are seeing for evaluation of chronic cough.  Several notes from hospitalization 10/2022 with cholelithiasis reviewed.  Doing well.  Cough overall improved.  Using Symbicort sparingly.  Can as needed.  Cough a bit worse over the last few weeks with increase in pollens ragweed etc.  Using intranasal therapies, antihistamines etc. as needed.  Recent CT scan for 31-month follow-up of nodule.  This is stable to my and radiologist agrees.  Recommend 1 year follow-up for 18 months total.  Patient has been expressed understanding and agreement.  HPI initial visit: Patient complains of cough.  Present for months.  At least 6 months if not longer.  Typically worse in the mornings.  Hacking cough.  Sometimes brings up phlegm.  Within the also has coughing flares throughout the day.  Seems to be some variability in terms of severity and frequency day-to-day.  No position to make things better or worse.  No seasonal or environmental factors she can identify to make things better or worse.  No really alleviating or exacerbating factors.  Recently prescribed Symbicort.   She had chest imaging 05/2022, chest x-ray that on my review and interpretation reveals clear lungs with mild hyperinflation.  More recently, she had a CT chest that on my review interpretation shows fairly significant emphysematous changes with a predilection for the upper lobes, otherwise clear lungs, scattered nodules.   Questionaires / Pulmonary Flowsheets:   ACT:      No data to display          MMRC:     No data to display          Epworth:      No data to display          Tests:   FENO:  No results found for: "NITRICOXIDE"  PFT:     No data to display           WALK:      No data to display          Imaging: Personally reviewed and as per EMR CT Super D Chest Wo Contrast  Result Date: 12/17/2022 CLINICAL DATA:  Lung nodule. EXAM: CT CHEST WITHOUT CONTRAST TECHNIQUE: Multidetector CT imaging of the chest was performed using thin slice collimation for electromagnetic bronchoscopy planning purposes, without intravenous contrast. RADIATION DOSE REDUCTION: This exam was performed according to the departmental dose-optimization program which includes automated exposure control, adjustment of the mA and/or kV according to patient size and/or use of iterative reconstruction technique. COMPARISON:  CT 07/08/2022 FINDINGS: Cardiovascular: On this non IV contrast exam, the heart is nonenlarged. No pericardial effusion. Coronary artery calcifications are seen. The thoracic aorta has a normal course and caliber with some scattered vascular calcifications. Bovine type aortic arch, normal variant. Diameter of the main pulmonary artery measures 3 cm. Minimally ectatic. Unchanged from previous when adjusting for technique. Mediastinum/Nodes: On this non IV contrast exam there is no specific abnormal lymph node enlargement identified in the axillary region, hilum or mediastinum. Small thyroid gland. Slightly patulous thoracic esophagus. Lungs/Pleura: As seen previously there is some scattered dependent linear changes at the bases likely scar or atelectasis. There is also scattered air cysts in  both lungs and centrilobular emphysematous change. Bilateral apical pleural thickening. No consolidation, pneumothorax or effusion. Once again there is a bilobed nodular area in the medial right lower lobe. On series 3, image 103 the previous focus measured 6 x 4 mm, today measures 7 by 4 mm. The smaller component more caudal medial measuring 3 mm is stable as well. On coronal imaging this may be associated with the areas of bronchiectasis. Please see coronal series 5 image 80  through 86. There are some subtle reticulonodular tree-in-bud like areas as well suggested in the left lower lobe again best seen on coronal image 80 of series 5. In addition the 3 mm left upper lobe, lingular nodule is stable today on series 3, image 75. No new dominant lung nodules. Upper Abdomen: The adrenal glands are preserved in the upper abdomen. Interval cholecystectomy. There are some air-fluid levels along some nondilated loops of colon in the upper abdomen. Please correlate with any symptomatology. There is also a loop of colon extending between the liver margin in the anterior abdominal wall, a variant of distribution of bowel. This was seen previously. Musculoskeletal: Slight curvature of the spine. Slight degenerative change. IMPRESSION: Stable lung nodules identified with a dominant focus in the medial right lung base when adjusting for technique. Other foci in the lingula and left lower lobe are stable when adjusting for technique as well. Recommend continued follow up as per previous recommendation in 12-18 months. Scattered emphysematous changes identified with centrilobular changes and some thin-walled air cysts. Coronary artery calcifications. Stable appearance to the borderline enlarged pulmonary artery. Please correlate for any clinical evidence of pulmonary hypertension. Interval cholecystectomy. Air-fluid levels along nondilated loops of colon in the upper abdomen at the edge of the imaging field. Please correlate with any symptoms Aortic Atherosclerosis (ICD10-I70.0) and Emphysema (ICD10-J43.9). Electronically Signed   By: Hadlee Kays M.D.   On: 12/17/2022 11:44    Lab Results: Personally reviewed CBC    Component Value Date/Time   WBC 6.6 11/18/2022 1425   RBC 4.04 11/18/2022 1425   HGB 14.0 11/18/2022 1425   HCT 42.5 11/18/2022 1425   PLT 493.0 (H) 11/18/2022 1425   MCV 105.2 (H) 11/18/2022 1425   MCH 34.9 (H) 11/09/2022 0525   MCHC 33.0 11/18/2022 1425   RDW 13.5  11/18/2022 1425   LYMPHSABS 1.9 11/18/2022 1425   MONOABS 0.6 11/18/2022 1425   EOSABS 0.4 11/18/2022 1425   BASOSABS 0.1 11/18/2022 1425    BMET    Component Value Date/Time   NA 133 (L) 11/18/2022 1425   K 3.3 (L) 11/18/2022 1425   CL 97 11/18/2022 1425   CO2 24 11/18/2022 1425   GLUCOSE 96 11/18/2022 1425   GLUCOSE 83 01/27/2006 1158   BUN 16 11/18/2022 1425   CREATININE 1.28 (H) 11/18/2022 1425   CREATININE 1.05 (H) 03/15/2020 1155   CALCIUM 9.4 11/18/2022 1425   GFRNONAA >60 11/09/2022 0525   GFRNONAA 53 (L) 03/15/2020 1155   GFRAA 61 03/15/2020 1155    BNP No results found for: "BNP"  ProBNP    Component Value Date/Time   PROBNP 34.0 07/16/2022 1000    Specialty Problems       Pulmonary Problems   Emphysema lung (HCC)    Noted on CT April 2024       Allergies  Allergen Reactions   Methylprednisolone     With IM injection- Panic attacks, trouble with concentration, hyperactive, hyperventilation   Prednisone Swelling    short course=swelling  Immunization History  Administered Date(s) Administered   COVID-19, mRNA, vaccine(Comirnaty)12 years and older 01/15/2022   DTaP 12/25/2009   Fluad Quad(high Dose 65+) 12/21/2018, 02/14/2020, 01/08/2021, 01/15/2022   Influenza Split 01/17/2011   Influenza, High Dose Seasonal PF 02/26/2015, 04/24/2016, 01/20/2017, 03/10/2018   Influenza,inj,Quad PF,6+ Mos 01/07/2013, 01/13/2014   PFIZER Comirnaty(Gray Top)Covid-19 Tri-Sucrose Vaccine 01/15/2022   PFIZER(Purple Top)SARS-COV-2 Vaccination 05/06/2019, 05/27/2019, 12/06/2019   Pfizer Covid-19 Vaccine Bivalent Booster 48yrs & up 01/28/2021   Pneumococcal Conjugate-13 04/19/2013   Pneumococcal Polysaccharide-23 05/02/2015   Tdap 03/31/2008   Zoster Recombinant(Shingrix) 10/03/2020, 03/01/2021    Past Medical History:  Diagnosis Date   Abnormal Pap smear    h/o   Allergy    seasonal   Arthritis    Collagen vascular disease (HCC)    Elevated LFTs    Work  up negative - Dr. Juanda Chance   Hyperlipidemia    Hyperplastic colon polyp    Hypertension    Internal hemorrhoids    INTERNAL HEMORRHOIDS 01/24/2009   Qualifier: Diagnosis of  By: Nelson-Smith CMA (AAMA), Dottie     Migraine    Prothrombin gene mutation (HCC)    heterozygous   PVD (peripheral vascular disease) (HCC)    takes Pletal to open blood flow of blood to fingers, toes, and lower extremities   Raynaud's disease    Vasculitis (HCC)    Venous insufficiency    superficial left leg- seeing a vein specialist in August    Tobacco History: Social History   Tobacco Use  Smoking Status Former   Current packs/day: 0.00   Average packs/day: 0.5 packs/day for 18.0 years (9.0 ttl pk-yrs)   Types: Cigarettes   Start date: 10/11/1970   Quit date: 10/10/1988   Years since quitting: 34.2  Smokeless Tobacco Never   Counseling given: Not Answered   Continue to not smoke  Outpatient Encounter Medications as of 12/17/2022  Medication Sig   acetaminophen (TYLENOL) 500 MG tablet Take 1,000 mg by mouth every 6 (six) hours as needed for mild pain.   albuterol (PROAIR HFA) 108 (90 Base) MCG/ACT inhaler Inhale 1-2 puffs into the lungs every 6 (six) hours as needed for wheezing or shortness of breath.   ALPRAZolam (XANAX) 0.5 MG tablet Take 1 tablet (0.5 mg total) by mouth every 8 (eight) hours as needed for anxiety. Do not drive for 8 hours after use.   amLODipine (NORVASC) 5 MG tablet Take 1 tablet (5 mg total) by mouth 2 (two) times daily.   aspirin 81 MG chewable tablet Chew 81 mg by mouth daily.   cilostazol (PLETAL) 100 MG tablet Take 1 tablet (100 mg total) by mouth 2 (two) times daily   cyanocobalamin 1000 MCG tablet Take 1,000 mcg by mouth daily.   EPINEPHrine 0.3 mg/0.3 mL IJ SOAJ injection Inject 0.3 mg into the muscle as needed for anaphylaxis.   estradiol (CLIMARA - DOSED IN MG/24 HR) 0.0375 mg/24hr patch Place 1 patch (0.0375 mg total) onto the skin once a week.   FLUoxetine (PROZAC)  40 MG capsule Take 1 capsule (40 mg total) by mouth daily.   isosorbide mononitrate (IMDUR) 60 MG 24 hr tablet Take 1 tablet (60 mg total) by mouth daily.   ondansetron (ZOFRAN-ODT) 4 MG disintegrating tablet Take 1 tablet (4 mg total) by mouth every 8 (eight) hours as needed for nausea or vomiting.   potassium chloride SA (K-DUR,KLOR-CON) 20 MEQ tablet TAKE 1 TABLET DAILY. (Patient taking differently: Take 20 mEq by mouth daily.)  Rimegepant Sulfate (NURTEC) 75 MG TBDP Take 75 mg by mouth daily as needed.   rosuvastatin (CRESTOR) 10 MG tablet Take 1 tablet (10 mg total) by mouth once daily   triamterene-hydrochlorothiazide (MAXZIDE-25) 37.5-25 MG tablet Take 1 tablet by mouth once daily   No facility-administered encounter medications on file as of 12/17/2022.     Review of Systems  Review of Systems  N/a Physical Exam  BP 90/60 (BP Location: Left Arm, Cuff Size: Normal)   Pulse 63   Ht 5\' 1"  (1.549 m)   Wt 136 lb (61.7 kg)   LMP 03/31/1977   SpO2 98%   BMI 25.70 kg/m   Wt Readings from Last 5 Encounters:  12/17/22 136 lb (61.7 kg)  11/08/22 139 lb 15.9 oz (63.5 kg)  11/07/22 140 lb (63.5 kg)  07/30/22 143 lb 3.2 oz (65 kg)  07/24/22 143 lb (64.9 kg)    BMI Readings from Last 5 Encounters:  12/17/22 25.70 kg/m  11/08/22 26.45 kg/m  11/07/22 26.45 kg/m  07/30/22 27.06 kg/m  07/24/22 27.93 kg/m     Physical Exam General: Sitting in chair, no acute distress Eyes: EOMI, no icterus Neck: Supple, no JVP Pulmonary: Clear, normal work of breathing Cardiovascular: Warm, no edema Abdomen: Nondistended, bowel sounds present MSK: No synovitis, no joint effusion Neuro: Normal gait, no weakness Psych: Normal mood, full affect   Assessment & Plan:   Chronic cough: Suspect multifactorial.  Related to history of smoking, emphysema on CT versus asthma.  Improved with use of Symbicort 2 puffs twice a day.  Also concern for postnasal drip, Flonase, Astelin, antihistamine  with improvement.  Using both as needed.  Okay to use as needed unless symptoms persist at which point we will need to come up with a plan for longer-term treatment.  Pulmonary nodules: Several, solid, largest 6 mm.  62-month follow-up recommended per Fleischner criteria 11/2022 stable.  Will pursue scan in 1 year, 18 months total, per Fleischner criteria.  New order today.  Return if symptoms worsen or fail to improve.   Karren Burly, MD    This appointment required 40 minutes of patient care (this includes precharting, chart review, review of results, face-to-face care, etc.).

## 2022-12-17 NOTE — Patient Instructions (Signed)
Nice to see you again  Use the Symbicort and nasal sprays etc. as needed for congestion cough, seasonal changes  Ordered a CT scan of the chest to be done in 1 year 11/2023 to keep an eye on that nodule which is stable on your CT scan from 9/16.    Return to clinic as needed

## 2022-12-18 DIAGNOSIS — M25512 Pain in left shoulder: Secondary | ICD-10-CM | POA: Diagnosis not present

## 2022-12-19 ENCOUNTER — Other Ambulatory Visit: Payer: Self-pay | Admitting: Family Medicine

## 2022-12-19 ENCOUNTER — Other Ambulatory Visit (HOSPITAL_BASED_OUTPATIENT_CLINIC_OR_DEPARTMENT_OTHER): Payer: Self-pay

## 2022-12-19 MED ORDER — COVID-19 MRNA VAC-TRIS(PFIZER) 30 MCG/0.3ML IM SUSY
0.3000 mL | PREFILLED_SYRINGE | Freq: Once | INTRAMUSCULAR | 0 refills | Status: AC
  Filled 2022-12-19: qty 0.3, 1d supply, fill #0

## 2022-12-19 MED ORDER — INFLUENZA VAC A&B SURF ANT ADJ 0.5 ML IM SUSY
0.5000 mL | PREFILLED_SYRINGE | Freq: Once | INTRAMUSCULAR | 0 refills | Status: AC
  Filled 2022-12-19: qty 0.5, 1d supply, fill #0

## 2022-12-22 ENCOUNTER — Other Ambulatory Visit (HOSPITAL_BASED_OUTPATIENT_CLINIC_OR_DEPARTMENT_OTHER): Payer: Self-pay

## 2022-12-22 MED ORDER — NURTEC 75 MG PO TBDP
75.0000 mg | ORAL_TABLET | Freq: Every day | ORAL | 5 refills | Status: DC | PRN
  Filled 2022-12-22: qty 16, 30d supply, fill #0
  Filled 2022-12-22: qty 8, 30d supply, fill #0

## 2022-12-24 DIAGNOSIS — M25512 Pain in left shoulder: Secondary | ICD-10-CM | POA: Diagnosis not present

## 2022-12-25 ENCOUNTER — Other Ambulatory Visit: Payer: Self-pay

## 2022-12-25 ENCOUNTER — Other Ambulatory Visit (HOSPITAL_BASED_OUTPATIENT_CLINIC_OR_DEPARTMENT_OTHER): Payer: Self-pay

## 2022-12-30 DIAGNOSIS — M25512 Pain in left shoulder: Secondary | ICD-10-CM | POA: Diagnosis not present

## 2023-01-02 ENCOUNTER — Other Ambulatory Visit: Payer: Self-pay | Admitting: Family Medicine

## 2023-01-02 DIAGNOSIS — M7918 Myalgia, other site: Secondary | ICD-10-CM | POA: Diagnosis not present

## 2023-01-02 DIAGNOSIS — M79672 Pain in left foot: Secondary | ICD-10-CM | POA: Diagnosis not present

## 2023-01-02 DIAGNOSIS — Z1212 Encounter for screening for malignant neoplasm of rectum: Secondary | ICD-10-CM

## 2023-01-02 DIAGNOSIS — M25512 Pain in left shoulder: Secondary | ICD-10-CM | POA: Diagnosis not present

## 2023-01-02 DIAGNOSIS — Z1211 Encounter for screening for malignant neoplasm of colon: Secondary | ICD-10-CM

## 2023-01-07 ENCOUNTER — Encounter: Payer: Self-pay | Admitting: Family Medicine

## 2023-01-12 ENCOUNTER — Other Ambulatory Visit (HOSPITAL_BASED_OUTPATIENT_CLINIC_OR_DEPARTMENT_OTHER): Payer: Self-pay

## 2023-01-28 DIAGNOSIS — M67472 Ganglion, left ankle and foot: Secondary | ICD-10-CM | POA: Diagnosis not present

## 2023-01-28 DIAGNOSIS — M75112 Incomplete rotator cuff tear or rupture of left shoulder, not specified as traumatic: Secondary | ICD-10-CM | POA: Diagnosis not present

## 2023-01-29 ENCOUNTER — Other Ambulatory Visit (HOSPITAL_BASED_OUTPATIENT_CLINIC_OR_DEPARTMENT_OTHER): Payer: Self-pay

## 2023-01-29 ENCOUNTER — Other Ambulatory Visit: Payer: Self-pay | Admitting: Family Medicine

## 2023-01-29 DIAGNOSIS — M25512 Pain in left shoulder: Secondary | ICD-10-CM | POA: Diagnosis not present

## 2023-01-29 MED ORDER — BUDESONIDE-FORMOTEROL FUMARATE 160-4.5 MCG/ACT IN AERO
2.0000 | INHALATION_SPRAY | Freq: Two times a day (BID) | RESPIRATORY_TRACT | 3 refills | Status: DC
  Filled 2023-01-29: qty 10.2, 30d supply, fill #0

## 2023-01-30 ENCOUNTER — Other Ambulatory Visit (HOSPITAL_BASED_OUTPATIENT_CLINIC_OR_DEPARTMENT_OTHER): Payer: Self-pay

## 2023-02-05 ENCOUNTER — Other Ambulatory Visit (HOSPITAL_BASED_OUTPATIENT_CLINIC_OR_DEPARTMENT_OTHER): Payer: Self-pay

## 2023-02-05 DIAGNOSIS — M25512 Pain in left shoulder: Secondary | ICD-10-CM | POA: Diagnosis not present

## 2023-02-16 ENCOUNTER — Telehealth: Payer: Self-pay | Admitting: Physician Assistant

## 2023-02-16 NOTE — Telephone Encounter (Signed)
PT's husband is calling to find out if Dr. Meridee Score will be performing the colonoscopy for PT. He was told in the hospital that he would be willing to do the procedure. Please advise.

## 2023-02-16 NOTE — Telephone Encounter (Signed)
Screening colonoscopy can be scheduled in the [facility]. Thanks. GM

## 2023-02-18 ENCOUNTER — Encounter: Payer: Self-pay | Admitting: Gastroenterology

## 2023-02-18 ENCOUNTER — Other Ambulatory Visit (HOSPITAL_BASED_OUTPATIENT_CLINIC_OR_DEPARTMENT_OTHER): Payer: Self-pay

## 2023-02-18 NOTE — Telephone Encounter (Signed)
Left VM to schedule procedure

## 2023-02-19 DIAGNOSIS — M25512 Pain in left shoulder: Secondary | ICD-10-CM | POA: Diagnosis not present

## 2023-02-25 DIAGNOSIS — K080 Exfoliation of teeth due to systemic causes: Secondary | ICD-10-CM | POA: Diagnosis not present

## 2023-03-09 ENCOUNTER — Telehealth: Payer: Self-pay

## 2023-03-09 ENCOUNTER — Other Ambulatory Visit (HOSPITAL_COMMUNITY): Payer: Self-pay

## 2023-03-09 NOTE — Telephone Encounter (Signed)
Pharmacy Patient Advocate Encounter   Received notification from CoverMyMeds that prior authorization for Nurtec 75MG  dispersible tablets is required/requested.   Insurance verification completed.   The patient is insured through  [employer]  .   Per test claim: PA required; PA submitted to above mentioned insurance via CoverMyMeds Key/confirmation #/EOC [id number] Status is pending

## 2023-03-10 NOTE — Telephone Encounter (Signed)
Pharmacy Patient Advocate Encounter  Received notification from  [facility]  that Prior Authorization for Nurtec 75MG  dispersible tablets has been APPROVED from 03/09/2023 to 03/08/2024   PA #/Case ID/Reference #: [id number]

## 2023-03-11 DIAGNOSIS — M67472 Ganglion, left ankle and foot: Secondary | ICD-10-CM | POA: Diagnosis not present

## 2023-03-11 DIAGNOSIS — M25512 Pain in left shoulder: Secondary | ICD-10-CM | POA: Diagnosis not present

## 2023-03-17 ENCOUNTER — Other Ambulatory Visit (HOSPITAL_BASED_OUTPATIENT_CLINIC_OR_DEPARTMENT_OTHER): Payer: Self-pay

## 2023-03-18 ENCOUNTER — Other Ambulatory Visit: Payer: Self-pay

## 2023-03-18 ENCOUNTER — Other Ambulatory Visit (HOSPITAL_BASED_OUTPATIENT_CLINIC_OR_DEPARTMENT_OTHER): Payer: Self-pay

## 2023-03-19 ENCOUNTER — Other Ambulatory Visit (HOSPITAL_BASED_OUTPATIENT_CLINIC_OR_DEPARTMENT_OTHER): Payer: Self-pay

## 2023-03-22 ENCOUNTER — Other Ambulatory Visit (HOSPITAL_BASED_OUTPATIENT_CLINIC_OR_DEPARTMENT_OTHER): Payer: Self-pay

## 2023-03-22 MED ORDER — POTASSIUM CHLORIDE CRYS ER 20 MEQ PO TBCR: 20.0000 meq | EXTENDED_RELEASE_TABLET | Freq: Every day | ORAL | 1 refills | Status: DC

## 2023-03-23 ENCOUNTER — Other Ambulatory Visit (HOSPITAL_BASED_OUTPATIENT_CLINIC_OR_DEPARTMENT_OTHER): Payer: Self-pay

## 2023-03-23 ENCOUNTER — Encounter: Payer: Medicare Other | Admitting: Family Medicine

## 2023-03-27 ENCOUNTER — Other Ambulatory Visit (HOSPITAL_BASED_OUTPATIENT_CLINIC_OR_DEPARTMENT_OTHER): Payer: Self-pay

## 2023-03-31 ENCOUNTER — Ambulatory Visit (AMBULATORY_SURGERY_CENTER): Payer: Medicare Other

## 2023-03-31 VITALS — Ht 61.0 in | Wt 137.0 lb

## 2023-03-31 DIAGNOSIS — Z1211 Encounter for screening for malignant neoplasm of colon: Secondary | ICD-10-CM

## 2023-03-31 NOTE — Progress Notes (Signed)
 No egg or soy allergy known to patient  No issues known to pt with past sedation with any surgeries or procedures Patient denies ever being told they had issues or difficulty with intubation  No FH of Malignant Hyperthermia Pt is not on diet pills Pt is not on  home 02  Pt is not on blood thinners  Constipation: occasional  No A fib or A flutter Have any cardiac testing pending-- no  LOA: independent  Prep: spilt dose miralax   Patient's chart reviewed by Norleen Schillings CNRA prior to previsit and patient appropriate for the [facility].  Previsit completed and red dot placed by patient's name on their procedure day (on provider's schedule).     PV competed with patient. Prep instructions sent via mychart and home address.

## 2023-04-10 DIAGNOSIS — H90A22 Sensorineural hearing loss, unilateral, left ear, with restricted hearing on the contralateral side: Secondary | ICD-10-CM | POA: Diagnosis not present

## 2023-04-10 DIAGNOSIS — H903 Sensorineural hearing loss, bilateral: Secondary | ICD-10-CM | POA: Diagnosis not present

## 2023-04-17 ENCOUNTER — Encounter: Payer: Self-pay | Admitting: Gastroenterology

## 2023-04-21 ENCOUNTER — Encounter: Payer: Medicare Other | Admitting: Gastroenterology

## 2023-05-12 ENCOUNTER — Ambulatory Visit (INDEPENDENT_AMBULATORY_CARE_PROVIDER_SITE_OTHER): Payer: Medicare Other | Admitting: Family Medicine

## 2023-05-12 ENCOUNTER — Encounter: Payer: Self-pay | Admitting: Family Medicine

## 2023-05-12 ENCOUNTER — Other Ambulatory Visit (HOSPITAL_BASED_OUTPATIENT_CLINIC_OR_DEPARTMENT_OTHER): Payer: Self-pay

## 2023-05-12 VITALS — BP 110/62 | HR 63 | Temp 97.3°F | Ht 61.0 in | Wt 139.0 lb

## 2023-05-12 DIAGNOSIS — J439 Emphysema, unspecified: Secondary | ICD-10-CM

## 2023-05-12 DIAGNOSIS — Z0000 Encounter for general adult medical examination without abnormal findings: Secondary | ICD-10-CM

## 2023-05-12 DIAGNOSIS — Z1283 Encounter for screening for malignant neoplasm of skin: Secondary | ICD-10-CM

## 2023-05-12 DIAGNOSIS — E785 Hyperlipidemia, unspecified: Secondary | ICD-10-CM | POA: Diagnosis not present

## 2023-05-12 DIAGNOSIS — Z91018 Allergy to other foods: Secondary | ICD-10-CM

## 2023-05-12 DIAGNOSIS — N183 Chronic kidney disease, stage 3 unspecified: Secondary | ICD-10-CM

## 2023-05-12 DIAGNOSIS — Z79899 Other long term (current) drug therapy: Secondary | ICD-10-CM

## 2023-05-12 DIAGNOSIS — I10 Essential (primary) hypertension: Secondary | ICD-10-CM | POA: Diagnosis not present

## 2023-05-12 DIAGNOSIS — D6859 Other primary thrombophilia: Secondary | ICD-10-CM

## 2023-05-12 LAB — TSH: TSH: 6.06 u[IU]/mL — ABNORMAL HIGH (ref 0.35–5.50)

## 2023-05-12 LAB — COMPREHENSIVE METABOLIC PANEL
ALT: 13 U/L (ref 0–35)
AST: 19 U/L (ref 0–37)
Albumin: 4.1 g/dL (ref 3.5–5.2)
Alkaline Phosphatase: 47 U/L (ref 39–117)
BUN: 18 mg/dL (ref 6–23)
CO2: 27 meq/L (ref 19–32)
Calcium: 9.3 mg/dL (ref 8.4–10.5)
Chloride: 101 meq/L (ref 96–112)
Creatinine, Ser: 1 mg/dL (ref 0.40–1.20)
GFR: 55.19 mL/min — ABNORMAL LOW (ref >60.00)
Glucose, Bld: 98 mg/dL (ref 70–99)
Potassium: 2.9 meq/L — ABNORMAL LOW (ref 3.5–5.1)
Sodium: 138 meq/L (ref 135–145)
Total Bilirubin: 0.5 mg/dL (ref 0.2–1.2)
Total Protein: 7.6 g/dL (ref 6.0–8.3)

## 2023-05-12 LAB — URINALYSIS, ROUTINE W REFLEX MICROSCOPIC
Bilirubin Urine: NEGATIVE
Hgb urine dipstick: NEGATIVE
Ketones, ur: NEGATIVE
Leukocytes,Ua: NEGATIVE
Nitrite: NEGATIVE
RBC / HPF: NONE SEEN (ref 0–?)
Specific Gravity, Urine: 1.02 (ref 1.000–1.030)
Total Protein, Urine: NEGATIVE
Urine Glucose: NEGATIVE
Urobilinogen, UA: 0.2 (ref 0.0–1.0)
pH: 6 (ref 5.0–8.0)

## 2023-05-12 LAB — CBC WITH DIFFERENTIAL/PLATELET
Basophils Absolute: 0.1 10*3/uL (ref 0.0–0.1)
Basophils Relative: 1.4 % (ref 0.0–3.0)
Eosinophils Absolute: 0.1 10*3/uL (ref 0.0–0.7)
Eosinophils Relative: 2.2 % (ref 0.0–5.0)
HCT: 44.4 % (ref 36.0–46.0)
Hemoglobin: 14.8 g/dL (ref 12.0–15.0)
Lymphocytes Relative: 22.6 % (ref 12.0–46.0)
Lymphs Abs: 1.5 10*3/uL (ref 0.7–4.0)
MCHC: 33.3 g/dL (ref 30.0–36.0)
MCV: 105.6 fL — ABNORMAL HIGH (ref 78.0–100.0)
Monocytes Absolute: 0.6 10*3/uL (ref 0.1–1.0)
Monocytes Relative: 8.9 % (ref 3.0–12.0)
Neutro Abs: 4.2 10*3/uL (ref 1.4–7.7)
Neutrophils Relative %: 64.9 % (ref 43.0–77.0)
Platelets: 379 10*3/uL (ref 150.0–400.0)
RBC: 4.2 Mil/uL (ref 3.87–5.11)
RDW: 13.6 % (ref 11.5–15.5)
WBC: 6.5 10*3/uL (ref 4.0–10.5)

## 2023-05-12 LAB — LIPID PANEL
Cholesterol: 184 mg/dL (ref 0–200)
HDL: 97.9 mg/dL (ref >39.00)
LDL Cholesterol: 69 mg/dL (ref 0–99)
NonHDL: 86.42
Total CHOL/HDL Ratio: 2
Triglycerides: 87 mg/dL (ref 0.0–149.0)
VLDL: 17.4 mg/dL (ref 0.0–40.0)

## 2023-05-12 MED ORDER — HYDROCODONE-ACETAMINOPHEN 10-325 MG PO TABS
1.0000 | ORAL_TABLET | ORAL | 0 refills | Status: AC | PRN
  Filled 2023-05-12: qty 30, 5d supply, fill #0

## 2023-05-12 MED ORDER — NURTEC 75 MG PO TBDP
75.0000 mg | ORAL_TABLET | Freq: Every day | ORAL | 5 refills | Status: DC | PRN
  Filled 2023-05-12: qty 16, 30d supply, fill #0
  Filled 2023-09-16: qty 16, 30d supply, fill #1
  Filled 2023-10-16: qty 16, 30d supply, fill #2
  Filled 2023-11-16: qty 16, 30d supply, fill #3
  Filled 2023-12-16: qty 16, 30d supply, fill #4
  Filled 2024-01-08: qty 16, 30d supply, fill #5

## 2023-05-12 MED ORDER — ALPRAZOLAM 0.5 MG PO TABS
0.5000 mg | ORAL_TABLET | Freq: Three times a day (TID) | ORAL | 0 refills | Status: AC | PRN
  Filled 2023-05-12: qty 30, 10d supply, fill #0

## 2023-05-12 NOTE — Addendum Note (Signed)
Addended by: Filomena Jungling on: 05/12/2023 12:20 PM   Modules accepted: Orders

## 2023-05-12 NOTE — Progress Notes (Signed)
Phone [phone]   Subjective:  Patient presents today for their annual physical. Chief complaint-noted.   See problem oriented charting- ROS- full  review of systems was completed and negative except for: migraines at times, back pain at times  The following were reviewed and entered/updated in epic: Past Medical History:  Diagnosis Date   Abnormal Pap smear    h/o   Allergy    seasonal   Arthritis    Collagen vascular disease (HCC)    Elevated LFTs    Work up negative - Dr. Juanda Chance   Hyperlipidemia    Hyperplastic colon polyp    Hypertension    Internal hemorrhoids    INTERNAL HEMORRHOIDS 01/24/2009   Qualifier: Diagnosis of  By: Candice Camp CMA (AAMA), Dottie     Migraine    Prothrombin gene mutation (HCC)    heterozygous   PVD (peripheral vascular disease) (HCC)    takes Pletal to open blood flow of blood to fingers, toes, and lower extremities   Raynaud's disease    Vasculitis (HCC)    Venous insufficiency    superficial left leg- seeing a vein specialist in August   Patient Active Problem List   Diagnosis Date Noted   Emphysema lung (HCC) 07/08/2022    Priority: High   Allergy to alpha-gal 05/21/2021    Priority: High   Urticaria 03/15/2020    Priority: High   Chronic back pain 11/29/2013    Priority: High   Hypercoagulable state (HCC) 05/26/2012    Priority: High   Paroxysmal digital cyanosis 05/26/2012    Priority: High   Vasculopathy 11/29/2007    Priority: High   Osteopenia of left femoral neck 06/20/2021    Priority: Medium    Elevated TSH 03/10/2018    Priority: Medium    CKD (chronic kidney disease), stage III (HCC) 03/04/2017    Priority: Medium    Transaminitis 05/02/2015    Priority: Medium    Adjustment disorder with anxiety 11/29/2013    Priority: Medium    Intermittent claudication (HCC) 05/26/2012    Priority: Medium    Hyperlipidemia 02/04/2010    Priority: Medium    Essential hypertension 11/19/2006    Priority: Medium     Cerumen impaction 03/04/2017    Priority: Low   Varicose veins of right lower extremity with pain     Priority: Low   Macrocytosis without anemia 11/29/2013    Priority: Low   Obstructive hyperbilirubinemia 11/07/2022   Choledocholithiasis 11/07/2022   Abnormal LFTs 11/07/2022   Abdominal pain, chronic, right upper quadrant 11/07/2022   Intractable nausea and vomiting 11/07/2022   Migraine    Arthritis 11/15/2015   Prothrombin gene mutation (HCC) 05/26/2012   Past Surgical History:  Procedure Laterality Date   ABDOMINAL HYSTERECTOMY  1979   TAH/LSO   ABLATION SAPHENOUS VEIN W/ RFA     BIOPSY  11/08/2022   Procedure: BIOPSY;  Surgeon: Lemar Lofty., MD;  Location: [facility];  Service: Gastroenterology;;   BREAST BIOPSY Left 10/27/2022   MM LT BREAST BX W LOC DEV 1ST LESION IMAGE BX SPEC STEREO GUIDE 10/27/2022 [facility]   BUNIONECTOMY WITH HAMMERTOE RECONSTRUCTION Left 10/13/2013   Procedure: LEFT FIRST METATARSAL SCARF OSTEOTOMY;  LEFT MODIFIED MCBRIDE BUNIONECTOMY AND SECOND HAMMERTOE CORRECTION;  Surgeon: Toni Arthurs, MD;  Location: [facility];  Service: Orthopedics;  Laterality: Left;   CATARACT EXTRACTION Bilateral 09/2014   Dr. Vonna Kotyk   CERVIX LESION DESTRUCTION  1970   CHOLECYSTECTOMY N/A 11/09/2022  Procedure: LAPAROSCOPIC CHOLECYSTECTOMY;  Surgeon: Fritzi Mandes, MD;  Location: [facility];  Service: General;  Laterality: N/A;   COLONOSCOPY     COLONOSCOPY     DILATION AND CURETTAGE OF UTERUS     ERCP N/A 11/08/2022   Procedure: ENDOSCOPIC RETROGRADE CHOLANGIOPANCREATOGRAPHY (ERCP);  Surgeon: Lemar Lofty., MD;  Location: [facility];  Service: Gastroenterology;  Laterality: N/A;   ESOPHAGOGASTRODUODENOSCOPY N/A 11/08/2022   Procedure: ESOPHAGOGASTRODUODENOSCOPY (EGD);  Surgeon: Lemar Lofty., MD;  Location: [facility];  Service: Gastroenterology;  Laterality: N/A;   LASER ABLATION Bilateral    leg-left 8/14, right 12/14    MASS EXCISION Left    hand   osteopenia of hip  2017   T score -1.1   PELVIC LAPAROSCOPY     d/t infertility   REMOVAL OF STONES  11/08/2022   Procedure: REMOVAL OF STONES;  Surgeon: Meridee Score Netty Starring., MD;  Location: [facility];  Service: Gastroenterology;;   right bartholin's gland cyst excision Right    1990s   SPHINCTEROTOMY  11/08/2022   Procedure: SPHINCTEROTOMY;  Surgeon: Mansouraty, Netty Starring., MD;  Location: [facility];  Service: Gastroenterology;;   sympthectomy Left 2008   hand ([facility])    Family History  Problem Relation Age of Onset   Stroke Mother    Colon cancer Mother 48       dec with colon ca   Heart attack Mother    Migraines Mother    Stroke Father    Heart attack Father    Gallbladder disease Father 7       septic gallbladder   Rheum arthritis Brother        dec age 36 ?Rheumatoid arthritis   Diabetes Maternal Grandmother    Esophageal cancer Neg Hx    Stomach cancer Neg Hx    Rectal cancer Neg Hx     Medications- reviewed and updated Current Outpatient Medications  Medication Sig Dispense Refill   acetaminophen (TYLENOL) 500 MG tablet Take 1,000 mg by mouth every 6 (six) hours as needed for mild pain.     albuterol (PROAIR HFA) 108 (90 Base) MCG/ACT inhaler Inhale 1-2 puffs into the lungs every 6 (six) hours as needed for wheezing or shortness of breath. 18 g 0   ALPRAZolam (XANAX) 0.5 MG tablet Take 1 tablet (0.5 mg total) by mouth every 8 (eight) hours as needed for anxiety. Do not drive for 8 hours after use. 30 tablet 0   amLODipine (NORVASC) 5 MG tablet Take 1 tablet (5 mg total) by mouth 2 (two) times daily. 180 tablet 3   aspirin 81 MG chewable tablet Chew 81 mg by mouth daily.     budesonide-formoterol (SYMBICORT) 160-4.5 MCG/ACT inhaler Inhale 2 puffs into the lungs 2 (two) times daily. 10.2 g 3   cilostazol (PLETAL) 100 MG tablet Take 1 tablet (100 mg total) by mouth 2 (two) times daily 180 tablet 3   cyanocobalamin 1000 MCG  tablet Take 1,000 mcg by mouth daily.     EPINEPHrine 0.3 mg/0.3 mL IJ SOAJ injection Inject 0.3 mg into the muscle as needed for anaphylaxis. 2 each 0   estradiol (CLIMARA - DOSED IN MG/24 HR) 0.0375 mg/24hr patch Place 1 patch onto the skin once a week 12 patch 3   FLUoxetine (PROZAC) 40 MG capsule Take 1 capsule (40 mg total) by mouth daily. 90 capsule 3   isosorbide mononitrate (IMDUR) 60 MG 24 hr tablet Take 1 tablet (60 mg total) by mouth daily. 90 tablet  3   ondansetron (ZOFRAN-ODT) 4 MG disintegrating tablet Take 1 tablet (4 mg total) by mouth every 8 (eight) hours as needed for nausea or vomiting. 20 tablet 0   potassium chloride SA (KLOR-CON M) 20 MEQ tablet Take 1 tablet (20 mEq total) by mouth daily. 90 tablet 1   Rimegepant Sulfate (NURTEC) 75 MG TBDP Take 75 mg by mouth daily as needed. 16 tablet 5   rosuvastatin (CRESTOR) 10 MG tablet Take 1 tablet (10 mg total) by mouth once daily 90 tablet 3   triamterene-hydrochlorothiazide (MAXZIDE-25) 37.5-25 MG tablet Take 1 tablet by mouth once daily 90 tablet 3   No current facility-administered medications for this visit.    Allergies-reviewed and updated Allergies  Allergen Reactions   Methylprednisolone     With IM injection- Panic attacks, trouble with concentration, hyperactive, hyperventilation   Prednisone Swelling    short course=swelling    Social History   Social History Narrative   Married 28 years   3 adopted children   Objective  Objective:  BP 110/62   Temp (!) 97.3 F (36.3 C)   Ht 5\' 1"  (1.549 m)   Wt 139 lb (63 kg)   LMP 03/31/1977   BMI 26.26 kg/m  Gen: NAD, resting comfortably HEENT: Mucous membranes are moist. Oropharynx normal Neck: no thyromegaly Breasts: normal appearance, no masses or tenderness scar from biopsy noted CV: RRR no murmurs rubs or gallops Lungs: CTAB no crackles, wheeze, rhonchi Abdomen: soft/nontender/nondistended/normal bowel sounds. No rebound or guarding.  Ext: no  edema Skin: warm, dry Neuro: grossly normal, moves all extremities, PERRLA   Assessment and Plan   78 y.o. female presenting for annual physical.  Health Maintenance counseling: 1. Anticipatory guidance: Patient counseled regarding regular dental exams -q3 months, eye exams - every 2 years,  avoiding smoking and second hand smoke , limiting alcohol to 1 beverage per day- maybe 2 glasses of wine a week, no illicit drugs .   2. Risk factor reduction:  Advised patient of need for regular exercise and diet rich and fruits and vegetables to reduce risk of heart attack and stroke.  Exercise-Free membership in gym but has not wanted to go-also has treadmill at home. Did start walking new puppy more regularly up to 20 minutes twice a day Diet/weight management-weight down 3 pounds from last physical-encouraged gradual weight loss- has worked to improve diet. Mediterranean diet but may add more protein sources- chicken/fish Wt Readings from Last 3 Encounters:  05/12/23 139 lb (63 kg)  03/31/23 137 lb (62.1 kg)  12/17/22 136 lb (61.7 kg)  3. Immunizations/screenings/ancillary studies-Tdap recommended at pharmacy once again  Immunization History  Administered Date(s) Administered   DTaP 12/25/2009   Fluad Quad(high Dose 65+) 12/21/2018, 02/14/2020, 01/08/2021, 01/15/2022   Fluad Trivalent(High Dose 65+) 12/19/2022   Influenza Split 01/17/2011   Influenza, High Dose Seasonal PF 02/26/2015, 04/24/2016, 01/20/2017, 03/10/2018   Influenza,inj,Quad PF,6+ Mos 01/07/2013, 01/13/2014   PFIZER Comirnaty(Gray Top)Covid-19 Tri-Sucrose Vaccine 01/15/2022   PFIZER(Purple Top)SARS-COV-2 Vaccination 05/06/2019, 05/27/2019, 12/06/2019   Pfizer Covid-19 Vaccine Bivalent Booster 16yrs & up 01/28/2021   Pfizer(Comirnaty)Fall Seasonal Vaccine 12 years and older 01/15/2022, 12/19/2022   Pneumococcal Conjugate-13 04/19/2013   Pneumococcal Polysaccharide-23 05/02/2015   Tdap 03/31/2008   Zoster Recombinant(Shingrix)  10/03/2020, 03/01/2021    4. Cervical cancer screening- history of hysterectomy and never had abnormal Pap  5. Breast cancer screening-   ended up with biopsy 10/27/2022 that was thankfully benign and back to annual checks in summer  6. Colon cancer screening - 11/03/2012 and have been scheduled for colonoscopy- this got moved out of the march but still scheduled  7. Skin cancer screening-refer to dermatology- unfortunately despite seeing [facility] in past was denied repeat visit. Denies worrisome, changing, or new skin lesions.  8. Birth control/STD check-hysterectomy and only active with husband  9. Osteoporosis screening at 65-low bone density in 2023-repeat in 3 years. Encouraged vitamin D (encouraged to start) and weight bearing exercise and calcium through diet  10. Smoking associated screening -former smoker-under a pack a day for 45 years but quit over 31 years ago-will check urinalysis   Status of chronic or acute concerns   #Vasculopathy #Hypercoagulable state [facility]) #Claudication- [facility] prior- now Dr. Clair Gulling  S: doing well overall  -On pletal/cilostazol 100mg  twice a day to help with claudication. -Known hypercoagulable state as she is heterozygous for prothrombin mutation but no history of arterial or venous thrombosis.  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   -She is on vasodilators amlodipine  5 mg BID and Imdur 60mg  to try to help  -trying to avoid nsaids   -Does take aspirin 81 mg A/P: all 3 issues stable- continue current medications   #Migraines- due to vascular concerns avoids nsaids and triptans. Trial nurtec 02/2020 to see if helpful - has worked well #Chronic bilateral low back pain with right-sided sciatica- on and off issues S: -Patient uses very sparing hydrocodone for migraines her flareups of back pain. -Typically #30 hydrocodone last at least 4 months- spacing out more with Nurtec  use A/P: migraines overall stable- continue to monitor  - update UDS today - PDMP reviewed - low risk trend -also on on alprazolam but knows to space by at least 8 hours from hydrocodone controlled substance contract on file from 2018   #hyperlipidemia/elevated LFTs in the past # Aortic atherosclerosis and coronary artery calcifications S: Medication:Rosuvastatin 10 mg daily- takes extra half every other day.  LDL goal at least under 100 but likely would prefer under 70 with microvascular disease  Transaminitis-thought potentially related to statin use in the past.  Normal 2020 Lab Results  Component Value Date   CHOL 181 03/20/2022   HDL 81.60 03/20/2022   LDLCALC 82 03/20/2022   LDLDIRECT 58.0 09/18/2020   TRIG 85.0 03/20/2022   CHOLHDL 2 03/20/2022   A/P: hopefully with adjustment on extra half dose every other day LDL back under 70- update levels today Aortic atherosclerosis (presumed stable)- LDL goal ideally <70 - continue current medications for now- update levels    #hypertension S: medication: Amlodipine 5 mg twice daily, Imdur 60 mg daily, triamterene hydrochlorothiazide 37.5-25 mg.  Also takes potassium BP Readings from Last 3 Encounters:  05/12/23 110/62  12/17/22 90/60  11/09/22 (!) 106/59  A/P: stable- continue current medicines    #Chronic kidney disease stage III S: GFR is typically in the 50s range-has dipped into the 40s with poor hydration in the past -Patient knows to avoid NSAIDs  A/P: GFR around 40's last check- update today    # Adjustment disorder with anxiety S: Medication: Prozac 40 mg.  Sparing alprazolam a few times a month for anxiety element A/P: overall controlled - alprazolam 1/2 tablet less than once a week   #Elevated TSH-slightly elevated TSH in the past.  Trend at least every 6 to 12 months Lab Results  Component Value Date   TSH 6.44 (H) 07/16/2022   #Reactive airways-albuterol as needed  -  Issues when gets exposed to dust/pollen at  times -morning cough- does not like nasal spray -Emphysema noted on imaging  -Symbicort helpful per Dr. Judeth Horn- hasn't needed lately but allergy season coming  #Macrocytosis-patient reports many years of issues.  B12 and folate have been normal in the past but high methylmalonic acid so she is taking B12-chronic issue   # Alpha gal- hives starting 2021 after meat intake potentially. testing alpha gal. seeing allergist. Epi pen as had tongue swelling as well and gave dose for prednisone -alpha gal positive - check today for levesl  # Hormone replacement-on estradiol through cardiology- they are ok with this   # Hearing loss-has seen Dr. Jenne Pane January 2025 and hearing aid recommended on the left- recheck in 6 months to reevalute   # Nodules-followed by pulmonology-plan for 1 year repeat from September 2024   Recommended follow up: Return for next already scheduled visit or sooner if needed. Future Appointments  Date Time Provider Department Center  06/10/2023  3:00 PM Mansouraty, Netty Starring., MD [facility]  05/12/2024  9:20 AM Durene Cal Aldine Contes, MD [facility]   Lab/Order associations: fasting   ICD-10-CM   1. Preventative health care  Z00.00     2. Essential hypertension  I10     3. Hyperlipidemia, unspecified hyperlipidemia type  E78.5     4. Stage 3 chronic kidney disease, unspecified whether stage 3a or 3b CKD (HCC)  N18.30     5. Pulmonary emphysema, unspecified emphysema type (HCC)  J43.9     6. Hypercoagulable state (HCC)  D68.59     7. Screening exam for skin cancer  Z12.83 Ambulatory referral to Dermatology    8. Allergy to alpha-gal  Z91.018     9. High risk medication use  Z79.899       No orders of the defined types were placed in this encounter.   Return precautions advised.  Tana Conch, MD

## 2023-05-12 NOTE — Patient Instructions (Addendum)
Tetanus, Diphtheria, and Pertussis (Tdap) at pharmacy recommended  Consider vitamin D 1000 units  Please stop by lab before you go If you have mychart- we will send your results within 3 business days of [place] receiving them.  If you do not have mychart- we will call you about results within 5 business days of [place] receiving them.  *please also note that you will see labs on mychart as soon as they post. I will later go in and write notes on them- will say "notes from Dr. Durene Cal"   Recommended follow up: Return for next already scheduled visit or sooner if needed.

## 2023-05-13 ENCOUNTER — Other Ambulatory Visit (HOSPITAL_BASED_OUTPATIENT_CLINIC_OR_DEPARTMENT_OTHER): Payer: Self-pay

## 2023-05-13 NOTE — Telephone Encounter (Signed)
Left pt message to either call the office back or respond to our message through [facility]

## 2023-05-14 ENCOUNTER — Other Ambulatory Visit: Payer: Self-pay

## 2023-05-14 DIAGNOSIS — E875 Hyperkalemia: Secondary | ICD-10-CM

## 2023-05-16 LAB — DRUG MONITORING, PANEL 8 WITH CONFIRMATION, URINE

## 2023-05-16 LAB — ALPHA-GAL PANEL
Allergen, Mutton, f88: <0.1 kU/L
Allergen, Pork, f26: 0.11 kU/L — ABNORMAL HIGH
Beef: 0.29 kU/L — ABNORMAL HIGH
Class: 0
GALACTOSE-ALPHA-1,3-GALACTOSE IGE*: 0.98 kU/L — ABNORMAL HIGH (ref <0.10)

## 2023-05-16 LAB — INTERPRETATION:

## 2023-05-16 LAB — DM TEMPLATE

## 2023-05-18 ENCOUNTER — Encounter: Payer: Self-pay | Admitting: Family Medicine

## 2023-05-29 NOTE — Telephone Encounter (Signed)
 Seen by pt on 05/13/23

## 2023-06-03 DIAGNOSIS — K080 Exfoliation of teeth due to systemic causes: Secondary | ICD-10-CM | POA: Diagnosis not present

## 2023-06-10 ENCOUNTER — Ambulatory Visit: Payer: Medicare Other | Admitting: Gastroenterology

## 2023-06-10 ENCOUNTER — Encounter: Payer: Self-pay | Admitting: Gastroenterology

## 2023-06-10 ENCOUNTER — Telehealth: Payer: Self-pay

## 2023-06-10 VITALS — BP 118/70 | HR 63 | Temp 97.3°F | Resp 14 | Ht 61.0 in | Wt 137.0 lb

## 2023-06-10 DIAGNOSIS — Z1211 Encounter for screening for malignant neoplasm of colon: Secondary | ICD-10-CM

## 2023-06-10 DIAGNOSIS — K562 Volvulus: Secondary | ICD-10-CM | POA: Diagnosis not present

## 2023-06-10 DIAGNOSIS — K641 Second degree hemorrhoids: Secondary | ICD-10-CM | POA: Diagnosis not present

## 2023-06-10 DIAGNOSIS — D123 Benign neoplasm of transverse colon: Secondary | ICD-10-CM | POA: Diagnosis not present

## 2023-06-10 MED ORDER — SODIUM CHLORIDE 0.9 % IV SOLN: 500.0000 mL | Freq: Once | INTRAVENOUS | Status: DC

## 2023-06-10 NOTE — Patient Instructions (Signed)
-   High fiber diet. - Use FiberCon 1-2 tablets PO daily. - Continue present medications. - Await pathology results   YOU HAD AN ENDOSCOPIC PROCEDURE TODAY AT THE [facility]:   Refer to the procedure report that was given to you for any specific questions about what was found during the examination.  If the procedure report does not answer your questions, please call your gastroenterologist to clarify.  If you requested that your care partner not be given the details of your procedure findings, then the procedure report has been included in a sealed envelope for you to review at your convenience later.  YOU SHOULD EXPECT: Some feelings of bloating in the abdomen. Passage of more gas than usual.  Walking can help get rid of the air that was put into your GI tract during the procedure and reduce the bloating. If you had a lower endoscopy (such as a colonoscopy or flexible sigmoidoscopy) you may notice spotting of blood in your stool or on the toilet paper. If you underwent a bowel prep for your procedure, you may not have a normal bowel movement for a few days.  Please Note:  You might notice some irritation and congestion in your nose or some drainage.  This is from the oxygen used during your procedure.  There is no need for concern and it should clear up in a day or so.  SYMPTOMS TO REPORT IMMEDIATELY:  Following lower endoscopy (colonoscopy or flexible sigmoidoscopy):  Excessive amounts of blood in the stool  Significant tenderness or worsening of abdominal pains  Swelling of the abdomen that is new, acute  Fever of 100F or higher  For urgent or emergent issues, a gastroenterologist can be reached at any hour by calling [phone]. Do not use MyChart messaging for urgent concerns.    DIET:  We do recommend a small meal at first, but then you may proceed to your regular diet.  Drink plenty of fluids but you should avoid alcoholic beverages for 24 hours.  ACTIVITY:  You  should plan to take it easy for the rest of today and you should NOT DRIVE or use heavy machinery until tomorrow (because of the sedation medicines used during the test).    FOLLOW UP: Our staff will call the number listed on your records the next business day following your procedure.  We will call around 7:15- 8:00 am to check on you and address any questions or concerns that you may have regarding the information given to you following your procedure. If we do not reach you, we will leave a message.     If any biopsies were taken you will be contacted by phone or by letter within the next 1-3 weeks.  Please call us at [phone] if you have not heard about the biopsies in 3 weeks.    SIGNATURES/CONFIDENTIALITY: You and/or your care partner have signed paperwork which will be entered into your electronic medical record.  These signatures attest to the fact that that the information above on your After Visit Summary has been reviewed and is understood.  Full responsibility of the confidentiality of this discharge information lies with you and/or your care-partner.

## 2023-06-10 NOTE — Progress Notes (Signed)
 Called to room to assist during endoscopic procedure.  Patient ID and intended procedure confirmed with present staff. Received instructions for my participation in the procedure from the performing physician.

## 2023-06-10 NOTE — Progress Notes (Signed)
 Patient having difficulty passing gas and having abdominal pain. Levsin 0.25mg  administered. Simethicone administered. Patient still having abdominal pain with little gas passed. Provider notified.  Levsin administered. Patient ambulating to release some abdominal pain. Attempting  to release gas. After provider examination, patient able to move more gas. Patient states relief of pressure.

## 2023-06-10 NOTE — Op Note (Signed)
 [facility] Patient Name: Kari Welch Procedure Date: 06/10/2023 1:55 PM MRN: [id number] Endoscopist: Corliss Parish , MD, [id number] Age: 78 Referring MD:  Date of Birth: 1945-10-07 Gender: Female Account #: [id number] Procedure:                Colonoscopy Indications:              Screening for colorectal malignant neoplasm Medicines:                Monitored Anesthesia Care Procedure:                Pre-Anesthesia Assessment:                           - Prior to the procedure, a History and Physical                            was performed, and patient medications and                            allergies were reviewed. The patient's tolerance of                            previous anesthesia was also reviewed. The risks                            and benefits of the procedure and the sedation                            options and risks were discussed with the patient.                            All questions were answered, and informed consent                            was obtained. Prior Anticoagulants: The patient has                            taken no anticoagulant or antiplatelet agents. ASA                            Grade Assessment: II - A patient with mild systemic                            disease. After reviewing the risks and benefits,                            the patient was deemed in satisfactory condition to                            undergo the procedure.                           After obtaining informed consent, the colonoscope  was passed under direct vision. Throughout the                            procedure, the patient's blood pressure, pulse, and                            oxygen saturations were monitored continuously. The                            colonoscopy was somewhat difficult due to a                            redundant colon and significant looping. Successful                            completion of  the procedure was aided by changing                            the patient's position, using manual pressure,                            straightening and shortening the scope to obtain                            bowel loop reduction and using scope torsion. The                            patient tolerated the procedure. The quality of the                            bowel preparation was adequate. The terminal ileum,                            ileocecal valve, appendiceal orifice, and rectum                            were photographed. The Olympus Scope [id number] was                            introduced through the anus and advanced to the 3                            cm into the ileum. Scope In: 2:16:21 PM Scope Out: 2:37:27 PM Scope Withdrawal Time: 0 hours 11 minutes 14 seconds  Total Procedure Duration: 0 hours 21 minutes 6 seconds  Findings:                 The digital rectal exam findings include                            hemorrhoids. Pertinent negatives include no                            palpable rectal lesions.  The colon (entire examined portion) revealed                            significantly excessive looping.                           The terminal ileum and ileocecal valve appeared                            normal.                           Normal mucosa was found in the entire colon.                           Two sessile polyps were found in the transverse                            colon and hepatic flexure. The polyps were 3 to 4                            mm in size. These polyps were removed with a cold                            snare. Resection and retrieval were complete.                           Non-bleeding non-thrombosed internal hemorrhoids                            were found during retroflexion, during perianal                            exam and during digital exam. The hemorrhoids were                            Grade II  (internal hemorrhoids that prolapse but                            reduce spontaneously). Complications:            No immediate complications. Estimated Blood Loss:     Estimated blood loss was minimal. Impression:               - Hemorrhoids found on digital rectal exam.                           - There was significant looping of the colon.                           - The examined portion of the ileum was normal.                           - Normal mucosa in the entire examined colon.                           -  Two 3 to 4 mm polyps in the transverse colon and                            at the hepatic flexure, removed with a cold snare.                            Resected and retrieved.                           - Non-bleeding non-thrombosed internal hemorrhoids. Recommendation:           - The patient will be observed post-procedure,                            until all discharge criteria are met.                           - Discharge patient to home.                           - Patient has a contact number available for                            emergencies. The signs and symptoms of potential                            delayed complications were discussed with the                            patient. Return to normal activities tomorrow.                            Written discharge instructions were provided to the                            patient.                           - High fiber diet.                           - Use FiberCon 1-2 tablets PO daily.                           - Continue present medications.                           - Await pathology results.                           - Repeat colonoscopy in 08/04/08 years for                            surveillance based on pathology results but patient  will be at least 42 years of age or older so this                            may be her last colonoscopy.                           - The findings and  recommendations were discussed                            with the patient.                           - The findings and recommendations were discussed                            with the patient's family. Corliss Parish, MD 06/10/2023 2:50:22 PM

## 2023-06-10 NOTE — Progress Notes (Signed)
 GASTROENTEROLOGY PROCEDURE H&P NOTE   Primary Care Physician: Shelva Majestic, MD  HPI: Kari Welch is a 78 y.o. female who presents for Colonoscopy for screening.  Past Medical History:  Diagnosis Date   Abnormal Pap smear    h/o   Allergy    seasonal   Arthritis    Collagen vascular disease (HCC)    Elevated LFTs    Work up negative - Dr. Juanda Chance   Hyperlipidemia    Hyperplastic colon polyp    Hypertension    Internal hemorrhoids    INTERNAL HEMORRHOIDS 01/24/2009   Qualifier: Diagnosis of  By: Candice Camp CMA (AAMA), Dottie     Migraine    Prothrombin gene mutation (HCC)    heterozygous   PVD (peripheral vascular disease) (HCC)    takes Pletal to open blood flow of blood to fingers, toes, and lower extremities   Raynaud's disease    Vasculitis (HCC)    Venous insufficiency    superficial left leg- seeing a vein specialist in August   Past Surgical History:  Procedure Laterality Date   ABDOMINAL HYSTERECTOMY  1979   TAH/LSO   ABLATION SAPHENOUS VEIN W/ RFA     BIOPSY  11/08/2022   Procedure: BIOPSY;  Surgeon: Lemar Lofty., MD;  Location: [facility];  Service: Gastroenterology;;   BREAST BIOPSY Left 10/27/2022   MM LT BREAST BX W LOC DEV 1ST LESION IMAGE BX SPEC STEREO GUIDE 10/27/2022 [facility]   BUNIONECTOMY WITH HAMMERTOE RECONSTRUCTION Left 10/13/2013   Procedure: LEFT FIRST METATARSAL SCARF OSTEOTOMY;  LEFT MODIFIED MCBRIDE BUNIONECTOMY AND SECOND HAMMERTOE CORRECTION;  Surgeon: Toni Arthurs, MD;  Location: [facility];  Service: Orthopedics;  Laterality: Left;   CATARACT EXTRACTION Bilateral 09/2014   Dr. Vonna Kotyk   CERVIX LESION DESTRUCTION  1970   CHOLECYSTECTOMY N/A 11/09/2022   Procedure: LAPAROSCOPIC CHOLECYSTECTOMY;  Surgeon: Fritzi Mandes, MD;  Location: [facility];  Service: General;  Laterality: N/A;   COLONOSCOPY     COLONOSCOPY     DILATION AND CURETTAGE OF UTERUS     ERCP N/A 11/08/2022   Procedure: ENDOSCOPIC  RETROGRADE CHOLANGIOPANCREATOGRAPHY (ERCP);  Surgeon: Lemar Lofty., MD;  Location: [facility];  Service: Gastroenterology;  Laterality: N/A;   ESOPHAGOGASTRODUODENOSCOPY N/A 11/08/2022   Procedure: ESOPHAGOGASTRODUODENOSCOPY (EGD);  Surgeon: Lemar Lofty., MD;  Location: [facility];  Service: Gastroenterology;  Laterality: N/A;   LASER ABLATION Bilateral    leg-left 8/14, right 12/14   MASS EXCISION Left    hand   osteopenia of hip  2017   T score -1.1   PELVIC LAPAROSCOPY     d/t infertility   REMOVAL OF STONES  11/08/2022   Procedure: REMOVAL OF STONES;  Surgeon: Meridee Score Netty Starring., MD;  Location: [facility];  Service: Gastroenterology;;   right bartholin's gland cyst excision Right    1990s   SPHINCTEROTOMY  11/08/2022   Procedure: SPHINCTEROTOMY;  Surgeon: Lemar Lofty., MD;  Location: [facility];  Service: Gastroenterology;;   sympthectomy Left 2008   hand ([facility])   Current Outpatient Medications  Medication Sig Dispense Refill   acetaminophen (TYLENOL) 500 MG tablet Take 1,000 mg by mouth every 6 (six) hours as needed for mild pain.     albuterol (PROAIR HFA) 108 (90 Base) MCG/ACT inhaler Inhale 1-2 puffs into the lungs every 6 (six) hours as needed for wheezing or shortness of breath. 18 g 0   ALPRAZolam (XANAX) 0.5 MG tablet Take 1 tablet (0.5 mg total)  by mouth every 8 (eight) hours as needed for anxiety. Do not drive for 8 hours after use. 30 tablet 0   amLODipine (NORVASC) 5 MG tablet Take 1 tablet (5 mg total) by mouth 2 (two) times daily. 180 tablet 3   aspirin 81 MG chewable tablet Chew 81 mg by mouth daily.     budesonide-formoterol (SYMBICORT) 160-4.5 MCG/ACT inhaler Inhale 2 puffs into the lungs 2 (two) times daily. 10.2 g 3   cilostazol (PLETAL) 100 MG tablet Take 1 tablet (100 mg total) by mouth 2 (two) times daily 180 tablet 3   cyanocobalamin 1000 MCG tablet Take 1,000 mcg by mouth daily.     EPINEPHrine 0.3 mg/0.3 mL IJ SOAJ  injection Inject 0.3 mg into the muscle as needed for anaphylaxis. 2 each 0   estradiol (CLIMARA - DOSED IN MG/24 HR) 0.0375 mg/24hr patch Place 1 patch onto the skin once a week 12 patch 3   FLUoxetine (PROZAC) 40 MG capsule Take 1 capsule (40 mg total) by mouth daily. 90 capsule 3   HYDROcodone-acetaminophen (NORCO) 10-325 MG tablet TAKE (1) TABLET EVERY FOUR TO SIX HOURS AS NEEDED. Do not drive for 6 hours after use. 30 tablet 0   isosorbide mononitrate (IMDUR) 60 MG 24 hr tablet Take 1 tablet (60 mg total) by mouth daily. 90 tablet 3   ondansetron (ZOFRAN-ODT) 4 MG disintegrating tablet Take 1 tablet (4 mg total) by mouth every 8 (eight) hours as needed for nausea or vomiting. 20 tablet 0   potassium chloride SA (KLOR-CON M) 20 MEQ tablet Take 1 tablet (20 mEq total) by mouth daily. 90 tablet 1   Rimegepant Sulfate (NURTEC) 75 MG TBDP Take 75 mg by mouth daily as needed. 16 tablet 5   rosuvastatin (CRESTOR) 10 MG tablet Take 1 tablet (10 mg total) by mouth once daily 90 tablet 3   triamterene-hydrochlorothiazide (MAXZIDE-25) 37.5-25 MG tablet Take 1 tablet by mouth once daily 90 tablet 3   No current facility-administered medications for this visit.    Current Outpatient Medications:    acetaminophen (TYLENOL) 500 MG tablet, Take 1,000 mg by mouth every 6 (six) hours as needed for mild pain., Disp: , Rfl:    albuterol (PROAIR HFA) 108 (90 Base) MCG/ACT inhaler, Inhale 1-2 puffs into the lungs every 6 (six) hours as needed for wheezing or shortness of breath., Disp: 18 g, Rfl: 0   ALPRAZolam (XANAX) 0.5 MG tablet, Take 1 tablet (0.5 mg total) by mouth every 8 (eight) hours as needed for anxiety. Do not drive for 8 hours after use., Disp: 30 tablet, Rfl: 0   amLODipine (NORVASC) 5 MG tablet, Take 1 tablet (5 mg total) by mouth 2 (two) times daily., Disp: 180 tablet, Rfl: 3   aspirin 81 MG chewable tablet, Chew 81 mg by mouth daily., Disp: , Rfl:    budesonide-formoterol (SYMBICORT) 160-4.5  MCG/ACT inhaler, Inhale 2 puffs into the lungs 2 (two) times daily., Disp: 10.2 g, Rfl: 3   cilostazol (PLETAL) 100 MG tablet, Take 1 tablet (100 mg total) by mouth 2 (two) times daily, Disp: 180 tablet, Rfl: 3   cyanocobalamin 1000 MCG tablet, Take 1,000 mcg by mouth daily., Disp: , Rfl:    EPINEPHrine 0.3 mg/0.3 mL IJ SOAJ injection, Inject 0.3 mg into the muscle as needed for anaphylaxis., Disp: 2 each, Rfl: 0   estradiol (CLIMARA - DOSED IN MG/24 HR) 0.0375 mg/24hr patch, Place 1 patch onto the skin once a week, Disp: 12 patch, Rfl:  3   FLUoxetine (PROZAC) 40 MG capsule, Take 1 capsule (40 mg total) by mouth daily., Disp: 90 capsule, Rfl: 3   HYDROcodone-acetaminophen (NORCO) 10-325 MG tablet, TAKE (1) TABLET EVERY FOUR TO SIX HOURS AS NEEDED. Do not drive for 6 hours after use., Disp: 30 tablet, Rfl: 0   isosorbide mononitrate (IMDUR) 60 MG 24 hr tablet, Take 1 tablet (60 mg total) by mouth daily., Disp: 90 tablet, Rfl: 3   ondansetron (ZOFRAN-ODT) 4 MG disintegrating tablet, Take 1 tablet (4 mg total) by mouth every 8 (eight) hours as needed for nausea or vomiting., Disp: 20 tablet, Rfl: 0   potassium chloride SA (KLOR-CON M) 20 MEQ tablet, Take 1 tablet (20 mEq total) by mouth daily., Disp: 90 tablet, Rfl: 1   Rimegepant Sulfate (NURTEC) 75 MG TBDP, Take 75 mg by mouth daily as needed., Disp: 16 tablet, Rfl: 5   rosuvastatin (CRESTOR) 10 MG tablet, Take 1 tablet (10 mg total) by mouth once daily, Disp: 90 tablet, Rfl: 3   triamterene-hydrochlorothiazide (MAXZIDE-25) 37.5-25 MG tablet, Take 1 tablet by mouth once daily, Disp: 90 tablet, Rfl: 3 Allergies  Allergen Reactions   Methylprednisolone     With IM injection- Panic attacks, trouble with concentration, hyperactive, hyperventilation   Prednisone Swelling    short course=swelling   Family History  Problem Relation Age of Onset   Stroke Mother    Colon cancer Mother 101       dec with colon ca   Heart attack Mother    Migraines  Mother    Stroke Father    Heart attack Father    Gallbladder disease Father 66       septic gallbladder   Rheum arthritis Brother        dec age 51 ?Rheumatoid arthritis   Diabetes Maternal Grandmother    Esophageal cancer Neg Hx    Stomach cancer Neg Hx    Rectal cancer Neg Hx    Social History   Socioeconomic History   Marital status: Married    Spouse name: Not on file   Number of children: Not on file   Years of education: Not on file   Highest education level: Not on file  Occupational History   Not on file  Tobacco Use   Smoking status: Former    Current packs/day: 0.00    Average packs/day: 0.5 packs/day for 18.0 years (9.0 ttl pk-yrs)    Types: Cigarettes    Start date: 10/11/1970    Quit date: 10/10/1988    Years since quitting: 34.6   Smokeless tobacco: Never  Substance and Sexual Activity   Alcohol use: Yes    Alcohol/week: 2.0 standard drinks of alcohol    Types: 2 Standard drinks or equivalent per week    Comment: occ glass of wine   Drug use: No   Sexual activity: Yes    Partners: Male    Birth control/protection: Surgical    Comment: TAH  Other Topics Concern   Not on file  Social History Narrative   Married 28 years   3 adopted children   Social Drivers of Corporate investment banker Strain: Not on file  Food Insecurity: No Food Insecurity (11/10/2022)   Hunger Vital Sign    Worried About Running Out of Food in the Last Year: Never true    Ran Out of Food in the Last Year: Never true  Transportation Needs: No Transportation Needs (11/10/2022)   PRAPARE - Transportation  Lack of Transportation (Medical): No    Lack of Transportation (Non-Medical): No  Physical Activity: Not on file  Stress: Not on file  Social Connections: Not on file  Intimate Partner Violence: Not At Risk (11/07/2022)   Humiliation, Afraid, Rape, and Kick questionnaire    Fear of Current or Ex-Partner: No    Emotionally Abused: No    Physically Abused: No    Sexually  Abused: No    Physical Exam: There were no vitals filed for this visit. There is no height or weight on file to calculate BMI. GEN: NAD EYE: Sclerae anicteric ENT: MMM CV: Non-tachycardic GI: Soft, NT/ND NEURO:  Alert & Oriented x 3  Lab Results: No results for input(s): "WBC", "HGB", "HCT", "PLT" in the last 72 hours. BMET No results for input(s): "NA", "K", "CL", "CO2", "GLUCOSE", "BUN", "CREATININE", "CALCIUM" in the last 72 hours. LFT No results for input(s): "PROT", "ALBUMIN", "AST", "ALT", "ALKPHOS", "BILITOT", "BILIDIR", "IBILI" in the last 72 hours. PT/INR No results for input(s): "LABPROT", "INR" in the last 72 hours.   Impression / Plan: This is a 78 y.o.female who presents for Colonoscopy for screening.  The risks and benefits of endoscopic evaluation/treatment were discussed with the patient and/or family; these include but are not limited to the risk of perforation, infection, bleeding, missed lesions, lack of diagnosis, severe illness requiring hospitalization, as well as anesthesia and sedation related illnesses.  The patient's history has been reviewed, patient examined, no change in status, and deemed stable for procedure.  The patient and/or family is agreeable to proceed.    Corliss Parish, MD [facility] Office # [phone]

## 2023-06-10 NOTE — Progress Notes (Signed)
 Sedate, gd SR, tolerated procedure well, VSS, report to RN

## 2023-06-10 NOTE — Telephone Encounter (Signed)
-----   Message from [facility] sent at 06/10/2023  4:41 PM EDT ----- Regarding: Follow-up Team, Please follow-up with this patient tomorrow to see how she is done post colonoscopy.  She had some discomfort postprocedure which improved after time and multiple maneuvers. Please update as able. Thanks. GM

## 2023-06-10 NOTE — Progress Notes (Signed)
 Patient had been doing well after I had discussed with her and husband the results. I completed my next procedure and was told patient was experieincing some continued abdominal distention/discomfort. Had received 1 dose of Levsin. Examination shows evidence of mild abdominal distention with NABS. Patient without rebound tenderness. She states discomfort is 5/10 at this point in time. Will give another dose of Levsin. Will allow her to walk to the bathroom to see if this will help with passage of gas. If it does not, then I will consider repeat colonoscopic evaluation to remove/reduce any further air that may be in the colon. If she were to continue to have issues, will need to go to the ED for further evaluation and rule out perforation or splenic laceration or other issues. Patient and husband agree with this plan of action.   Corliss Parish, MD [facility] Office # [phone]    [phone] Addendum Patient has been able to pass a bit more gas by being on All-4s, and walking to and from the bathroom. Performed a digital exam with RN and with patient's husband in room to try and stimulate her further. She is less distended in her lower abdomen, certainly since completion of her procedure. She was able to press on her abdomen with less discomfort. I will give a dose of Simethicone as well. I will re-evaluate to see if decompression is required or transfer to the ED vs discharge home.   Corliss Parish, MD [facility] Office # [phone]   [phone] Addendum Patient feels much better with minimal discomfort. Discussed return to hospital or issues that may require discussion overnight with covering MD but if progressive pain were to occur, they know she will need to come to the emergency department for further evaluation.  Corliss Parish, MD [facility] Office # [phone]

## 2023-06-11 ENCOUNTER — Telehealth: Payer: Self-pay | Admitting: *Deleted

## 2023-06-11 NOTE — Telephone Encounter (Signed)
 Thanks for the update Patty.  I am happy to hear that she is doing well. GM

## 2023-06-11 NOTE — Telephone Encounter (Signed)
  Follow up Call-     06/10/2023    1:51 PM  Call back number  Post procedure Call Back phone  # [phone]  Permission to leave phone message Yes     Patient questions:  Do you have a fever, pain , or abdominal swelling? No. Pain Score  0 *  Have you tolerated food without any problems? Yes.    Have you been able to return to your normal activities? Yes.    Do you have any questions about your discharge instructions: Diet   No. Medications  No. Follow up visit  No.  Do you have questions or concerns about your Care? No.  Actions: * If pain score is 4 or above: No action needed, pain <4.

## 2023-06-11 NOTE — Telephone Encounter (Signed)
 I spoke with the pt over the phone and got an update. She tells me that she is doing very well today.  No complaints. She will call if she does not continue to improve.

## 2023-06-15 ENCOUNTER — Encounter: Payer: Self-pay | Admitting: Gastroenterology

## 2023-06-15 LAB — SURGICAL PATHOLOGY

## 2023-06-22 ENCOUNTER — Other Ambulatory Visit: Payer: Self-pay

## 2023-06-22 ENCOUNTER — Other Ambulatory Visit (HOSPITAL_BASED_OUTPATIENT_CLINIC_OR_DEPARTMENT_OTHER): Payer: Self-pay

## 2023-06-22 DIAGNOSIS — I739 Peripheral vascular disease, unspecified: Secondary | ICD-10-CM | POA: Diagnosis not present

## 2023-06-22 DIAGNOSIS — I872 Venous insufficiency (chronic) (peripheral): Secondary | ICD-10-CM | POA: Diagnosis not present

## 2023-06-22 DIAGNOSIS — I7300 Raynaud's syndrome without gangrene: Secondary | ICD-10-CM | POA: Diagnosis not present

## 2023-06-22 DIAGNOSIS — E782 Mixed hyperlipidemia: Secondary | ICD-10-CM | POA: Diagnosis not present

## 2023-06-22 MED ORDER — AMLODIPINE BESYLATE 5 MG PO TABS
5.0000 mg | ORAL_TABLET | Freq: Two times a day (BID) | ORAL | 3 refills | Status: AC
  Filled 2023-06-22: qty 180, 90d supply, fill #0
  Filled 2023-09-16: qty 180, 90d supply, fill #1
  Filled 2023-12-16: qty 180, 90d supply, fill #2
  Filled 2024-03-04: qty 180, 90d supply, fill #3

## 2023-06-22 MED ORDER — ISOSORBIDE MONONITRATE ER 60 MG PO TB24
60.0000 mg | ORAL_TABLET | Freq: Every day | ORAL | 3 refills | Status: AC
  Filled 2023-06-22: qty 90, 90d supply, fill #0
  Filled 2024-01-01: qty 90, 90d supply, fill #1
  Filled 2024-03-18 (×2): qty 90, 90d supply, fill #2

## 2023-06-22 MED ORDER — FLUOXETINE HCL 40 MG PO CAPS
40.0000 mg | ORAL_CAPSULE | Freq: Every day | ORAL | 3 refills | Status: AC
  Filled 2023-06-22: qty 90, 90d supply, fill #0
  Filled 2023-12-16: qty 90, 90d supply, fill #1
  Filled 2024-03-04: qty 90, 90d supply, fill #2

## 2023-06-22 MED ORDER — TRIAMTERENE-HCTZ 37.5-25 MG PO TABS
1.0000 | ORAL_TABLET | Freq: Every day | ORAL | 3 refills | Status: DC
Start: 2023-06-22 — End: 2023-08-12
  Filled 2023-06-22: qty 90, 90d supply, fill #0

## 2023-06-22 MED ORDER — ESTRADIOL 0.0375 MG/24HR TD PTWK
0.0375 mg | MEDICATED_PATCH | TRANSDERMAL | 3 refills | Status: AC
  Filled 2023-06-22: qty 12, 84d supply, fill #0
  Filled 2023-09-16: qty 12, 84d supply, fill #1
  Filled 2023-12-16: qty 12, 84d supply, fill #2
  Filled 2024-03-04: qty 12, 84d supply, fill #3

## 2023-06-22 MED ORDER — ROSUVASTATIN CALCIUM 10 MG PO TABS
10.0000 mg | ORAL_TABLET | Freq: Every day | ORAL | 3 refills | Status: DC
  Filled 2023-06-22: qty 45, 45d supply, fill #0

## 2023-06-23 ENCOUNTER — Other Ambulatory Visit (HOSPITAL_COMMUNITY): Payer: Self-pay

## 2023-06-23 ENCOUNTER — Other Ambulatory Visit (HOSPITAL_BASED_OUTPATIENT_CLINIC_OR_DEPARTMENT_OTHER): Payer: Self-pay

## 2023-06-23 MED ORDER — CILOSTAZOL 100 MG PO TABS
100.0000 mg | ORAL_TABLET | Freq: Two times a day (BID) | ORAL | 3 refills | Status: AC
  Filled 2023-06-23: qty 180, 90d supply, fill #0
  Filled 2023-10-16: qty 180, 90d supply, fill #1
  Filled 2024-01-09: qty 180, 90d supply, fill #2
  Filled 2024-03-21 – 2024-03-22 (×3): qty 180, 90d supply, fill #3

## 2023-06-23 MED ORDER — ROSUVASTATIN CALCIUM 10 MG PO TABS
ORAL_TABLET | ORAL | 3 refills | Status: AC
  Filled 2023-06-23: qty 135, 90d supply, fill #0
  Filled 2023-09-16: qty 135, 90d supply, fill #1
  Filled 2023-12-24: qty 135, 90d supply, fill #2
  Filled 2024-03-18: qty 135, 90d supply, fill #3

## 2023-06-23 MED ORDER — POTASSIUM CHLORIDE CRYS ER 20 MEQ PO TBCR
20.0000 meq | EXTENDED_RELEASE_TABLET | Freq: Two times a day (BID) | ORAL | 3 refills | Status: DC
  Filled 2023-06-23: qty 180, 90d supply, fill #0

## 2023-06-25 ENCOUNTER — Other Ambulatory Visit: Payer: Self-pay | Admitting: Interventional Radiology

## 2023-06-25 DIAGNOSIS — I872 Venous insufficiency (chronic) (peripheral): Secondary | ICD-10-CM

## 2023-07-10 ENCOUNTER — Encounter: Payer: Medicare Other | Admitting: Family Medicine

## 2023-07-15 IMAGING — MG DIGITAL DIAGNOSTIC UNILAT LEFT W/ CAD
6 series · 6 of 14 positions shown · non-contrast
Comparison: Previous exam(s).

CLINICAL DATA: 75-year-old female presenting for follow-up left
breast likely benign calcifications.

EXAM:
DIGITAL DIAGNOSTIC UNILATERAL LEFT MAMMOGRAM WITH CAD
TECHNIQUE: Left digital diagnostic mammography was performed. Mammographic
images were processed with CAD.

[L ML]
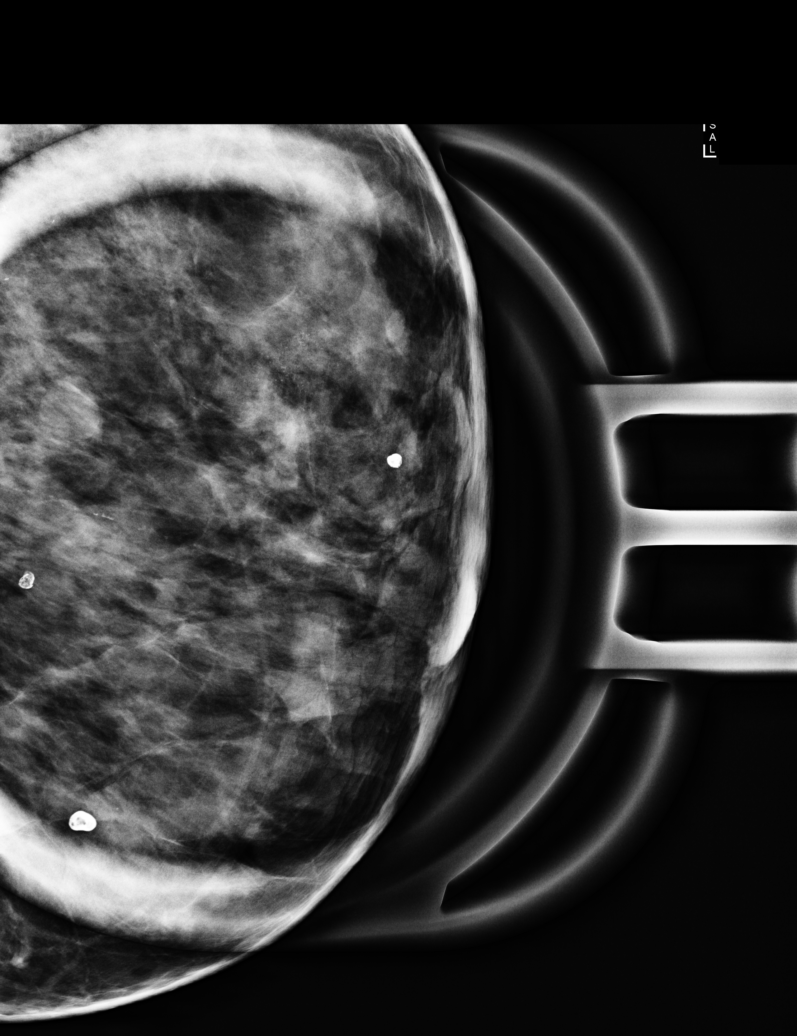

[L CC]
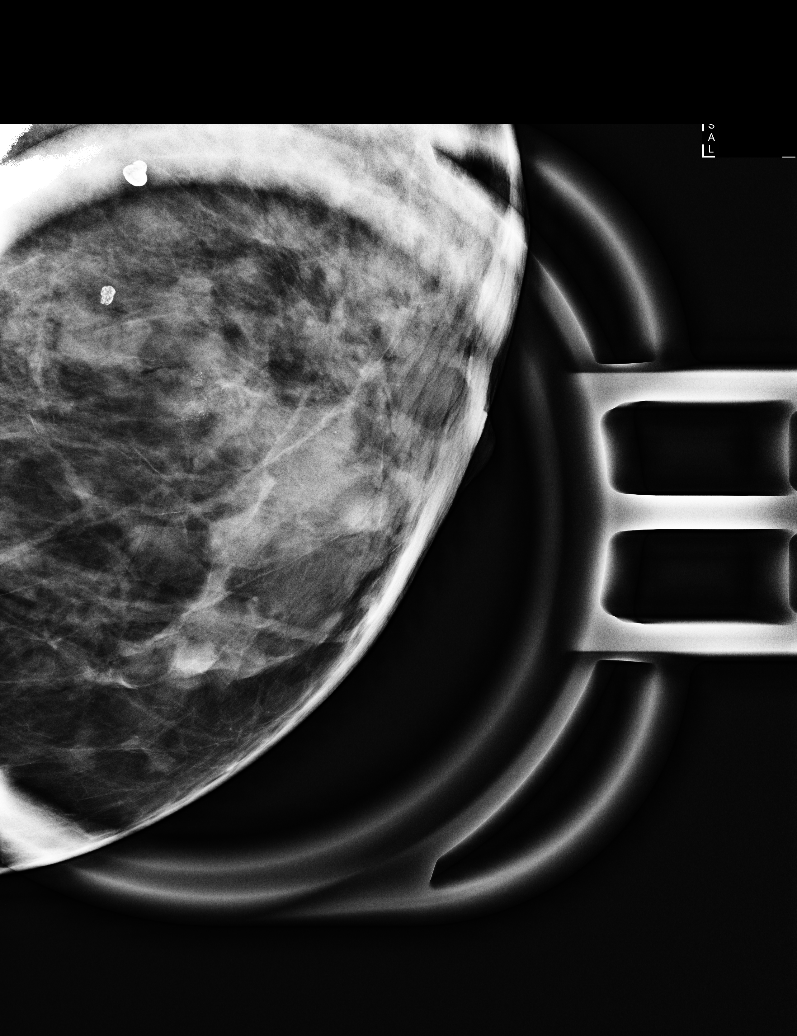

[L CC synth-2D]
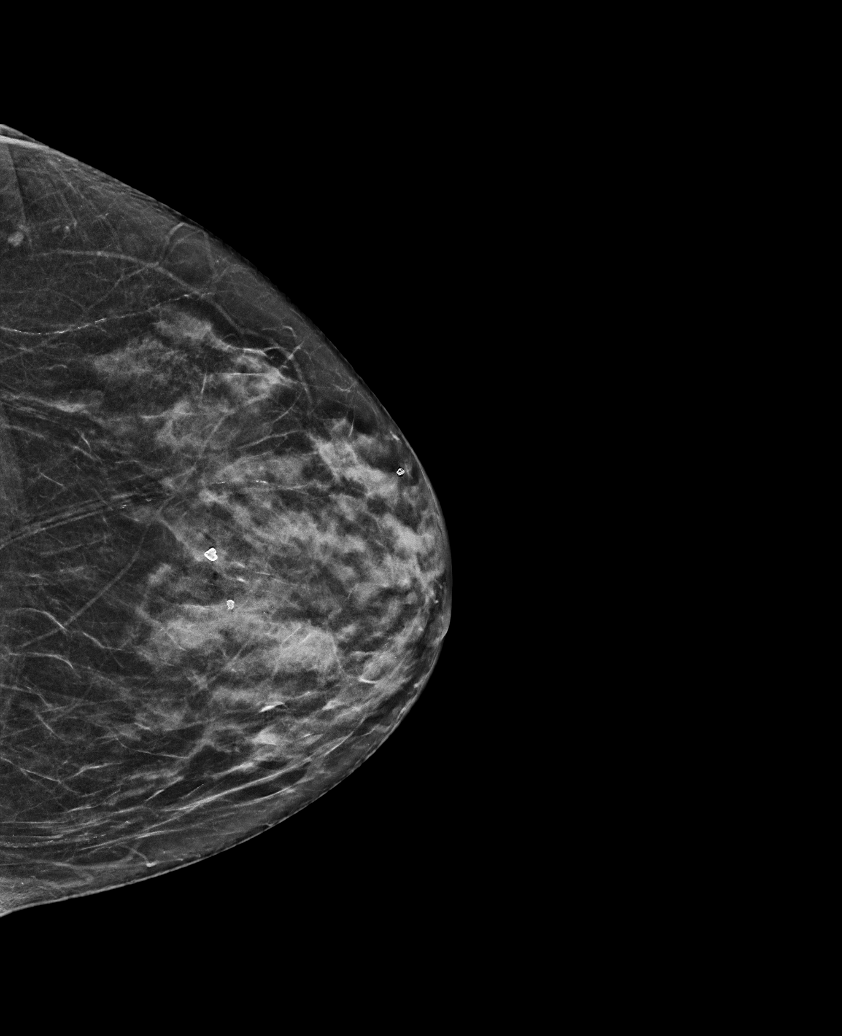

[L MLO synth-2D]
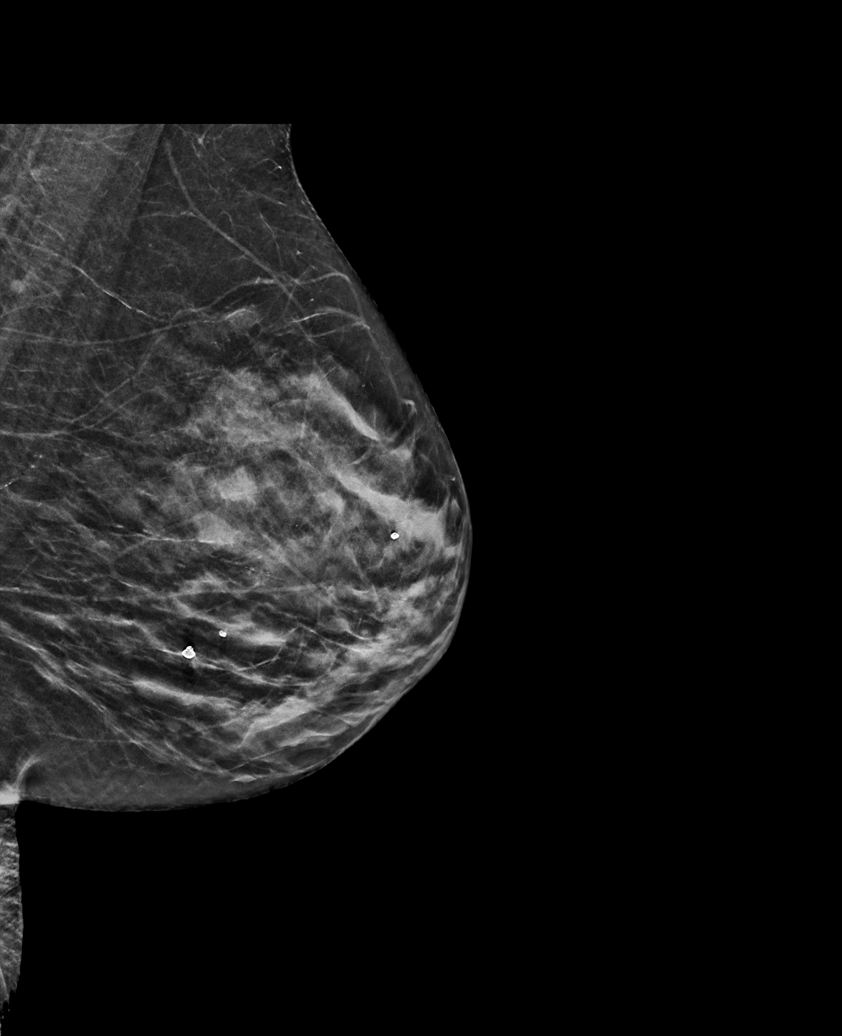

[L MLO tomo · tomo slice 26/51.0]
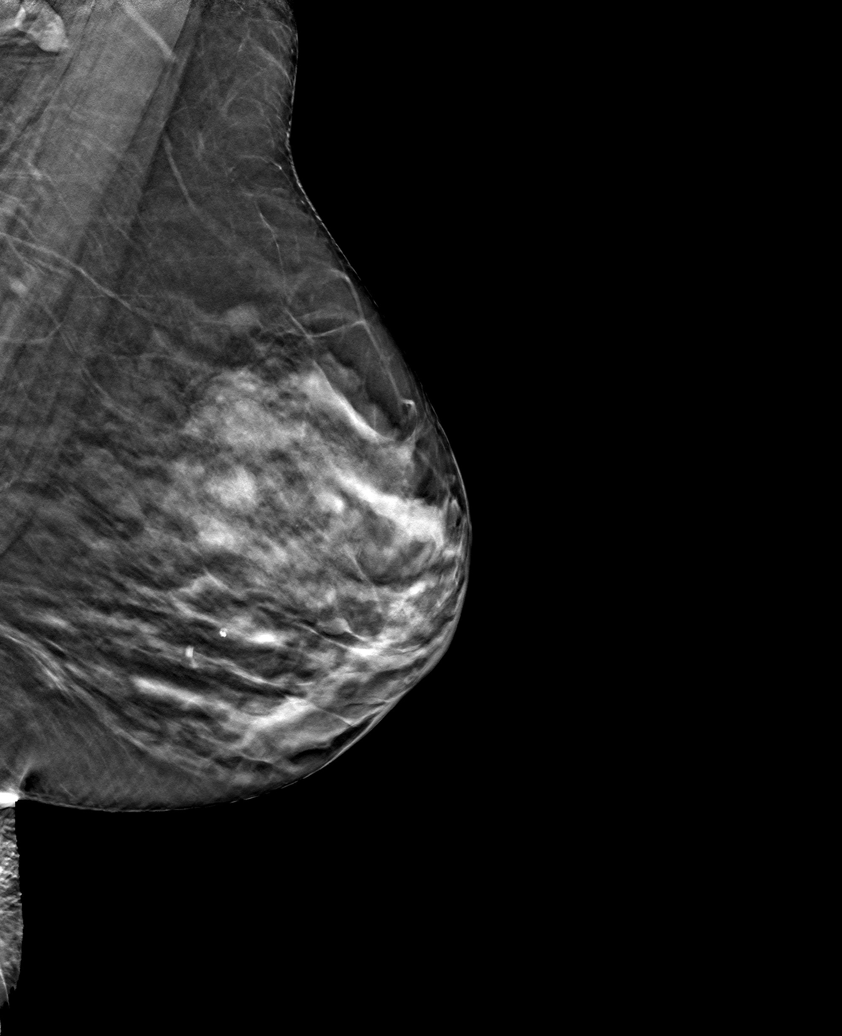

[L CC tomo · tomo slice 27/53.0]
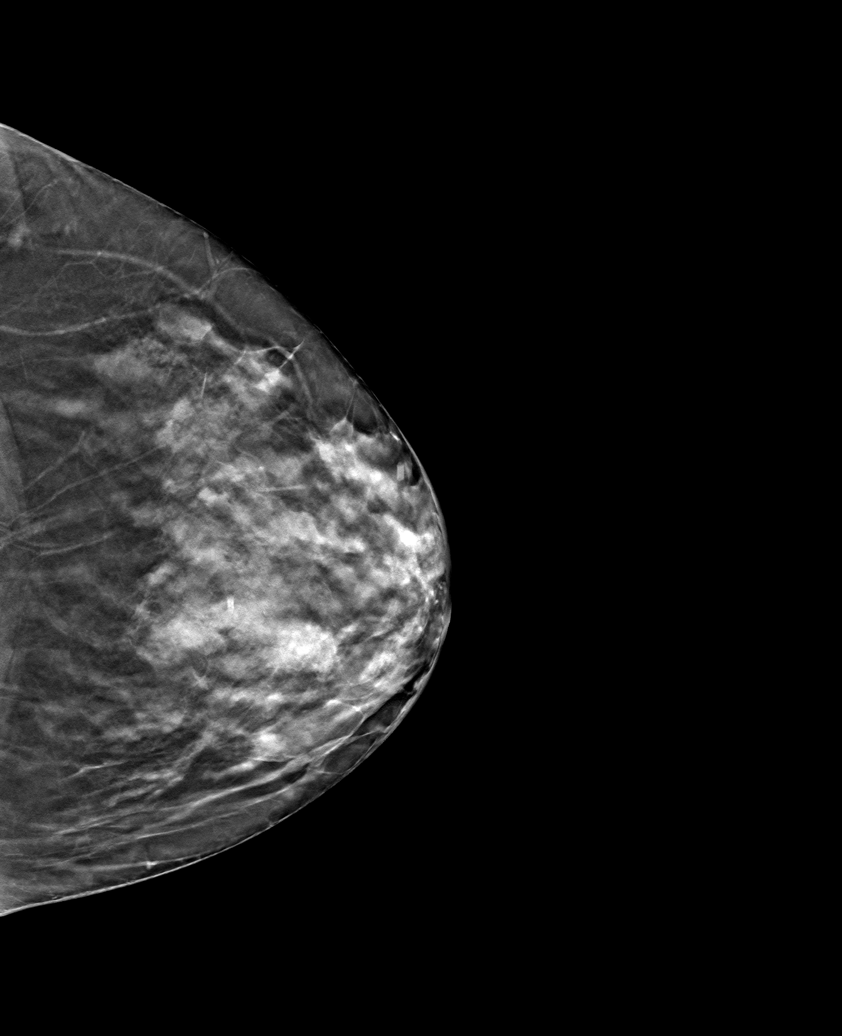

[6 of 14 positions shown; findings below may reference images not displayed]

ACR Breast Density Category c: The breast tissue is heterogeneously
dense, which may obscure small masses.
FINDINGS: Spot compression magnification images through the upper inner
quadrant of the left breast demonstrates a stable group of amorphous
calcifications spanning approximately 1.0 cm.
IMPRESSION: The likely benign left breast calcifications are stable.

RECOMMENDATION:
Bilateral diagnostic mammography recommended in Tuesday September, 2021.

I have discussed the findings and recommendations with the patient.
If applicable, a reminder letter will be sent to the patient
regarding the next appointment.

BI-RADS CATEGORY  3: Probably benign.

## 2023-07-17 ENCOUNTER — Ambulatory Visit
Admission: RE | Admit: 2023-07-17 | Discharge: 2023-07-17 | Disposition: A | Discharge status: Home / Self Care | Source: Ambulatory Visit | Attending: Interventional Radiology | Admitting: Interventional Radiology

## 2023-07-17 ENCOUNTER — Inpatient Hospital Stay: Admission: RE | Admit: 2023-07-17 | Source: Ambulatory Visit

## 2023-07-17 ENCOUNTER — Other Ambulatory Visit

## 2023-07-17 DIAGNOSIS — I872 Venous insufficiency (chronic) (peripheral): Secondary | ICD-10-CM

## 2023-07-17 DIAGNOSIS — I83893 Varicose veins of bilateral lower extremities with other complications: Secondary | ICD-10-CM | POA: Diagnosis not present

## 2023-07-17 DIAGNOSIS — I82813 Embolism and thrombosis of superficial veins of lower extremities, bilateral: Secondary | ICD-10-CM | POA: Diagnosis not present

## 2023-07-17 NOTE — Progress Notes (Signed)
 Chief Complaint: Patient was seen in consultation today for superficial venous insufficiency, subsequent encounter at the request of Kari Welch  Referring Physician(s): Kari Welch  History of Present Illness: Kari Welch is a 78 y.o. female very well known to me with with bilateral lower extremity symptomatic venous insufficiency and varicosities as well as a nonspecific arterial vasculitis. She is now status post bilateral lower extremity endovenous laser ablation of the great saphenous veins (Left treated 11/17/12 and Right treated 03/02/13, and right anterior accessory GSV treated 03/02/14) with supplemental sclerotherapy.    Subsequently did extremely well.  The last I saw her was in July of 2016.  Her symptoms remained essentially resolved for approximately 8 years, but over this past year she has slowly developed recurrent significant symptoms.  Approximately a year ago she began noticing more bulging venous varicosities, particularly along her right thigh, knee and calf.  Her symptoms have now progressed to the point that despite frequent use of her thigh-high surgical grade compression hose she continues to have ankle swelling and painful bulging of the venous varicosities accompanied by aching, stinging and itching.   Overall, her right leg symptoms are worse than her left.  She returns today along with her husband to see if she has recurrent venous insufficiency than it there is any further treatment the can be offered.   Past Medical History:  Diagnosis Date   Abnormal Pap smear    h/o   Allergy    seasonal   Arthritis    Collagen vascular disease (HCC)    Elevated LFTs    Work up negative - Dr. Grandville Lax   Hyperlipidemia    Hyperplastic colon polyp    Hypertension    Internal hemorrhoids    INTERNAL HEMORRHOIDS 01/24/2009   Qualifier: Diagnosis of  By: Misty Amour CMA (AAMA), Dottie     Migraine    Prothrombin gene mutation (HCC)    heterozygous    PVD (peripheral vascular disease) (HCC)    takes Pletal  to open blood flow of blood to fingers, toes, and lower extremities   Raynaud's disease    Vasculitis (HCC)    Venous insufficiency    superficial left leg- seeing a vein specialist in August    Past Surgical History:  Procedure Laterality Date   ABDOMINAL HYSTERECTOMY  1979   TAH/LSO   ABLATION SAPHENOUS VEIN W/ RFA     BIOPSY  11/08/2022   Procedure: BIOPSY;  Surgeon: Normie Becton., MD;  Location: [facility];  Service: Gastroenterology;;   BREAST BIOPSY Left 10/27/2022   MM LT BREAST BX W LOC DEV 1ST LESION IMAGE BX SPEC STEREO GUIDE 10/27/2022 [facility]   BUNIONECTOMY WITH HAMMERTOE RECONSTRUCTION Left 10/13/2013   Procedure: LEFT FIRST METATARSAL SCARF OSTEOTOMY;  LEFT MODIFIED MCBRIDE BUNIONECTOMY AND SECOND HAMMERTOE CORRECTION;  Surgeon: Amada Backer, MD;  Location: [facility];  Service: Orthopedics;  Laterality: Left;   CATARACT EXTRACTION Bilateral 09/2014   Dr. Lenna Quinton   CERVIX LESION DESTRUCTION  1970   CHOLECYSTECTOMY N/A 11/09/2022   Procedure: LAPAROSCOPIC CHOLECYSTECTOMY;  Surgeon: Lujean Sake, MD;  Location: [facility];  Service: General;  Laterality: N/A;   COLONOSCOPY     COLONOSCOPY     DILATION AND CURETTAGE OF UTERUS     ERCP N/A 11/08/2022   Procedure: ENDOSCOPIC RETROGRADE CHOLANGIOPANCREATOGRAPHY (ERCP);  Surgeon: Normie Becton., MD;  Location: [facility];  Service: Gastroenterology;  Laterality: N/A;   ESOPHAGOGASTRODUODENOSCOPY N/A 11/08/2022   Procedure: ESOPHAGOGASTRODUODENOSCOPY (EGD);  Surgeon: Normie Becton., MD;  Location: [facility];  Service: Gastroenterology;  Laterality: N/A;   LASER ABLATION Bilateral    leg-left 8/14, right 12/14   MASS EXCISION Left    hand   osteopenia of hip  2017   T score -1.1   PELVIC LAPAROSCOPY     d/t infertility   REMOVAL OF STONES  11/08/2022   Procedure: REMOVAL OF STONES;  Surgeon: Brice Campi Albino Alu., MD;   Location: [facility];  Service: Gastroenterology;;   right bartholin's gland cyst excision Right    1990s   SPHINCTEROTOMY  11/08/2022   Procedure: SPHINCTEROTOMY;  Surgeon: Mansouraty, Albino Alu., MD;  Location: [facility];  Service: Gastroenterology;;   sympthectomy Left 2008   hand ([facility])    Allergies: Methylprednisolone  and Prednisone   Medications: Prior to Admission medications   Medication Sig Start Date End Date Taking? Authorizing Provider  acetaminophen  (TYLENOL ) 500 MG tablet Take 1,000 mg by mouth every 6 (six) hours as needed for mild pain.    [provider]  albuterol  (PROAIR  HFA) 108 (90 Base) MCG/ACT inhaler Inhale 1-2 puffs into the lungs every 6 (six) hours as needed for wheezing or shortness of breath. 08/06/21   Almira Jaeger, MD  ALPRAZolam  (XANAX ) 0.5 MG tablet Take 1 tablet (0.5 mg total) by mouth every 8 (eight) hours as needed for anxiety. Do not drive for 8 hours after use. 05/12/23   Almira Jaeger, MD  amLODipine  (NORVASC ) 5 MG tablet Take 1 tablet (5 mg total) by mouth 2 (two) times daily. 06/16/22     amLODipine  (NORVASC ) 5 MG tablet Take 1 tablet (5 mg total) by mouth 2 (two) times daily. 06/22/23   Britton Cane  aspirin  81 MG chewable tablet Chew 81 mg by mouth daily.    [provider]  budesonide -formoterol  (SYMBICORT ) 160-4.5 MCG/ACT inhaler Inhale 2 puffs into the lungs 2 (two) times daily. 01/29/23   Almira Jaeger, MD  calcium  elemental as carbonate (BARIATRIC TUMS ULTRA) 400 MG chewable tablet Chew by mouth.    [provider]  cilostazol  (PLETAL ) 100 MG tablet Take 1 tablet (100 mg total) by mouth 2 (two) times daily 06/16/22     cilostazol  (PLETAL ) 100 MG tablet Take 1 tablet (100 mg total) by mouth 2 (two) times daily. 06/23/23     cyanocobalamin  1000 MCG tablet Take 1,000 mcg by mouth daily.    [provider]  EPINEPHrine  0.3 mg/0.3 mL IJ SOAJ injection Inject 0.3 mg into the muscle as needed  for anaphylaxis. Patient not taking: Reported on 06/10/2023 03/15/20   Almira Jaeger, MD  estradiol  (CLIMARA  - DOSED IN MG/24 HR) 0.0375 mg/24hr patch Place 1 patch onto the skin once a week 06/16/22     estradiol  (CLIMARA  - DOSED IN MG/24 HR) 0.0375 mg/24hr patch Place 1 patch (0.0375 mg total) onto the skin once a week. 06/22/23     FLUoxetine  (PROZAC ) 40 MG capsule Take 1 capsule (40 mg total) by mouth daily. 06/16/22     FLUoxetine  (PROZAC ) 40 MG capsule Take 1 capsule (40 mg total) by mouth daily. 06/22/23     HYDROcodone -acetaminophen  (NORCO) 10-325 MG tablet TAKE (1) TABLET EVERY FOUR TO SIX HOURS AS NEEDED. Do not drive for 6 hours after use. 05/12/23   Almira Jaeger, MD  isosorbide  mononitrate (IMDUR ) 60 MG 24 hr tablet Take 1 tablet (60 mg total) by mouth daily. 06/16/22     isosorbide  mononitrate (IMDUR ) 60 MG 24 hr tablet  Take 1 tablet (60 mg total) by mouth daily. 06/22/23     ondansetron  (ZOFRAN -ODT) 4 MG disintegrating tablet Take 1 tablet (4 mg total) by mouth every 8 (eight) hours as needed for nausea or vomiting. 11/09/22   Sheikh, Omair Latif, DO  potassium chloride  SA (KLOR-CON  M) 20 MEQ tablet Take 1 tablet (20 mEq total) by mouth daily. 03/21/23     potassium chloride  SA (KLOR-CON  M) 20 MEQ tablet Take 1 tablet (20 mEq total) by mouth 2 (two) times daily. 06/23/23     Rimegepant Sulfate  (NURTEC) 75 MG TBDP Take 75 mg by mouth daily as needed. 05/12/23   Almira Jaeger, MD  rosuvastatin  (CRESTOR ) 10 MG tablet Take 1 tablet (10 mg total) by mouth once daily 06/16/22     rosuvastatin  (CRESTOR ) 10 MG tablet Alternate 1 tablet (10 mg) by mouth and 2 tablets (20 mg) by mouth every other night. 06/23/23     triamterene -hydrochlorothiazide  (MAXZIDE -25) 37.5-25 MG tablet Take 1 tablet by mouth once daily 06/16/22     triamterene -hydrochlorothiazide  (MAXZIDE -25) 37.5-25 MG tablet Take 1 tablet by mouth daily. 06/22/23        Family History  Problem Relation Age of Onset   Stroke Mother     Colon cancer Mother 18       dec with colon ca   Heart attack Mother    Migraines Mother    Stroke Father    Heart attack Father    Gallbladder disease Father 43       septic gallbladder   Rheum arthritis Brother        dec age 61 ?Rheumatoid arthritis   Diabetes Maternal Grandmother    Esophageal cancer Neg Hx    Stomach cancer Neg Hx    Rectal cancer Neg Hx     Social History   Socioeconomic History   Marital status: Married    Spouse name: Not on file   Number of children: Not on file   Years of education: Not on file   Highest education level: Not on file  Occupational History   Not on file  Tobacco Use   Smoking status: Former    Current packs/day: 0.00    Average packs/day: 0.5 packs/day for 18.0 years (9.0 ttl pk-yrs)    Types: Cigarettes    Start date: 10/11/1970    Quit date: 10/10/1988    Years since quitting: 34.7   Smokeless tobacco: Never  Substance and Sexual Activity   Alcohol use: Yes    Alcohol/week: 2.0 standard drinks of alcohol    Types: 2 Standard drinks or equivalent per week    Comment: occ glass of wine   Drug use: No   Sexual activity: Yes    Partners: Male    Birth control/protection: Surgical    Comment: TAH  Other Topics Concern   Not on file  Social History Narrative   Married 28 years   3 adopted children   Social Drivers of Corporate investment banker Strain: Not on file  Food Insecurity: No Food Insecurity (11/10/2022)   Hunger Vital Sign    Worried About Running Out of Food in the Last Year: Never true    Ran Out of Food in the Last Year: Never true  Transportation Needs: No Transportation Needs (11/10/2022)   PRAPARE - Administrator, Civil Service (Medical): No    Lack of Transportation (Non-Medical): No  Physical Activity: Not on file  Stress: Not on file  Social  Connections: Not on file   Review of Systems: A 12 point ROS discussed and pertinent positives are indicated in the HPI above.  All other systems  are negative.  Review of Systems  Vital Signs: LMP 03/31/1977    Physical Exam Constitutional:      General: She is not in acute distress.    Appearance: Normal appearance. She is normal weight.  HENT:     Head: Normocephalic and atraumatic.  Eyes:     General: No scleral icterus. Cardiovascular:     Rate and Rhythm: Normal rate.  Pulmonary:     Effort: Pulmonary effort is normal.  Abdominal:     General: There is no distension.     Palpations: Abdomen is soft.     Tenderness: There is no abdominal tenderness.  Musculoskeletal:       Legs:     Comments: Extensive superficial venous varicosities as diagramed   Skin:    General: Skin is warm and dry.     Findings: No bruising, erythema or rash.  Neurological:     Mental Status: She is alert and oriented to person, place, and time.  Psychiatric:        Mood and Affect: Mood normal.        Behavior: Behavior normal.       Imaging: US  RAD EVAL AND MGMT Result Date: 07/17/2023 EXAM: ESTABLISHED PATIENT OFFICE VISIT CHIEF COMPLAINT: SEE NOTE IN EPIC HISTORY OF PRESENT ILLNESS: SEE NOTE IN EPIC REVIEW OF SYSTEMS: SEE NOTE IN EPIC PHYSICAL EXAMINATION: SEE NOTE IN EPIC ASSESSMENT AND PLAN: SEE NOTE IN EPIC Electronically Signed   By: Fernando Hoyer M.D.   On: 07/17/2023 12:11    Labs:  CBC: Recent Labs    11/08/22 0538 11/09/22 0525 11/18/22 1425 05/12/23 1101  WBC 5.6 5.6 6.6 6.5  HGB 12.7 11.7* 14.0 14.8  HCT 39.0 35.8* 42.5 44.4  PLT 344 348 493.0* 379.0    COAGS: Recent Labs    11/08/22 0538  INR 1.1    BMP: Recent Labs    11/07/22 1148 11/08/22 0538 11/09/22 0525 11/18/22 1425 05/12/23 1101  NA 135 138 139 133* 138  Welch 3.4* 3.9 4.5 3.3* 2.9*  CL 97* 106 107 97 101  CO2 24 21* 22 24 27   GLUCOSE 109* 97 105* 96 98  BUN 15 11 13 16 18   CALCIUM  9.4 8.4* 8.6* 9.4 9.3  CREATININE 1.18* 1.05* 0.96 1.28* 1.00  GFRNONAA 48* 56* >60  --   --     LIVER FUNCTION TESTS: Recent Labs     11/08/22 0538 11/09/22 0525 11/18/22 1425 05/12/23 1101  BILITOT 1.1 0.4 0.5 0.5  AST 91* 39 29 19  ALT 96* 64* 21 13  ALKPHOS 285* 209* 129* 47  PROT 6.3* 6.1* 7.7 7.6  ALBUMIN 2.9* 2.7* 4.0 4.1    TUMOR MARKERS: No results for input(s): "AFPTM", "CEA", "CA199", "CHROMGRNA" in the last 8760 hours.  Assessment and Plan:  Extremely pleasant 78 year old female with a long history venous insufficiency.  I have treated her multiple times in the past and she has done well over the last 8 years.  Unfortunately, she has now developed recurrent symptoms.  Clinical evaluation and duplex ultrasound confirms insufficiency of the left anterior accessory great saphenous vein.  Additionally, she has a new extensive network of superficial venous varicosities along the right medial calf, anterior and medial aspect of the knee and the anterior aspect of the thigh on the right.  Her symptoms are more severe on the right than the left.  She is a candidate for percutaneous sclerotherapy of the new network of superficial varicosities along the right lower extremity.  It may take several sessions in order to treat all of these vessels thoroughly.  On the left, the incompetent anterior accessory great saphenous vein would be amenable to EVLT.  I would recommend this treatment as well.  She may also need saw at adjunctive sclerotherapy for any residual superficial varicosities.  1.)  Continue use of compression hose.  2.)  Please schedule for multivessel sclerotherapy of superficial venous varicosities along the right lower extremity.  3.)  please also request preauthorization for EVLT of the incompetent left anterior accessory great saphenous vein with adjunct sclerotherapy if necessary.  We will determine when to schedule this procedure depending on how the right-sided treatment goes.      Electronically Signed: Roxie Cord 07/17/2023, 1:21 PM   I spent a total of  25 Minutes in face to face in  clinical consultation, greater than 50% of which was counseling/coordinating care for superficial venous insufficiency.

## 2023-07-21 ENCOUNTER — Other Ambulatory Visit (INDEPENDENT_AMBULATORY_CARE_PROVIDER_SITE_OTHER)

## 2023-07-21 ENCOUNTER — Encounter: Payer: Self-pay | Admitting: Family Medicine

## 2023-07-21 DIAGNOSIS — E875 Hyperkalemia: Secondary | ICD-10-CM

## 2023-07-21 LAB — BASIC METABOLIC PANEL WITH GFR
BUN: 17 mg/dL (ref 6–23)
CO2: 27 meq/L (ref 19–32)
Calcium: 9.7 mg/dL (ref 8.4–10.5)
Chloride: 103 meq/L (ref 96–112)
Creatinine, Ser: 1.07 mg/dL (ref 0.40–1.20)
GFR: 50.82 mL/min — ABNORMAL LOW (ref >60.00)
Glucose, Bld: 93 mg/dL (ref 70–99)
Potassium: 3.1 meq/L — ABNORMAL LOW (ref 3.5–5.1)
Sodium: 139 meq/L (ref 135–145)

## 2023-07-22 ENCOUNTER — Other Ambulatory Visit: Payer: Self-pay

## 2023-07-22 DIAGNOSIS — E876 Hypokalemia: Secondary | ICD-10-CM

## 2023-07-24 ENCOUNTER — Other Ambulatory Visit (HOSPITAL_COMMUNITY): Payer: Self-pay | Admitting: Interventional Radiology

## 2023-07-24 DIAGNOSIS — I83813 Varicose veins of bilateral lower extremities with pain: Secondary | ICD-10-CM

## 2023-08-12 ENCOUNTER — Other Ambulatory Visit (HOSPITAL_BASED_OUTPATIENT_CLINIC_OR_DEPARTMENT_OTHER): Payer: Self-pay

## 2023-08-12 MED ORDER — SPIRONOLACTONE 25 MG PO TABS
25.0000 mg | ORAL_TABLET | Freq: Every day | ORAL | 11 refills | Status: DC
Start: 2023-08-12 — End: 2023-08-26
  Filled 2023-08-12: qty 30, 30d supply, fill #0

## 2023-08-14 ENCOUNTER — Ambulatory Visit
Admission: RE | Admit: 2023-08-14 | Discharge: 2023-08-14 | Disposition: A | Discharge status: Home / Self Care | Source: Ambulatory Visit | Attending: Interventional Radiology | Admitting: Interventional Radiology

## 2023-08-14 DIAGNOSIS — I83813 Varicose veins of bilateral lower extremities with pain: Secondary | ICD-10-CM

## 2023-08-14 DIAGNOSIS — I872 Venous insufficiency (chronic) (peripheral): Secondary | ICD-10-CM | POA: Diagnosis not present

## 2023-08-20 ENCOUNTER — Ambulatory Visit: Payer: Self-pay | Admitting: Family Medicine

## 2023-08-20 ENCOUNTER — Other Ambulatory Visit (INDEPENDENT_AMBULATORY_CARE_PROVIDER_SITE_OTHER)

## 2023-08-20 DIAGNOSIS — Z79899 Other long term (current) drug therapy: Secondary | ICD-10-CM | POA: Diagnosis not present

## 2023-08-20 DIAGNOSIS — E876 Hypokalemia: Secondary | ICD-10-CM

## 2023-08-20 LAB — BASIC METABOLIC PANEL WITH GFR
BUN: 23 mg/dL (ref 6–23)
CO2: 26 meq/L (ref 19–32)
Calcium: 9.2 mg/dL (ref 8.4–10.5)
Chloride: 102 meq/L (ref 96–112)
Creatinine, Ser: 1.19 mg/dL (ref 0.40–1.20)
GFR: 44.71 mL/min — ABNORMAL LOW (ref >60.00)
Glucose, Bld: 99 mg/dL (ref 70–99)
Potassium: 3.7 meq/L (ref 3.5–5.1)
Sodium: 136 meq/L (ref 135–145)

## 2023-08-23 LAB — DRUG MONITORING, PANEL 8 WITH CONFIRMATION, URINE
6 Acetylmorphine: NEGATIVE ng/mL (ref <10)
Alcohol Metabolites: POSITIVE ng/mL — AB (ref <500)
Alphahydroxyalprazolam: 25 ng/mL — ABNORMAL HIGH (ref <25)
Alphahydroxymidazolam: NEGATIVE ng/mL (ref <50)
Alphahydroxytriazolam: NEGATIVE ng/mL (ref <50)
Aminoclonazepam: NEGATIVE ng/mL (ref <25)
Amphetamines: NEGATIVE ng/mL (ref <500)
Benzodiazepines: POSITIVE ng/mL — AB (ref <100)
Buprenorphine, Urine: NEGATIVE ng/mL (ref <5)
Cocaine Metabolite: NEGATIVE ng/mL (ref <150)
Creatinine: 124.2 mg/dL (ref >=20.0)
Ethyl Glucuronide (ETG): 33565 ng/mL — ABNORMAL HIGH (ref <500)
Ethyl Sulfate (ETS): 8544 ng/mL — ABNORMAL HIGH (ref <100)
Hydroxyethylflurazepam: NEGATIVE ng/mL (ref <50)
Lorazepam: NEGATIVE ng/mL (ref <50)
MDMA: NEGATIVE ng/mL (ref <500)
Marijuana Metabolite: NEGATIVE ng/mL (ref <20)
Nordiazepam: NEGATIVE ng/mL (ref <50)
Opiates: NEGATIVE ng/mL (ref <100)
Oxazepam: NEGATIVE ng/mL (ref <50)
Oxidant: NEGATIVE ug/mL (ref <200)
Oxycodone: NEGATIVE ng/mL (ref <100)
Temazepam: NEGATIVE ng/mL (ref <50)
pH: 6.3 (ref 4.5–9.0)

## 2023-08-23 LAB — DM TEMPLATE

## 2023-08-26 ENCOUNTER — Other Ambulatory Visit (HOSPITAL_BASED_OUTPATIENT_CLINIC_OR_DEPARTMENT_OTHER): Payer: Self-pay

## 2023-08-26 MED ORDER — TRIAMTERENE-HCTZ 37.5-25 MG PO TABS
1.0000 | ORAL_TABLET | Freq: Every day | ORAL | 3 refills | Status: AC
  Filled 2023-10-16: qty 90, 90d supply, fill #0
  Filled 2024-01-09: qty 90, 90d supply, fill #1
  Filled 2024-03-18 – 2024-03-22 (×4): qty 90, 90d supply, fill #2

## 2023-08-26 MED ORDER — POTASSIUM CHLORIDE CRYS ER 20 MEQ PO TBCR
EXTENDED_RELEASE_TABLET | ORAL | 3 refills | Status: AC
  Filled 2023-09-16: qty 270, 90d supply, fill #0
  Filled 2023-12-16: qty 270, 90d supply, fill #1
  Filled 2024-03-04: qty 270, 90d supply, fill #2

## 2023-08-27 ENCOUNTER — Other Ambulatory Visit: Payer: Self-pay | Admitting: Interventional Radiology

## 2023-08-27 DIAGNOSIS — I83813 Varicose veins of bilateral lower extremities with pain: Secondary | ICD-10-CM

## 2023-09-03 ENCOUNTER — Inpatient Hospital Stay: Admission: RE | Admit: 2023-09-03 | Source: Ambulatory Visit

## 2023-09-03 ENCOUNTER — Ambulatory Visit
Admission: RE | Admit: 2023-09-03 | Discharge: 2023-09-03 | Disposition: A | Discharge status: Home / Self Care | Source: Ambulatory Visit | Attending: Interventional Radiology | Admitting: Interventional Radiology

## 2023-09-03 ENCOUNTER — Other Ambulatory Visit: Payer: Self-pay | Admitting: Interventional Radiology

## 2023-09-03 DIAGNOSIS — I83813 Varicose veins of bilateral lower extremities with pain: Secondary | ICD-10-CM

## 2023-09-03 HISTORY — PX: IR RADIOLOGIST EVAL & MGMT: IMG5224

## 2023-09-16 ENCOUNTER — Other Ambulatory Visit (HOSPITAL_BASED_OUTPATIENT_CLINIC_OR_DEPARTMENT_OTHER): Payer: Self-pay

## 2023-09-17 ENCOUNTER — Other Ambulatory Visit (HOSPITAL_BASED_OUTPATIENT_CLINIC_OR_DEPARTMENT_OTHER): Payer: Self-pay

## 2023-09-21 DIAGNOSIS — K080 Exfoliation of teeth due to systemic causes: Secondary | ICD-10-CM | POA: Diagnosis not present

## 2023-09-24 ENCOUNTER — Ambulatory Visit: Admitting: Dermatology

## 2023-10-15 ENCOUNTER — Encounter: Payer: Self-pay | Admitting: Pulmonary Disease

## 2023-10-15 ENCOUNTER — Other Ambulatory Visit (HOSPITAL_BASED_OUTPATIENT_CLINIC_OR_DEPARTMENT_OTHER): Payer: Self-pay

## 2023-10-15 MED ORDER — AZITHROMYCIN 250 MG PO TABS
ORAL_TABLET | ORAL | 0 refills | Status: AC
  Filled 2023-10-15: qty 6, 5d supply, fill #0

## 2023-10-15 NOTE — Telephone Encounter (Signed)
 Zpak sent

## 2023-10-16 ENCOUNTER — Other Ambulatory Visit (HOSPITAL_BASED_OUTPATIENT_CLINIC_OR_DEPARTMENT_OTHER): Payer: Self-pay

## 2023-10-16 NOTE — Telephone Encounter (Signed)
 noted

## 2023-10-27 ENCOUNTER — Ambulatory Visit: Admitting: Dermatology

## 2023-10-27 ENCOUNTER — Other Ambulatory Visit: Payer: Self-pay | Admitting: Family Medicine

## 2023-10-27 DIAGNOSIS — L814 Other melanin hyperpigmentation: Secondary | ICD-10-CM | POA: Diagnosis not present

## 2023-10-27 DIAGNOSIS — L608 Other nail disorders: Secondary | ICD-10-CM

## 2023-10-27 DIAGNOSIS — L918 Other hypertrophic disorders of the skin: Secondary | ICD-10-CM

## 2023-10-27 DIAGNOSIS — L659 Nonscarring hair loss, unspecified: Secondary | ICD-10-CM

## 2023-10-27 DIAGNOSIS — D492 Neoplasm of unspecified behavior of bone, soft tissue, and skin: Secondary | ICD-10-CM

## 2023-10-27 DIAGNOSIS — Z1283 Encounter for screening for malignant neoplasm of skin: Secondary | ICD-10-CM

## 2023-10-27 DIAGNOSIS — L821 Other seborrheic keratosis: Secondary | ICD-10-CM | POA: Diagnosis not present

## 2023-10-27 DIAGNOSIS — L820 Inflamed seborrheic keratosis: Secondary | ICD-10-CM

## 2023-10-27 DIAGNOSIS — D1801 Hemangioma of skin and subcutaneous tissue: Secondary | ICD-10-CM

## 2023-10-27 DIAGNOSIS — Z1231 Encounter for screening mammogram for malignant neoplasm of breast: Secondary | ICD-10-CM

## 2023-10-27 DIAGNOSIS — W908XXA Exposure to other nonionizing radiation, initial encounter: Secondary | ICD-10-CM

## 2023-10-27 DIAGNOSIS — L943 Sclerodactyly: Secondary | ICD-10-CM

## 2023-10-27 NOTE — Patient Instructions (Addendum)
 [facility] Alonso Netter, MD, PhD [facility] [address] [place], [place] [address] Office: [phone]  For areas treated with Liquid Nitrogen:  Keep clean with soap and water.  Apply Vaseline or Aquaphor twice daily.   Important Information  Due to recent changes in healthcare laws, you may see results of your pathology and/or laboratory studies on MyChart before the doctors have had a chance to review them. We understand that in some cases there may be results that are confusing or concerning to you. Please understand that not all results are received at the same time and often the doctors may need to interpret multiple results in order to provide you with the best plan of care or course of treatment. Therefore, we ask that you please give us  2 business days to thoroughly review all your results before contacting the office for clarification. Should we see a critical lab result, you will be contacted sooner.   If You Need Anything After Your Visit  If you have any questions or concerns for your doctor, please call our main line at [phone] If no one answers, please leave a voicemail as directed and we will return your call as soon as possible. Messages left after 4 pm will be answered the following business day.   You may also send us  a message via MyChart. We typically respond to MyChart messages within 1-2 business days.  For prescription refills, please ask your pharmacy to contact our office. Our fax number is [phone].  If you have an urgent issue when the clinic is closed that cannot wait until the next business day, you can page your doctor at the number below.    Please note that while we do our best to be available for urgent issues outside of office hours, we are not available 24/7.   If you have an urgent issue and are unable to reach us , you may choose to seek medical care at your doctor's office, retail  clinic, urgent care center, or emergency room.  If you have a medical emergency, please immediately call 911 or go to the emergency department. In the event of inclement weather, please call our main line at [phone] for an update on the status of any delays or closures.  Dermatology Medication Tips: Please keep the boxes that topical medications come in in order to help keep track of the instructions about where and how to use these. Pharmacies typically print the medication instructions only on the boxes and not directly on the medication tubes.   If your medication is too expensive, please contact our office at [phone] or send us  a message through MyChart.   We are unable to tell what your co-pay for medications will be in advance as this is different depending on your insurance coverage. However, we may be able to find a substitute medication at lower cost or fill out paperwork to get insurance to cover a needed medication.   If a prior authorization is required to get your medication covered by your insurance company, please allow us  1-2 business days to complete this process.  Drug prices often vary depending on where the prescription is filled and some pharmacies may offer cheaper prices.  The website www.goodrx.com contains coupons for medications through different pharmacies. The prices here do not account for what the cost may be with help from insurance (it may be cheaper with your insurance), but the website can give you the price  if you did not use any insurance.  - You can print the associated coupon and take it with your prescription to the pharmacy.  - You may also stop by our office during regular business hours and pick up a GoodRx coupon card.  - If you need your prescription sent electronically to a different pharmacy, notify our office through [facility] or by phone at [phone]

## 2023-10-27 NOTE — Progress Notes (Unsigned)
 New Patient Visit   Subjective  Kari Welch is a 78 y.o. female who presents for the following: Skin Cancer Screening and Full Body Skin Exam  Patient is accompanied by her spouse, Danial, who is a neurologist.  The patient presents for Total-Body Skin Exam (TBSE) for skin cancer screening and mole check. The patient has spots, moles and lesions to be evaluated, some may be new or changing.  There are moles on her back that is irritating her. There are areas on her back and under her breasts that she is concerned about.  There is a lesion on her right nasal dorsum that has been there for about a year that has gotten progressively larger and becoming irritating.  Patient is also concerned about hair thinning.  Patient has dark areas on her face that she would like addressed.   Dr. Katrinka referred patient for a full body skin exam.  Patient has been seen by Patsy Schultze, MD previously, but has never had a full body skin exam.   Patient states that she spent a large amount of time when she was younger. She would break out in hives when outside about 25 years ago. Patient declines wearing sunscreen.   Previous history of small vessel vasculopathy.  The following portions of the chart were reviewed this encounter and updated as appropriate: medications, allergies, medical history  Review of Systems:  No other skin or systemic complaints except as noted in HPI or Assessment and Plan.  Objective  Well appearing patient in no apparent distress; mood and affect are within normal limits.  A full examination was performed including scalp, head, eyes, ears, nose, lips, neck, chest, axillae, abdomen, back, buttocks, bilateral upper extremities, bilateral lower extremities, hands, feet, fingers, toes, fingernails, and toenails. All findings within normal limits unless otherwise noted below.   Relevant physical exam findings are noted in the Assessment and Plan.  Right Lower Back Inflamed  stuck on brown papule Left Thigh - Anterior, Right Breast     Assessment & Plan   SKIN CANCER SCREENING PERFORMED TODAY.  ACTINIC DAMAGE - Chronic condition, secondary to cumulative UV/sun exposure - diffuse scaly erythematous macules with underlying dyspigmentation - Recommend daily broad spectrum sunscreen SPF 30+ to sun-exposed areas, reapply every 2 hours as needed.  - Staying in the shade or wearing long sleeves, sun glasses (UVA+UVB protection) and wide brim hats (4-inch brim around the entire circumference of the hat) are also recommended for sun protection.  - Call for new or changing lesions.  LENTIGINES, SEBORRHEIC KERATOSES, HEMANGIOMAS - Benign normal skin lesions - Benign-appearing - Call for any changes  MELANOCYTIC NEVI - Tan-brown and/or pink-flesh-colored symmetric macules and papules - Benign appearing on exam today - Observation - Call clinic for new or changing moles - Recommend daily use of broad spectrum spf 30+ sunscreen to sun-exposed areas.   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 Reassured that it would take 6-9 months before noticing an improvement. Advised to use the foam.  Discussed supplements such as biotin, neutrophil, and viviscal. Discussed that the next step would be oral minoxidil. Patient declined at this time.  Irregularly Shaped Brown Patches- Right Chest and  Left leg- likely flat SKs - Pictured and will watch for changes SKIN TAG   INFLAMED SEBORRHEIC KERATOSIS Right Lower Back Destruction of lesion - Right Lower Back Complexity: simple   Destruction method: cryotherapy    Informed consent: discussed and consent obtained   Timeout:  patient name, date of birth, surgical site, and procedure verified Lesion destroyed using liquid nitrogen: Yes   Region frozen until ice ball extended beyond lesion: Yes   Outcome: patient tolerated procedure well with no complications   Post-procedure details: sterile dressing applied    SEBORRHEIC KERATOSIS (2) Left Thigh - Anterior, Right Breast Benign. Monitor. PTERYGIUM OF NAIL Left 4th Fingernail LENTIGINES   CHERRY ANGIOMA   SCLERODACTYLY   HAIR LOSS   NEOPLASM OF SKIN    Return in about 1 year (around 10/26/2024) for FBSE.  LILLETTE Rollene Gobble, RN, am acting as scribe for RUFUS CHRISTELLA HOLY, MD .  We spent 60 min reviewing records, taking the patient history, providing face to face care with the patient, sending prescriptions.  Documentation: I have reviewed the above documentation for accuracy and completeness, and I agree with the above.  RUFUS CHRISTELLA HOLY, MD

## 2023-10-28 ENCOUNTER — Encounter: Payer: Self-pay | Admitting: Dermatology

## 2023-11-03 DIAGNOSIS — H90A22 Sensorineural hearing loss, unilateral, left ear, with restricted hearing on the contralateral side: Secondary | ICD-10-CM | POA: Diagnosis not present

## 2023-11-12 ENCOUNTER — Ambulatory Visit
Admission: RE | Admit: 2023-11-12 | Discharge: 2023-11-12 | Disposition: A | Discharge status: Home / Self Care | Source: Ambulatory Visit | Attending: Family Medicine | Admitting: Family Medicine

## 2023-11-12 DIAGNOSIS — Z1231 Encounter for screening mammogram for malignant neoplasm of breast: Secondary | ICD-10-CM | POA: Diagnosis not present

## 2023-11-15 ENCOUNTER — Encounter: Payer: Self-pay | Admitting: Pulmonary Disease

## 2023-11-16 ENCOUNTER — Other Ambulatory Visit (HOSPITAL_BASED_OUTPATIENT_CLINIC_OR_DEPARTMENT_OTHER): Payer: Self-pay

## 2023-11-16 NOTE — Telephone Encounter (Signed)
 Please advise on CT being needed sooner

## 2023-11-17 NOTE — Telephone Encounter (Signed)
 Pt decided to schedule on her on and asking about authorization for CT

## 2023-11-18 DIAGNOSIS — H90A32 Mixed conductive and sensorineural hearing loss, unilateral, left ear with restricted hearing on the contralateral side: Secondary | ICD-10-CM | POA: Diagnosis not present

## 2023-11-23 ENCOUNTER — Ambulatory Visit
Admission: RE | Admit: 2023-11-23 | Discharge: 2023-11-23 | Disposition: A | Discharge status: Home / Self Care | Source: Ambulatory Visit | Attending: Pulmonary Disease | Admitting: Pulmonary Disease

## 2023-11-23 DIAGNOSIS — J439 Emphysema, unspecified: Secondary | ICD-10-CM | POA: Diagnosis not present

## 2023-11-23 DIAGNOSIS — R918 Other nonspecific abnormal finding of lung field: Secondary | ICD-10-CM

## 2023-11-23 DIAGNOSIS — C50912 Malignant neoplasm of unspecified site of left female breast: Secondary | ICD-10-CM | POA: Diagnosis not present

## 2023-11-25 ENCOUNTER — Telehealth: Payer: Self-pay | Admitting: Pulmonary Disease

## 2023-11-25 ENCOUNTER — Encounter: Payer: Self-pay | Admitting: Pulmonary Disease

## 2023-11-25 DIAGNOSIS — R911 Solitary pulmonary nodule: Secondary | ICD-10-CM | POA: Insufficient documentation

## 2023-11-25 NOTE — Telephone Encounter (Signed)
 Reviewed CT scan.  Nodule in similar area as prior doubled in size.  There are morphological differences in the appearance.  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  Certainly an inflammatory infiltrate related to recent bronchitis cough etc. is possible.  It would be very coincidental to be in the same location.  I discussed with patient's husband in detail.  Discussed my recommendation for biopsy.  Also discussed possibility of waiting 3 to 4 weeks repeating scan per radiology recommendation.  We agreed to meet in clinic and discuss further in the coming days.  In the meantime we will work towards that of a biopsy as I presume this will be the route taken.  Margie, can you please double book patient at 11:30 AM on 9/5?  Please schedule the following:  Provider performing procedure:Dr. Byrum/Dgayli/Assaker Diagnosis: Lung nodule Which side if for nodule / mass?  Right Procedure: Navigational bronchoscopy Has patient been spoken to by Provider and given informed consent?  Pending clinic visit 9/5 Anesthesia: General Do you need Fluro?  Yes Duration of procedure: per provider  Date: September 8 - 19 Alternate Date: n/a  Time: n/a Location: [facility] endoscopy Does patient have OSA? no DM? no Or Latex allergy? no Medication Restriction/ Anticoagulate/Antiplatelet: baby aspirin  Pre-op Labs Ordered:determined by Anesthesia Imaging request: in PACS  (If, SuperDimension CT Chest, please have STAT courier sent to ENDO)

## 2023-11-26 NOTE — Telephone Encounter (Signed)
 Spoke to patient and scheduled appt for 12/04/2023 at 11:30.  PCC's, please schedule bronch. Thanks.

## 2023-11-27 ENCOUNTER — Encounter: Payer: Self-pay | Admitting: Pulmonary Disease

## 2023-11-27 NOTE — Telephone Encounter (Signed)
 Letter created. Patient is aware is scheduled on 12/07/23. Routing to [employer] for [employer].

## 2023-11-27 NOTE — Telephone Encounter (Signed)
 Please get her scheduled in endo, thanks.

## 2023-11-27 NOTE — Telephone Encounter (Signed)
 We need to know how long the patient should be without baby aspirin . Thanks!

## 2023-11-27 NOTE — Telephone Encounter (Signed)
 Copied from CRM [id number]. Topic: Clinical - Medical Advice >> Nov 27, 2023 11:36 AM Celestine FALCON wrote: Reason for CRM: Dr. Danial Forward is calling to ask if the pt should stop using the Flonase medication before her September 8th bronchoscopy with Dr. Candance so it doesn't goof up the interpretation/results of the procedure with that medication being a steroid. His best phone number is [phone] ok to leave a vm.  Can you verify this information Dr. Annella

## 2023-11-27 NOTE — Telephone Encounter (Signed)
 2 days

## 2023-12-04 ENCOUNTER — Other Ambulatory Visit: Payer: Self-pay

## 2023-12-04 ENCOUNTER — Ambulatory Visit (INDEPENDENT_AMBULATORY_CARE_PROVIDER_SITE_OTHER): Admitting: Pulmonary Disease

## 2023-12-04 ENCOUNTER — Encounter: Payer: Self-pay | Admitting: Pulmonary Disease

## 2023-12-04 ENCOUNTER — Encounter (HOSPITAL_COMMUNITY): Payer: Self-pay | Admitting: Emergency Medicine

## 2023-12-04 ENCOUNTER — Telehealth: Payer: Self-pay

## 2023-12-04 VITALS — BP 114/66 | HR 62 | Ht 60.5 in | Wt 133.4 lb

## 2023-12-04 DIAGNOSIS — Z87891 Personal history of nicotine dependence: Secondary | ICD-10-CM

## 2023-12-04 DIAGNOSIS — R053 Chronic cough: Secondary | ICD-10-CM | POA: Diagnosis not present

## 2023-12-04 DIAGNOSIS — R911 Solitary pulmonary nodule: Secondary | ICD-10-CM | POA: Diagnosis not present

## 2023-12-04 DIAGNOSIS — R0609 Other forms of dyspnea: Secondary | ICD-10-CM

## 2023-12-04 NOTE — Anesthesia Preprocedure Evaluation (Addendum)
 Anesthesia Evaluation  Patient identified by MRN, date of birth, ID band Patient awake    Reviewed: Allergy & Precautions, NPO status , Patient's Chart, lab work & pertinent test results, reviewed documented beta blocker date and time   History of Anesthesia Complications Negative for: history of anesthetic complications  Airway Mallampati: II  TM Distance: >3 FB     Dental no notable dental hx.    Pulmonary neg shortness of breath, asthma , pneumonia, COPD, neg recent URI, former smoker   breath sounds clear to auscultation       Cardiovascular hypertension, (-) angina + Peripheral Vascular Disease  (-) CAD, (-) Past MI and (-) Cardiac Stents  Rhythm:Regular Rate:Normal     Neuro/Psych  Headaches, neg Seizures PSYCHIATRIC DISORDERS         GI/Hepatic ,,,(+) neg Cirrhosis        Endo/Other    Renal/GU CRFRenal disease     Musculoskeletal  (+) Arthritis , Osteoarthritis,    Abdominal   Peds  Hematology   Anesthesia Other Findings   Reproductive/Obstetrics                              Anesthesia Physical Anesthesia Plan  ASA: 3  Anesthesia Plan: General   Post-op Pain Management:    Induction: Intravenous  PONV Risk Score and Plan: 2 and Ondansetron  and Dexamethasone   Airway Management Planned: Oral ETT  Additional Equipment:   Intra-op Plan:   Post-operative Plan: Extubation in OR  Informed Consent: I have reviewed the patients History and Physical, chart, labs and discussed the procedure including the risks, benefits and alternatives for the proposed anesthesia with the patient or authorized representative who has indicated his/her understanding and acceptance.     Dental advisory given  Plan Discussed with: CRNA  Anesthesia Plan Comments: (PAT note written 12/04/2023 by Allison Zelenak, PA-C.  )         Anesthesia Quick Evaluation

## 2023-12-04 NOTE — Progress Notes (Addendum)
 PCP - Garnette Lukes, MD  Chest x-ray - DOS EKG - DOS  Blood Thinner Instructions: Spoke with Dr. Shelah who said to stop Pletal  as of today (12/04/23). Called pt back and she had not taken today's (Friday) Aspirin  Instructions: Stop 2 days prior  Anesthesia review: Y  Patient verbally denies any shortness of breath, fever, cough and chest pain during phone call   -------------  SDW INSTRUCTIONS given:  Your procedure is scheduled on Monday, Sept 8th.  Report to [facility] Main Entrance A at 0530 A.M., and check in at the Admitting office.  Call this number if you have problems the morning of surgery:  [phone]   Remember:  Do not eat or drink after midnight the night before your surgery    Take these medicines the morning of surgery with A SIP OF WATER  amLODipine  (NORVASC )  SYMBICORT   FLUoxetine  (PROZAC )  isosorbide  mononitrate (IMDUR )  rosuvastatin  (CRESTOR )  acetaminophen  (TYLENOL )-if needed albuterol  (PROAIR  HFA)-if needed (Please bring on the day of surgery) ALPRAZolam  (XANAX )-if needed Rimegepant Sulfate  (NURTEC)-if needed  As of today, STOP taking any Aspirin  (unless otherwise instructed by your surgeon) Aleve, Naproxen, Ibuprofen, Motrin, Advil, Goody's, BC's, all herbal medications, fish oil, and all vitamins.                      Do not wear jewelry, make up, or nail polish            Do not wear lotions, powders, perfumes/colognes, or deodorant.            Do not shave 48 hours prior to surgery.  Men may shave face and neck.            Do not bring valuables to the hospital.            [facility] is not responsible for any belongings or valuables.  Do NOT Smoke (Tobacco/Vaping) 24 hours prior to your procedure If you use a CPAP at night, you may bring all equipment for your overnight stay.   Contacts, glasses, dentures or bridgework may not be worn into surgery.      For patients admitted to the hospital, discharge time will be determined by your  treatment team.   Patients discharged the day of surgery will not be allowed to drive home, and someone needs to stay with them for 24 hours.    Special instructions:   [place]- Preparing For Surgery  Before surgery, you can play an important role. Because skin is not sterile, your skin needs to be as free of germs as possible. You can reduce the number of germs on your skin by washing with CHG (chlorahexidine gluconate) Soap before surgery.  CHG is an antiseptic cleaner which kills germs and bonds with the skin to continue killing germs even after washing.    Oral Hygiene is also important to reduce your risk of infection.  Remember - BRUSH YOUR TEETH THE MORNING OF SURGERY WITH YOUR REGULAR TOOTHPASTE  Please do not use if you have an allergy to CHG or antibacterial soaps. If your skin becomes reddened/irritated stop using the CHG.  Do not shave (including legs and underarms) for at least 48 hours prior to first CHG shower. It is OK to shave your face.  Please follow these instructions carefully.   Shower the NIGHT BEFORE SURGERY and the MORNING OF SURGERY with DIAL Soap.   Pat yourself dry with a CLEAN TOWEL.  Wear CLEAN PAJAMAS to  bed the night before surgery  Place CLEAN SHEETS on your bed the night of your first shower and DO NOT SLEEP WITH PETS.   Day of Surgery: Please shower morning of surgery  Wear Clean/Comfortable clothing the morning of surgery Do not apply any deodorants/lotions.   Remember to brush your teeth WITH YOUR REGULAR TOOTHPASTE.   Questions were answered. Patient verbalized understanding of instructions.

## 2023-12-04 NOTE — H&P (View-Only) (Signed)
 @Patient  ID: Kari Welch, female    DOB: 25-Aug-1945, 78 y.o.   MRN: [id number]  Chief Complaint  Patient presents with   Follow-up    CT F/u Cough worse in the mornings, clear/yellow sputum coughed up. Pt states Zpack helped somewhat for a week or two.     Referring provider: Katrinka Garnette KIDD, MD  HPI:   78 y.o. woman whom we are seeing for evaluation of chronic cough.  Most recent cardiology note reviewed.  With recent GI note reviewed.  Coughing worsened June 2025.  Sound like an of the viral illness.  Prolonged bronchitis symptoms now for months.  Now dry cough.  Z-Pak improving for few days then back to prior levels.  May be gradually improved over time.  But certainly worse than baseline.  They note this happens a couple times a year but many weeks to months of increased cough and bronchitis symptoms.  She is not using her Symbicort  regularly.  We stressed the importance of regular Symbicort  use for maintenance inhaler for what felt to be cough variant asthma.  Cough is productive.  We discussed escalating therapies in the future but until we know how well she responds to ICS/LABA therapy it is hard to make additional decisions.  We discussed at length her recent CT scan.  The nodule that appears enlarged and changed in my eye.  We discussed risk and benefit of biopsies.  Next steps if malignant.  Additional imaging and PFTs if surgery needed.  HPI initial visit: Patient complains of cough.  Present for months.  At least 6 months if not longer.  Typically worse in the mornings.  Hacking cough.  Sometimes brings up phlegm.  Within the also has coughing flares throughout the day.  Seems to be some variability in terms of severity and frequency day-to-day.  No position to make things better or worse.  No seasonal or environmental factors she can identify to make things better or worse.  No really alleviating or exacerbating factors.  Recently prescribed Symbicort .   She had chest  imaging 05/2022, chest x-ray that on my review and interpretation reveals clear lungs with mild hyperinflation.  More recently, she had a CT chest that on my review interpretation shows fairly significant emphysematous changes with a predilection for the upper lobes, otherwise clear lungs, scattered nodules.   Questionaires / Pulmonary Flowsheets:   ACT:      No data to display          MMRC:     No data to display          Epworth:      No data to display          Tests:   FENO:  No results found for: NITRICOXIDE  PFT:     No data to display          WALK:      No data to display          Imaging: Personally reviewed and as per EMR CT Super D Chest Wo Contrast Addendum Date: 11/27/2023 ADDENDUM REPORT: 11/27/2023 09:17 ADDENDUM: Patient does not have a history of breast cancer, as dictated in the original report. Electronically Signed   By: Newell Eke M.D.   On: 11/27/2023 09:17   Result Date: 11/27/2023 CLINICAL DATA:  Lung nodules, emphysema.  Left breast cancer. EXAM: CT CHEST WITHOUT CONTRAST TECHNIQUE: Multidetector CT imaging of the chest was performed using thin slice collimation for electromagnetic bronchoscopy  planning purposes, without intravenous contrast. RADIATION DOSE REDUCTION: This exam was performed according to the departmental dose-optimization program which includes automated exposure control, adjustment of the mA and/or kV according to patient size and/or use of iterative reconstruction technique. COMPARISON:  12/15/2022 and 07/08/2022. FINDINGS: Cardiovascular: Atherosclerotic calcification of the aorta, aortic valve and coronary arteries. Enlarged pulmonic trunk and heart. No pericardial effusion. Mediastinum/Nodes: No pathologically enlarged mediastinal or axillary lymph nodes. Hilar regions are difficult to definitively evaluate without IV contrast. Esophagus is grossly unremarkable. Lungs/Pleura: Centrilobular and paraseptal  emphysema. New nodular consolidation in the medial right lower lobe measures 9 x 12 mm (5/102). Minimal dependent atelectasis bilaterally. Previously question 3 mm lingular nodule is not visualized. Chronic mild volume loss in the lingula. No pleural fluid. Airway is unremarkable. Upper Abdomen: Cholecystectomy. Visualized portions of the liver, adrenal glands, kidneys, spleen, pancreas, stomach and bowel are otherwise grossly unremarkable. No upper abdominal adenopathy. Musculoskeletal: None. IMPRESSION: 1. New nodular consolidation in the medial right lower lobe may be infectious/inflammatory in etiology. Recommend follow-up CT chest without contrast in 3-4 weeks to ensure resolution. Malignancy cannot be excluded. 2. Aortic atherosclerosis (ICD10-I70.0). Coronary artery calcification. 3. Enlarged pulmonic trunk, indicative of pulmonary arterial hypertension. 4.  Emphysema (ICD10-J43.9). Electronically Signed: By: Newell Eke M.D. On: 11/25/2023 11:41   MM 3D SCREENING MAMMOGRAM BILATERAL BREAST Result Date: 11/14/2023 CLINICAL DATA:  Screening. EXAM: MM DIGITAL SCREENING BILAT W/ TOMO AND CAD TECHNIQUE: Bilateral screening digital craniocaudal and mediolateral oblique mammograms were obtained. Bilateral screening digital breast tomosynthesis was performed. The images were evaluated with computer-aided detection. COMPARISON:  Previous exam(s). ACR Breast Density Category c: The breasts are heterogeneously dense, which may obscure small masses. FINDINGS: There are no findings suspicious for malignancy. IMPRESSION: No mammographic evidence of malignancy. A result letter of this screening mammogram will be mailed directly to the patient. RECOMMENDATION: Screening mammogram in one year. (Code:[id number]-B-01Y) BI-RADS CATEGORY  1: Negative. Electronically Signed   By: Rosina Gelineau M.D.   On: 11/14/2023 11:02    Lab Results: Personally reviewed CBC    Component Value Date/Time   WBC 6.5 05/12/2023 1101   RBC  4.20 05/12/2023 1101   HGB 14.8 05/12/2023 1101   HCT 44.4 05/12/2023 1101   PLT 379.0 05/12/2023 1101   MCV 105.6 (H) 05/12/2023 1101   MCH 34.9 (H) 11/09/2022 0525   MCHC 33.3 05/12/2023 1101   RDW 13.6 05/12/2023 1101   LYMPHSABS 1.5 05/12/2023 1101   MONOABS 0.6 05/12/2023 1101   EOSABS 0.1 05/12/2023 1101   BASOSABS 0.1 05/12/2023 1101    BMET    Component Value Date/Time   NA 136 08/20/2023 1103   K 3.7 08/20/2023 1103   CL 102 08/20/2023 1103   CO2 26 08/20/2023 1103   GLUCOSE 99 08/20/2023 1103   GLUCOSE 83 01/27/2006 1158   BUN 23 08/20/2023 1103   CREATININE 1.19 08/20/2023 1103   CREATININE 1.05 (H) 03/15/2020 1155   CALCIUM  9.2 08/20/2023 1103   GFRNONAA >60 11/09/2022 0525   GFRNONAA 53 (L) 03/15/2020 1155   GFRAA 61 03/15/2020 1155    BNP No results found for: BNP  ProBNP    Component Value Date/Time   PROBNP 34.0 07/16/2022 1000    Specialty Problems       Pulmonary Problems   Emphysema lung (HCC)   Noted on CT April 2024      Lung nodule    Allergies  Allergen Reactions   Methylprednisolone  Other (See Comments)  With IM injection- Panic attacks, trouble with concentration, hyperactive, hyperventilation   Prednisone  Swelling    short course=swelling    Immunization History  Administered Date(s) Administered   DTaP 12/25/2009   Fluad  Quad(high Dose 65+) 12/21/2018, 02/14/2020, 01/08/2021, 01/15/2022   Fluad  Trivalent(High Dose 65+) 12/19/2022   INFLUENZA, HIGH DOSE SEASONAL PF 02/26/2015, 04/24/2016, 01/20/2017, 03/10/2018   Influenza Split 01/17/2011   Influenza,inj,Quad PF,6+ Mos 01/07/2013, 01/13/2014   PFIZER Comirnaty (Gray Top)Covid-19 Tri-Sucrose Vaccine 01/15/2022   PFIZER(Purple Top)SARS-COV-2 Vaccination 05/06/2019, 05/27/2019, 12/06/2019   Pfizer Covid-19 Vaccine Bivalent Booster 51yrs & up 01/28/2021   Pfizer(Comirnaty )Fall Seasonal Vaccine 12 years and older 01/15/2022, 12/19/2022   Pneumococcal Conjugate-13  04/19/2013   Pneumococcal Polysaccharide-23 05/02/2015   Tdap 03/31/2008   Zoster Recombinant(Shingrix) 10/03/2020, 03/01/2021    Past Medical History:  Diagnosis Date   Abnormal Pap smear    h/o   Allergy    seasonal   Arthritis    Collagen vascular disease (HCC)    Elevated LFTs    Work up negative - Dr. Obie   Hyperlipidemia    Hyperplastic colon polyp    Hypertension    Internal hemorrhoids    INTERNAL HEMORRHOIDS 01/24/2009   Qualifier: Diagnosis of  By: Nelson-Smith CMA (AAMA), Dottie     Migraine    Prothrombin gene mutation (HCC)    heterozygous   PVD (peripheral vascular disease) (HCC)    takes Pletal  to open blood flow of blood to fingers, toes, and lower extremities   Raynaud's disease    Vasculitis (HCC)    Venous insufficiency    superficial left leg- seeing a vein specialist in August    Tobacco History: Social History   Tobacco Use  Smoking Status Former   Current packs/day: 0.00   Average packs/day: 0.5 packs/day for 18.0 years (9.0 ttl pk-yrs)   Types: Cigarettes   Start date: 10/11/1970   Quit date: 10/10/1988   Years since quitting: 35.1  Smokeless Tobacco Never  Tobacco Comments   Stopped smoking 2008.   Counseling given: Not Answered Tobacco comments: Stopped smoking 2008.   Continue to not smoke  Outpatient Encounter Medications as of 12/04/2023  Medication Sig   acetaminophen  (TYLENOL ) 500 MG tablet Take 1,000 mg by mouth every 6 (six) hours as needed for mild pain.   albuterol  (PROAIR  HFA) 108 (90 Base) MCG/ACT inhaler Inhale 1-2 puffs into the lungs every 6 (six) hours as needed for wheezing or shortness of breath.   ALPRAZolam  (XANAX ) 0.5 MG tablet Take 1 tablet (0.5 mg total) by mouth every 8 (eight) hours as needed for anxiety. Do not drive for 8 hours after use.   amLODipine  (NORVASC ) 5 MG tablet Take 1 tablet (5 mg total) by mouth 2 (two) times daily.   aspirin  81 MG chewable tablet Chew 81 mg by mouth daily.    budesonide -formoterol  (SYMBICORT ) 160-4.5 MCG/ACT inhaler Inhale 2 puffs into the lungs 2 (two) times daily. (Patient taking differently: Inhale 2 puffs into the lungs 2 (two) times daily.)   cilostazol  (PLETAL ) 100 MG tablet Take 1 tablet (100 mg total) by mouth 2 (two) times daily.   cyanocobalamin  1000 MCG tablet Take 1,000 mcg by mouth daily.   EPINEPHrine  0.3 mg/0.3 mL IJ SOAJ injection Inject 0.3 mg into the muscle as needed for anaphylaxis.   estradiol  (CLIMARA  - DOSED IN MG/24 HR) 0.0375 mg/24hr patch Place 1 patch onto the skin once a week   estradiol  (CLIMARA  - DOSED IN MG/24 HR) 0.0375 mg/24hr patch Place 1  patch (0.0375 mg total) onto the skin once a week.   FLUoxetine  (PROZAC ) 40 MG capsule Take 1 capsule (40 mg total) by mouth daily.   HYDROcodone -acetaminophen  (NORCO) 10-325 MG tablet TAKE (1) TABLET EVERY FOUR TO SIX HOURS AS NEEDED. Do not drive for 6 hours after use.   isosorbide  mononitrate (IMDUR ) 60 MG 24 hr tablet Take 1 tablet (60 mg total) by mouth daily.   ondansetron  (ZOFRAN -ODT) 4 MG disintegrating tablet Take 1 tablet (4 mg total) by mouth every 8 (eight) hours as needed for nausea or vomiting.   potassium chloride  SA (KLOR-CON  M) 20 MEQ tablet Take 1 tablet (20 mEq total) by mouth in the morning AND 2 tablets (40 mEq total) every evening.   Rimegepant Sulfate  (NURTEC) 75 MG TBDP Take 75 mg by mouth daily as needed.   rosuvastatin  (CRESTOR ) 10 MG tablet Take 1 tablet (10 mg total) by mouth once daily (Patient taking differently: Take 1 tablet (10 mg total) by mouth once daily)   triamterene -hydrochlorothiazide  (MAXZIDE -25) 37.5-25 MG tablet Take 1 tablet by mouth daily.   [DISCONTINUED] amLODipine  (NORVASC ) 5 MG tablet Take 1 tablet (5 mg total) by mouth 2 (two) times daily. (Patient not taking: Reported on 12/04/2023)   [DISCONTINUED] calcium  elemental as carbonate (BARIATRIC TUMS ULTRA) 400 MG chewable tablet Chew by mouth. (Patient not taking: Reported on 12/04/2023)    [DISCONTINUED] cilostazol  (PLETAL ) 100 MG tablet Take 1 tablet (100 mg total) by mouth 2 (two) times daily (Patient not taking: Reported on 12/04/2023)   [DISCONTINUED] FLUoxetine  (PROZAC ) 40 MG capsule Take 1 capsule (40 mg total) by mouth daily. (Patient not taking: Reported on 12/04/2023)   [DISCONTINUED] isosorbide  mononitrate (IMDUR ) 60 MG 24 hr tablet Take 1 tablet (60 mg total) by mouth daily. (Patient not taking: Reported on 12/04/2023)   [DISCONTINUED] potassium chloride  SA (KLOR-CON  M) 20 MEQ tablet Take 1 tablet (20 mEq total) by mouth daily. (Patient not taking: Reported on 12/04/2023)   [DISCONTINUED] potassium chloride  SA (KLOR-CON  M) 20 MEQ tablet Take 1 tablet (20 mEq total) by mouth 2 (two) times daily. (Patient not taking: Reported on 12/04/2023)   [DISCONTINUED] rosuvastatin  (CRESTOR ) 10 MG tablet Alternate 1 tablet (10 mg) by mouth and 2 tablets (20 mg) by mouth every other night. (Patient not taking: Reported on 12/04/2023)   [DISCONTINUED] spironolactone  (ALDACTONE ) 25 MG tablet Take 1 tablet (25 mg total) by mouth once daily   [DISCONTINUED] triamterene -hydrochlorothiazide  (MAXZIDE -25) 37.5-25 MG tablet Take 1 tablet by mouth once daily (Patient not taking: Reported on 12/04/2023)   No facility-administered encounter medications on file as of 12/04/2023.     Review of Systems  Review of Systems  N/a Physical Exam  BP 114/66   Pulse 62   Ht 5' 0.5 (1.537 m)   Wt 133 lb 6.4 oz (60.5 kg)   LMP 03/31/1977   SpO2 95% Comment: RA  BMI 25.62 kg/m   Wt Readings from Last 5 Encounters:  12/04/23 133 lb 6.4 oz (60.5 kg)  06/10/23 137 lb (62.1 kg)  05/12/23 139 lb (63 kg)  03/31/23 137 lb (62.1 kg)  12/17/22 136 lb (61.7 kg)    BMI Readings from Last 5 Encounters:  12/04/23 25.62 kg/m  06/10/23 25.89 kg/m  05/12/23 26.26 kg/m  03/31/23 25.89 kg/m  12/17/22 25.70 kg/m     Physical Exam General: Sitting in chair, no acute distress Eyes: EOMI, no icterus Neck: Supple,  no JVP Pulmonary: Clear, normal work of breathing Cardiovascular: Warm, no edema Abdomen: Nondistended,  bowel sounds present MSK: No synovitis, no joint effusion Neuro: Normal gait, no weakness Psych: Normal mood, full affect   Assessment & Plan:   Chronic cough: Suspect multifactorial.  Related to history of smoking, emphysema on CT versus asthma.  Historically improved with the Symbicort .  Now with worsening over last few months.  Cyclical bouts of bronchitis with multiple weeks of symptoms.  High suspicion for cough variant asthma.  Resume regular Symbicort  use, only using as needed and infrequently.  2 puffs twice a day.  Assess response.  Consider escalation in the future.  Pulmonary nodule: Increase in size right lower lobe for about 6 mm to 12 mm over 16 months or so.  High suspicion for malignancy.  Inflammatory nodule also considered.  Risks benefits for biopsy discussed.  Okay to move Welch with biopsy.  This is scheduled.  Will schedule PFTs and PET scan in the coming days in anticipation of need for surgery.  If this is not malignant these can be canceled.  No follow-ups on file.   Donnice JONELLE Beals, MD    This appointment required 43 minutes of patient care (this includes precharting, chart review, review of results, face-to-face care, etc.).

## 2023-12-04 NOTE — Progress Notes (Signed)
 @Patient  ID: Kari Welch, female    DOB: 25-Aug-1945, 78 y.o.   MRN: [id number]  Chief Complaint  Patient presents with   Follow-up    CT F/u Cough worse in the mornings, clear/yellow sputum coughed up. Pt states Zpack helped somewhat for a week or two.     Referring provider: Katrinka Garnette KIDD, MD  HPI:   78 y.o. woman whom we are seeing for evaluation of chronic cough.  Most recent cardiology note reviewed.  With recent GI note reviewed.  Coughing worsened June 2025.  Sound like an of the viral illness.  Prolonged bronchitis symptoms now for months.  Now dry cough.  Z-Pak improving for few days then back to prior levels.  May be gradually improved over time.  But certainly worse than baseline.  They note this happens a couple times a year but many weeks to months of increased cough and bronchitis symptoms.  She is not using her Symbicort  regularly.  We stressed the importance of regular Symbicort  use for maintenance inhaler for what felt to be cough variant asthma.  Cough is productive.  We discussed escalating therapies in the future but until we know how well she responds to ICS/LABA therapy it is hard to make additional decisions.  We discussed at length her recent CT scan.  The nodule that appears enlarged and changed in my eye.  We discussed risk and benefit of biopsies.  Next steps if malignant.  Additional imaging and PFTs if surgery needed.  HPI initial visit: Patient complains of cough.  Present for months.  At least 6 months if not longer.  Typically worse in the mornings.  Hacking cough.  Sometimes brings up phlegm.  Within the also has coughing flares throughout the day.  Seems to be some variability in terms of severity and frequency day-to-day.  No position to make things better or worse.  No seasonal or environmental factors she can identify to make things better or worse.  No really alleviating or exacerbating factors.  Recently prescribed Symbicort .   She had chest  imaging 05/2022, chest x-ray that on my review and interpretation reveals clear lungs with mild hyperinflation.  More recently, she had a CT chest that on my review interpretation shows fairly significant emphysematous changes with a predilection for the upper lobes, otherwise clear lungs, scattered nodules.   Questionaires / Pulmonary Flowsheets:   ACT:      No data to display          MMRC:     No data to display          Epworth:      No data to display          Tests:   FENO:  No results found for: NITRICOXIDE  PFT:     No data to display          WALK:      No data to display          Imaging: Personally reviewed and as per EMR CT Super D Chest Wo Contrast Addendum Date: 11/27/2023 ADDENDUM REPORT: 11/27/2023 09:17 ADDENDUM: Patient does not have a history of breast cancer, as dictated in the original report. Electronically Signed   By: Newell Eke M.D.   On: 11/27/2023 09:17   Result Date: 11/27/2023 CLINICAL DATA:  Lung nodules, emphysema.  Left breast cancer. EXAM: CT CHEST WITHOUT CONTRAST TECHNIQUE: Multidetector CT imaging of the chest was performed using thin slice collimation for electromagnetic bronchoscopy  planning purposes, without intravenous contrast. RADIATION DOSE REDUCTION: This exam was performed according to the departmental dose-optimization program which includes automated exposure control, adjustment of the mA and/or kV according to patient size and/or use of iterative reconstruction technique. COMPARISON:  12/15/2022 and 07/08/2022. FINDINGS: Cardiovascular: Atherosclerotic calcification of the aorta, aortic valve and coronary arteries. Enlarged pulmonic trunk and heart. No pericardial effusion. Mediastinum/Nodes: No pathologically enlarged mediastinal or axillary lymph nodes. Hilar regions are difficult to definitively evaluate without IV contrast. Esophagus is grossly unremarkable. Lungs/Pleura: Centrilobular and paraseptal  emphysema. New nodular consolidation in the medial right lower lobe measures 9 x 12 mm (5/102). Minimal dependent atelectasis bilaterally. Previously question 3 mm lingular nodule is not visualized. Chronic mild volume loss in the lingula. No pleural fluid. Airway is unremarkable. Upper Abdomen: Cholecystectomy. Visualized portions of the liver, adrenal glands, kidneys, spleen, pancreas, stomach and bowel are otherwise grossly unremarkable. No upper abdominal adenopathy. Musculoskeletal: None. IMPRESSION: 1. New nodular consolidation in the medial right lower lobe may be infectious/inflammatory in etiology. Recommend follow-up CT chest without contrast in 3-4 weeks to ensure resolution. Malignancy cannot be excluded. 2. Aortic atherosclerosis (ICD10-I70.0). Coronary artery calcification. 3. Enlarged pulmonic trunk, indicative of pulmonary arterial hypertension. 4.  Emphysema (ICD10-J43.9). Electronically Signed: By: Newell Eke M.D. On: 11/25/2023 11:41   MM 3D SCREENING MAMMOGRAM BILATERAL BREAST Result Date: 11/14/2023 CLINICAL DATA:  Screening. EXAM: MM DIGITAL SCREENING BILAT W/ TOMO AND CAD TECHNIQUE: Bilateral screening digital craniocaudal and mediolateral oblique mammograms were obtained. Bilateral screening digital breast tomosynthesis was performed. The images were evaluated with computer-aided detection. COMPARISON:  Previous exam(s). ACR Breast Density Category c: The breasts are heterogeneously dense, which may obscure small masses. FINDINGS: There are no findings suspicious for malignancy. IMPRESSION: No mammographic evidence of malignancy. A result letter of this screening mammogram will be mailed directly to the patient. RECOMMENDATION: Screening mammogram in one year. (Code:[id number]-01Y) BI-RADS CATEGORY  1: Negative. Electronically Signed   By: Rosina Gelineau M.D.   On: 11/14/2023 11:02    Lab Results: Personally reviewed CBC    Component Value Date/Time   WBC 6.5 05/12/2023 1101   RBC  4.20 05/12/2023 1101   HGB 14.8 05/12/2023 1101   HCT 44.4 05/12/2023 1101   PLT 379.0 05/12/2023 1101   MCV 105.6 (H) 05/12/2023 1101   MCH 34.9 (H) 11/09/2022 0525   MCHC 33.3 05/12/2023 1101   RDW 13.6 05/12/2023 1101   LYMPHSABS 1.5 05/12/2023 1101   MONOABS 0.6 05/12/2023 1101   EOSABS 0.1 05/12/2023 1101   BASOSABS 0.1 05/12/2023 1101    BMET    Component Value Date/Time   NA 136 08/20/2023 1103   K 3.7 08/20/2023 1103   CL 102 08/20/2023 1103   CO2 26 08/20/2023 1103   GLUCOSE 99 08/20/2023 1103   GLUCOSE 83 01/27/2006 1158   BUN 23 08/20/2023 1103   CREATININE 1.19 08/20/2023 1103   CREATININE 1.05 (H) 03/15/2020 1155   CALCIUM  9.2 08/20/2023 1103   GFRNONAA >60 11/09/2022 0525   GFRNONAA 53 (L) 03/15/2020 1155   GFRAA 61 03/15/2020 1155    BNP No results found for: BNP  ProBNP    Component Value Date/Time   PROBNP 34.0 07/16/2022 1000    Specialty Problems       Pulmonary Problems   Emphysema lung (HCC)   Noted on CT April 2024      Lung nodule    Allergies  Allergen Reactions   Methylprednisolone  Other (See Comments)  With IM injection- Panic attacks, trouble with concentration, hyperactive, hyperventilation   Prednisone  Swelling    short course=swelling    Immunization History  Administered Date(s) Administered   DTaP 12/25/2009   Fluad  Quad(high Dose 65+) 12/21/2018, 02/14/2020, 01/08/2021, 01/15/2022   Fluad  Trivalent(High Dose 65+) 12/19/2022   INFLUENZA, HIGH DOSE SEASONAL PF 02/26/2015, 04/24/2016, 01/20/2017, 03/10/2018   Influenza Split 01/17/2011   Influenza,inj,Quad PF,6+ Mos 01/07/2013, 01/13/2014   PFIZER Comirnaty (Gray Top)Covid-19 Tri-Sucrose Vaccine 01/15/2022   PFIZER(Purple Top)SARS-COV-2 Vaccination 05/06/2019, 05/27/2019, 12/06/2019   Pfizer Covid-19 Vaccine Bivalent Booster 51yrs & up 01/28/2021   Pfizer(Comirnaty )Fall Seasonal Vaccine 12 years and older 01/15/2022, 12/19/2022   Pneumococcal Conjugate-13  04/19/2013   Pneumococcal Polysaccharide-23 05/02/2015   Tdap 03/31/2008   Zoster Recombinant(Shingrix) 10/03/2020, 03/01/2021    Past Medical History:  Diagnosis Date   Abnormal Pap smear    h/o   Allergy    seasonal   Arthritis    Collagen vascular disease (HCC)    Elevated LFTs    Work up negative - Dr. Obie   Hyperlipidemia    Hyperplastic colon polyp    Hypertension    Internal hemorrhoids    INTERNAL HEMORRHOIDS 01/24/2009   Qualifier: Diagnosis of  By: Nelson-Smith CMA (AAMA), Dottie     Migraine    Prothrombin gene mutation (HCC)    heterozygous   PVD (peripheral vascular disease) (HCC)    takes Pletal  to open blood flow of blood to fingers, toes, and lower extremities   Raynaud's disease    Vasculitis (HCC)    Venous insufficiency    superficial left leg- seeing a vein specialist in August    Tobacco History: Social History   Tobacco Use  Smoking Status Former   Current packs/day: 0.00   Average packs/day: 0.5 packs/day for 18.0 years (9.0 ttl pk-yrs)   Types: Cigarettes   Start date: 10/11/1970   Quit date: 10/10/1988   Years since quitting: 35.1  Smokeless Tobacco Never  Tobacco Comments   Stopped smoking 2008.   Counseling given: Not Answered Tobacco comments: Stopped smoking 2008.   Continue to not smoke  Outpatient Encounter Medications as of 12/04/2023  Medication Sig   acetaminophen  (TYLENOL ) 500 MG tablet Take 1,000 mg by mouth every 6 (six) hours as needed for mild pain.   albuterol  (PROAIR  HFA) 108 (90 Base) MCG/ACT inhaler Inhale 1-2 puffs into the lungs every 6 (six) hours as needed for wheezing or shortness of breath.   ALPRAZolam  (XANAX ) 0.5 MG tablet Take 1 tablet (0.5 mg total) by mouth every 8 (eight) hours as needed for anxiety. Do not drive for 8 hours after use.   amLODipine  (NORVASC ) 5 MG tablet Take 1 tablet (5 mg total) by mouth 2 (two) times daily.   aspirin  81 MG chewable tablet Chew 81 mg by mouth daily.    budesonide -formoterol  (SYMBICORT ) 160-4.5 MCG/ACT inhaler Inhale 2 puffs into the lungs 2 (two) times daily. (Patient taking differently: Inhale 2 puffs into the lungs 2 (two) times daily.)   cilostazol  (PLETAL ) 100 MG tablet Take 1 tablet (100 mg total) by mouth 2 (two) times daily.   cyanocobalamin  1000 MCG tablet Take 1,000 mcg by mouth daily.   EPINEPHrine  0.3 mg/0.3 mL IJ SOAJ injection Inject 0.3 mg into the muscle as needed for anaphylaxis.   estradiol  (CLIMARA  - DOSED IN MG/24 HR) 0.0375 mg/24hr patch Place 1 patch onto the skin once a week   estradiol  (CLIMARA  - DOSED IN MG/24 HR) 0.0375 mg/24hr patch Place 1  patch (0.0375 mg total) onto the skin once a week.   FLUoxetine  (PROZAC ) 40 MG capsule Take 1 capsule (40 mg total) by mouth daily.   HYDROcodone -acetaminophen  (NORCO) 10-325 MG tablet TAKE (1) TABLET EVERY FOUR TO SIX HOURS AS NEEDED. Do not drive for 6 hours after use.   isosorbide  mononitrate (IMDUR ) 60 MG 24 hr tablet Take 1 tablet (60 mg total) by mouth daily.   ondansetron  (ZOFRAN -ODT) 4 MG disintegrating tablet Take 1 tablet (4 mg total) by mouth every 8 (eight) hours as needed for nausea or vomiting.   potassium chloride  SA (KLOR-CON  M) 20 MEQ tablet Take 1 tablet (20 mEq total) by mouth in the morning AND 2 tablets (40 mEq total) every evening.   Rimegepant Sulfate  (NURTEC) 75 MG TBDP Take 75 mg by mouth daily as needed.   rosuvastatin  (CRESTOR ) 10 MG tablet Take 1 tablet (10 mg total) by mouth once daily (Patient taking differently: Take 1 tablet (10 mg total) by mouth once daily)   triamterene -hydrochlorothiazide  (MAXZIDE -25) 37.5-25 MG tablet Take 1 tablet by mouth daily.   [DISCONTINUED] amLODipine  (NORVASC ) 5 MG tablet Take 1 tablet (5 mg total) by mouth 2 (two) times daily. (Patient not taking: Reported on 12/04/2023)   [DISCONTINUED] calcium  elemental as carbonate (BARIATRIC TUMS ULTRA) 400 MG chewable tablet Chew by mouth. (Patient not taking: Reported on 12/04/2023)    [DISCONTINUED] cilostazol  (PLETAL ) 100 MG tablet Take 1 tablet (100 mg total) by mouth 2 (two) times daily (Patient not taking: Reported on 12/04/2023)   [DISCONTINUED] FLUoxetine  (PROZAC ) 40 MG capsule Take 1 capsule (40 mg total) by mouth daily. (Patient not taking: Reported on 12/04/2023)   [DISCONTINUED] isosorbide  mononitrate (IMDUR ) 60 MG 24 hr tablet Take 1 tablet (60 mg total) by mouth daily. (Patient not taking: Reported on 12/04/2023)   [DISCONTINUED] potassium chloride  SA (KLOR-CON  M) 20 MEQ tablet Take 1 tablet (20 mEq total) by mouth daily. (Patient not taking: Reported on 12/04/2023)   [DISCONTINUED] potassium chloride  SA (KLOR-CON  M) 20 MEQ tablet Take 1 tablet (20 mEq total) by mouth 2 (two) times daily. (Patient not taking: Reported on 12/04/2023)   [DISCONTINUED] rosuvastatin  (CRESTOR ) 10 MG tablet Alternate 1 tablet (10 mg) by mouth and 2 tablets (20 mg) by mouth every other night. (Patient not taking: Reported on 12/04/2023)   [DISCONTINUED] spironolactone  (ALDACTONE ) 25 MG tablet Take 1 tablet (25 mg total) by mouth once daily   [DISCONTINUED] triamterene -hydrochlorothiazide  (MAXZIDE -25) 37.5-25 MG tablet Take 1 tablet by mouth once daily (Patient not taking: Reported on 12/04/2023)   No facility-administered encounter medications on file as of 12/04/2023.     Review of Systems  Review of Systems  N/a Physical Exam  BP 114/66   Pulse 62   Ht 5' 0.5 (1.537 m)   Wt 133 lb 6.4 oz (60.5 kg)   LMP 03/31/1977   SpO2 95% Comment: RA  BMI 25.62 kg/m   Wt Readings from Last 5 Encounters:  12/04/23 133 lb 6.4 oz (60.5 kg)  06/10/23 137 lb (62.1 kg)  05/12/23 139 lb (63 kg)  03/31/23 137 lb (62.1 kg)  12/17/22 136 lb (61.7 kg)    BMI Readings from Last 5 Encounters:  12/04/23 25.62 kg/m  06/10/23 25.89 kg/m  05/12/23 26.26 kg/m  03/31/23 25.89 kg/m  12/17/22 25.70 kg/m     Physical Exam General: Sitting in chair, no acute distress Eyes: EOMI, no icterus Neck: Supple,  no JVP Pulmonary: Clear, normal work of breathing Cardiovascular: Warm, no edema Abdomen: Nondistended,  bowel sounds present MSK: No synovitis, no joint effusion Neuro: Normal gait, no weakness Psych: Normal mood, full affect   Assessment & Plan:   Chronic cough: Suspect multifactorial.  Related to history of smoking, emphysema on CT versus asthma.  Historically improved with the Symbicort .  Now with worsening over last few months.  Cyclical bouts of bronchitis with multiple weeks of symptoms.  High suspicion for cough variant asthma.  Resume regular Symbicort  use, only using as needed and infrequently.  2 puffs twice a day.  Assess response.  Consider escalation in the future.  Pulmonary nodule: Increase in size right lower lobe for about 6 mm to 12 mm over 16 months or so.  High suspicion for malignancy.  Inflammatory nodule also considered.  Risks benefits for biopsy discussed.  Okay to move Welch with biopsy.  This is scheduled.  Will schedule PFTs and PET scan in the coming days in anticipation of need for surgery.  If this is not malignant these can be canceled.  No follow-ups on file.   Kari JONELLE Beals, MD    This appointment required 43 minutes of patient care (this includes precharting, chart review, review of results, face-to-face care, etc.).

## 2023-12-04 NOTE — Telephone Encounter (Signed)
 Lm for patient to schedule PFT.

## 2023-12-04 NOTE — Telephone Encounter (Signed)
-----   Message from Donnice SAUNDERS [facility] sent at 12/04/2023  1:26 PM EDT ----- This was double booked and unable to schedule PFT as staff at lunch.  Can you assist in scheduling PFT for patient within the dates specified in the order?  Thank you.

## 2023-12-04 NOTE — Progress Notes (Signed)
 Anesthesia Chart Review: SAME DAY WORK-UP  Case: [id number] Date/Time: 12/07/23 0730   Procedure: VIDEO BRONCHOSCOPY WITH ENDOBRONCHIAL NAVIGATION (Right)   Anesthesia type: General   Diagnosis: Lung nodule [R91.1]   Pre-op diagnosis: lung nodule   [facility]   Surgeons: Shelah Lamar RAMAN, MD       DISCUSSION: Patient is a 78 year old female scheduled for the above procedure. Last evaluated by her primary pulmonologist Dr. Annella on 12/04/2023 for chronic cough and enlarging RLL pulmonary nodule. Chronic cough felt likely multifactorial, and with some worsening over the past several months. He suspected cough variant asthma and asked her to resume regular Symbicort  use which had helped in the past. In regards to nodule, there was a high suspicion for malignancy, although inflammatory nodule possible. Biopsy had already been scheduled for 12/07/2023. He planned to schedule PFTs and PET scan if nodule found to be malignant.   Other history includes former smoker (quit 10/10/1988), HTN, HLD, PVD, venous insufficiency (s/p BLE endovenous laser ablation GSV 2014. Sclerotherapy right anterior accessory GSV 03/02/2024), Raynaud's disease (s/p digital sympathectomy at [facility]), collagen vascular disease (Scleroderma-like vasculopathy), heterozygous Prothrombin gene mutation, elevated LFTs (recurrent), choledocholithiasis (s/p cholecystectomy 11/09/2022), alpha gal allergy.   Notes indicate her husband is Dr. Danial Forward, a retired neurologist.  She is followed by cardiologist Dr. Lenor for HTN, HLD, PVD, Raynaud's with small vessel disease. Continue amlodipine , Imdur , fluoxetine  for Raynaud's phenomenon. She is on Pletal  for PVD--and ASA 81 mg as well. Referred to IR for treatment option of increased superficial varicosities. 12 month follow-up recommended at 06/22/2023 visit.   Dr. Shelah advised holding ASA for 2 days prior to procedure. RN clarified with Dr. Shelah and  he wants her to hold Pletal  starting 12/05/2023.   She is for anesthesia team evaluation and updated labs and EKG as indicated on the day of surgery.  LFTs normal on 05/12/2023.   VS: LMP 03/31/1977  Wt Readings from Last 3 Encounters:  12/04/23 60.5 kg  06/10/23 62.1 kg  05/12/23 63 kg   BP Readings from Last 3 Encounters:  12/04/23 114/66  06/10/23 118/70  05/12/23 110/62   Pulse Readings from Last 3 Encounters:  12/04/23 62  06/10/23 63  05/12/23 63    PROVIDERS: Katrinka Garnette KIDD, MD is PCP  Annella Cough, MD is pulmonologist Lenor Branch, MD is cardiologist (for Raynaud's disease with obliterating vasculopathy involving fingers and toes) Karalee Beat, MD is IR (for varicose veins) Carlie Clark, MD is ENT   LABS: For updated labs on arrival as indicated.  Most recent results in [facility] include: Lab Results  Component Value Date   WBC 6.5 05/12/2023   HGB 14.8 05/12/2023   HCT 44.4 05/12/2023   PLT 379.0 05/12/2023   GLUCOSE 99 08/20/2023   CHOL 184 05/12/2023   TRIG 87.0 05/12/2023   HDL 97.90 05/12/2023   LDLDIRECT 58.0 09/18/2020   LDLCALC 69 05/12/2023   ALT 13 05/12/2023   AST 19 05/12/2023   NA 136 08/20/2023   K 3.7 08/20/2023   CL 102 08/20/2023   CREATININE 1.19 08/20/2023   BUN 23 08/20/2023   CO2 26 08/20/2023   TSH 6.06 (H) 05/12/2023   INR 1.1 11/08/2022    IMAGES: CT Super D Chest 11/23/2023: IMPRESSION: 1. New nodular consolidation in the medial right lower lobe may be infectious/inflammatory in etiology. Recommend follow-up CT chest without contrast in 3-4 weeks to ensure resolution. Malignancy cannot be excluded. 2. Aortic  atherosclerosis (ICD10-I70.0). Coronary artery calcification. 3. Enlarged pulmonic trunk, indicative of pulmonary arterial hypertension. 4.  Emphysema (ICD10-J43.9). ADDENDUM: Patient does not have a history of breast cancer, as dictated in the original report.   EKG: For day of surgery as  indicated.  Last EKG noted was from 10/11/2013 and showed SB at 46 bpm, possible LAE.   CV: N/A  Past Medical History:  Diagnosis Date   Abnormal Pap smear    h/o   Allergy    seasonal   Allergy to alpha-gal    Arthritis    Collagen vascular disease (HCC)    Elevated LFTs    Work up negative - Dr. Obie   Hyperlipidemia    Hyperplastic colon polyp    Hypertension    Internal hemorrhoids    INTERNAL HEMORRHOIDS 01/24/2009   Qualifier: Diagnosis of  By: Marcelo CMA (AAMA), Dottie     Migraine    Prothrombin gene mutation (HCC)    heterozygous   PVD (peripheral vascular disease) (HCC)    takes Pletal  to open blood flow of blood to fingers, toes, and lower extremities   Raynaud's disease    Vasculitis (HCC)    Venous insufficiency    superficial left leg- seeing a vein specialist in August    Past Surgical History:  Procedure Laterality Date   ABDOMINAL HYSTERECTOMY  1979   TAH/LSO   ABLATION SAPHENOUS VEIN W/ RFA     BIOPSY  11/08/2022   Procedure: BIOPSY;  Surgeon: Wilhelmenia Aloha Raddle., MD;  Location: [facility];  Service: Gastroenterology;;   BREAST BIOPSY Left 10/27/2022   MM LT BREAST BX W LOC DEV 1ST LESION IMAGE BX SPEC STEREO GUIDE 10/27/2022 [facility]   BUNIONECTOMY WITH HAMMERTOE RECONSTRUCTION Left 10/13/2013   Procedure: LEFT FIRST METATARSAL SCARF OSTEOTOMY;  LEFT MODIFIED MCBRIDE BUNIONECTOMY AND SECOND HAMMERTOE CORRECTION;  Surgeon: Norleen Armor, MD;  Location: [facility];  Service: Orthopedics;  Laterality: Left;   CATARACT EXTRACTION Bilateral 09/2014   Dr. Lavonia   CERVIX LESION DESTRUCTION  1970   CHOLECYSTECTOMY N/A 11/09/2022   Procedure: LAPAROSCOPIC CHOLECYSTECTOMY;  Surgeon: Dasie Leonor CROME, MD;  Location: [facility];  Service: General;  Laterality: N/A;   COLONOSCOPY     COLONOSCOPY     DILATION AND CURETTAGE OF UTERUS     ERCP N/A 11/08/2022   Procedure: ENDOSCOPIC RETROGRADE CHOLANGIOPANCREATOGRAPHY (ERCP);  Surgeon:  Wilhelmenia Aloha Raddle., MD;  Location: [facility];  Service: Gastroenterology;  Laterality: N/A;   ESOPHAGOGASTRODUODENOSCOPY N/A 11/08/2022   Procedure: ESOPHAGOGASTRODUODENOSCOPY (EGD);  Surgeon: Wilhelmenia Aloha Raddle., MD;  Location: [facility];  Service: Gastroenterology;  Laterality: N/A;   IR RADIOLOGIST EVAL & MGMT  09/03/2023   LASER ABLATION Bilateral    leg-left 8/14, right 12/14   MASS EXCISION Left    hand   osteopenia of hip  2017   T score -1.1   PELVIC LAPAROSCOPY     d/t infertility   REMOVAL OF STONES  11/08/2022   Procedure: REMOVAL OF STONES;  Surgeon: Wilhelmenia Aloha Raddle., MD;  Location: [facility];  Service: Gastroenterology;;   right bartholin's gland cyst excision Right    1990s   SPHINCTEROTOMY  11/08/2022   Procedure: SPHINCTEROTOMY;  Surgeon: Mansouraty, Aloha Raddle., MD;  Location: [facility];  Service: Gastroenterology;;   sympthectomy Left 2008   hand ([facility])    MEDICATIONS: No current facility-administered medications for this encounter.    acetaminophen  (TYLENOL ) 500 MG tablet   albuterol  (PROAIR  HFA) 108 (90 Base) MCG/ACT  inhaler   ALPRAZolam  (XANAX ) 0.5 MG tablet   amLODipine  (NORVASC ) 5 MG tablet   aspirin  81 MG chewable tablet   budesonide -formoterol  (SYMBICORT ) 160-4.5 MCG/ACT inhaler   cilostazol  (PLETAL ) 100 MG tablet   cyanocobalamin  1000 MCG tablet   EPINEPHrine  0.3 mg/0.3 mL IJ SOAJ injection   estradiol  (CLIMARA  - DOSED IN MG/24 HR) 0.0375 mg/24hr patch   estradiol  (CLIMARA  - DOSED IN MG/24 HR) 0.0375 mg/24hr patch   FLUoxetine  (PROZAC ) 40 MG capsule   HYDROcodone -acetaminophen  (NORCO) 10-325 MG tablet   isosorbide  mononitrate (IMDUR ) 60 MG 24 hr tablet   ondansetron  (ZOFRAN -ODT) 4 MG disintegrating tablet   potassium chloride  SA (KLOR-CON  M) 20 MEQ tablet   Rimegepant Sulfate  (NURTEC) 75 MG TBDP   rosuvastatin  (CRESTOR ) 10 MG tablet   triamterene -hydrochlorothiazide  (MAXZIDE -25) 37.5-25 MG tablet    Isaiah Ruder,  PA-C Surgical Short Stay/Anesthesiology [facility] Phone [phone] [facility] Phone [phone] 12/04/2023 5:28 PM

## 2023-12-07 ENCOUNTER — Ambulatory Visit (HOSPITAL_BASED_OUTPATIENT_CLINIC_OR_DEPARTMENT_OTHER): Payer: Self-pay | Admitting: Vascular Surgery

## 2023-12-07 ENCOUNTER — Other Ambulatory Visit: Payer: Self-pay

## 2023-12-07 ENCOUNTER — Encounter (HOSPITAL_COMMUNITY)
Admission: RE | Disposition: A | Discharge status: Home / Self Care | Payer: Self-pay | Source: Home / Self Care | Attending: Emergency Medicine

## 2023-12-07 ENCOUNTER — Ambulatory Visit (HOSPITAL_COMMUNITY): Payer: Self-pay | Admitting: Vascular Surgery

## 2023-12-07 ENCOUNTER — Ambulatory Visit (HOSPITAL_COMMUNITY)

## 2023-12-07 ENCOUNTER — Ambulatory Visit (HOSPITAL_COMMUNITY)
Admission: RE | Admit: 2023-12-07 | Discharge: 2023-12-07 | Disposition: A | Discharge status: Home / Self Care | Attending: Emergency Medicine | Admitting: Emergency Medicine

## 2023-12-07 ENCOUNTER — Encounter: Payer: Self-pay | Admitting: Pulmonary Disease

## 2023-12-07 ENCOUNTER — Encounter (HOSPITAL_COMMUNITY): Payer: Self-pay | Admitting: Emergency Medicine

## 2023-12-07 ENCOUNTER — Telehealth: Payer: Self-pay | Admitting: Pulmonary Disease

## 2023-12-07 DIAGNOSIS — Z7951 Long term (current) use of inhaled steroids: Secondary | ICD-10-CM | POA: Insufficient documentation

## 2023-12-07 DIAGNOSIS — I10 Essential (primary) hypertension: Secondary | ICD-10-CM | POA: Diagnosis not present

## 2023-12-07 DIAGNOSIS — R519 Headache, unspecified: Secondary | ICD-10-CM | POA: Diagnosis not present

## 2023-12-07 DIAGNOSIS — J439 Emphysema, unspecified: Secondary | ICD-10-CM | POA: Diagnosis not present

## 2023-12-07 DIAGNOSIS — E785 Hyperlipidemia, unspecified: Secondary | ICD-10-CM | POA: Insufficient documentation

## 2023-12-07 DIAGNOSIS — M1990 Unspecified osteoarthritis, unspecified site: Secondary | ICD-10-CM | POA: Diagnosis not present

## 2023-12-07 DIAGNOSIS — R059 Cough, unspecified: Secondary | ICD-10-CM | POA: Diagnosis not present

## 2023-12-07 DIAGNOSIS — Z79899 Other long term (current) drug therapy: Secondary | ICD-10-CM | POA: Diagnosis not present

## 2023-12-07 DIAGNOSIS — R911 Solitary pulmonary nodule: Secondary | ICD-10-CM

## 2023-12-07 DIAGNOSIS — Z7902 Long term (current) use of antithrombotics/antiplatelets: Secondary | ICD-10-CM | POA: Diagnosis not present

## 2023-12-07 DIAGNOSIS — Z87891 Personal history of nicotine dependence: Secondary | ICD-10-CM | POA: Diagnosis not present

## 2023-12-07 DIAGNOSIS — I739 Peripheral vascular disease, unspecified: Secondary | ICD-10-CM | POA: Insufficient documentation

## 2023-12-07 DIAGNOSIS — Z48813 Encounter for surgical aftercare following surgery on the respiratory system: Secondary | ICD-10-CM | POA: Diagnosis not present

## 2023-12-07 DIAGNOSIS — N183 Chronic kidney disease, stage 3 unspecified: Secondary | ICD-10-CM

## 2023-12-07 DIAGNOSIS — I129 Hypertensive chronic kidney disease with stage 1 through stage 4 chronic kidney disease, or unspecified chronic kidney disease: Secondary | ICD-10-CM | POA: Diagnosis not present

## 2023-12-07 DIAGNOSIS — Z7982 Long term (current) use of aspirin: Secondary | ICD-10-CM | POA: Insufficient documentation

## 2023-12-07 DIAGNOSIS — I700 Atherosclerosis of aorta: Secondary | ICD-10-CM | POA: Diagnosis not present

## 2023-12-07 HISTORY — DX: COVID-19: U07.1

## 2023-12-07 HISTORY — PX: FIDUCIAL MARKER PLACEMENT: SHX6858

## 2023-12-07 HISTORY — PX: BRONCHIAL NEEDLE ASPIRATION BIOPSY: SHX5106

## 2023-12-07 HISTORY — DX: Unspecified asthma, uncomplicated: J45.909

## 2023-12-07 HISTORY — DX: Emphysema, unspecified: J43.9

## 2023-12-07 HISTORY — PX: BRONCHIAL WASHINGS: SHX5105

## 2023-12-07 HISTORY — PX: BRONCHIAL BIOPSY: SHX5109

## 2023-12-07 HISTORY — PX: VIDEO BRONCHOSCOPY WITH ENDOBRONCHIAL NAVIGATION: SHX6175

## 2023-12-07 HISTORY — DX: Pneumonia, unspecified organism: J18.9

## 2023-12-07 HISTORY — DX: Allergy to other foods: Z91.018

## 2023-12-07 LAB — CBC
HCT: 35.6 % — ABNORMAL LOW (ref 36.0–46.0)
Hemoglobin: 12.3 g/dL (ref 12.0–15.0)
MCH: 35.4 pg — ABNORMAL HIGH (ref 26.0–34.0)
MCHC: 34.6 g/dL (ref 30.0–36.0)
MCV: 102.6 fL — ABNORMAL HIGH (ref 80.0–100.0)
Platelets: 303 K/uL (ref 150–400)
RBC: 3.47 MIL/uL — ABNORMAL LOW (ref 3.87–5.11)
RDW: 13.9 % (ref 11.5–15.5)
WBC: 6.8 K/uL (ref 4.0–10.5)
nRBC: 0 % (ref 0.0–0.2)

## 2023-12-07 LAB — COMPREHENSIVE METABOLIC PANEL WITH GFR
ALT: 19 U/L (ref 0–44)
AST: 25 U/L (ref 15–41)
Albumin: 3.2 g/dL — ABNORMAL LOW (ref 3.5–5.0)
Alkaline Phosphatase: 46 U/L (ref 38–126)
Anion gap: 9 (ref 5–15)
BUN: 22 mg/dL (ref 8–23)
CO2: 22 mmol/L (ref 22–32)
Calcium: 8.5 mg/dL — ABNORMAL LOW (ref 8.9–10.3)
Chloride: 106 mmol/L (ref 98–111)
Creatinine, Ser: 1.05 mg/dL — ABNORMAL HIGH (ref 0.44–1.00)
GFR, Estimated: 55 mL/min — ABNORMAL LOW (ref >60)
Glucose, Bld: 101 mg/dL — ABNORMAL HIGH (ref 70–99)
Potassium: 3.4 mmol/L — ABNORMAL LOW (ref 3.5–5.1)
Sodium: 137 mmol/L (ref 135–145)
Total Bilirubin: 0.5 mg/dL (ref 0.0–1.2)
Total Protein: 6.5 g/dL (ref 6.5–8.1)

## 2023-12-07 SURGERY — VIDEO BRONCHOSCOPY WITH ENDOBRONCHIAL NAVIGATION
Anesthesia: General | Laterality: Right

## 2023-12-07 MED ORDER — SUGAMMADEX SODIUM 200 MG/2ML IV SOLN
INTRAVENOUS | Status: DC | PRN
  Administered 2023-12-07: 200 mg via INTRAVENOUS

## 2023-12-07 MED ORDER — LACTATED RINGERS IV SOLN: INTRAVENOUS | Status: DC

## 2023-12-07 MED ORDER — ACETAMINOPHEN 10 MG/ML IV SOLN: 1000.0000 mg | Freq: Once | INTRAVENOUS | Status: DC | PRN

## 2023-12-07 MED ORDER — ROCURONIUM BROMIDE 10 MG/ML (PF) SYRINGE
PREFILLED_SYRINGE | INTRAVENOUS | Status: DC | PRN
  Administered 2023-12-07: 50 mg via INTRAVENOUS

## 2023-12-07 MED ORDER — OXYCODONE HCL 5 MG/5ML PO SOLN: 5.0000 mg | Freq: Once | ORAL | Status: DC | PRN

## 2023-12-07 MED ORDER — CHLORHEXIDINE GLUCONATE 0.12 % MT SOLN: 15.0000 mL | Freq: Once | OROMUCOSAL | Status: AC

## 2023-12-07 MED ORDER — CHLORHEXIDINE GLUCONATE 0.12 % MT SOLN
OROMUCOSAL | Status: AC
  Administered 2023-12-07: 15 mL via OROMUCOSAL
  Filled 2023-12-07: qty 15

## 2023-12-07 MED ORDER — ROSUVASTATIN CALCIUM 10 MG PO TABS
ORAL_TABLET | ORAL | Status: AC
Start: 2023-12-07 — End: ?

## 2023-12-07 MED ORDER — PROPOFOL 10 MG/ML IV BOLUS
INTRAVENOUS | Status: DC | PRN
  Administered 2023-12-07: 90 mg via INTRAVENOUS

## 2023-12-07 MED ORDER — FENTANYL CITRATE (PF) 250 MCG/5ML IJ SOLN
INTRAMUSCULAR | Status: DC | PRN
  Administered 2023-12-07: 100 ug via INTRAVENOUS

## 2023-12-07 MED ORDER — OXYCODONE HCL 5 MG PO TABS: 5.0000 mg | ORAL_TABLET | Freq: Once | ORAL | Status: DC | PRN

## 2023-12-07 MED ORDER — PHENYLEPHRINE 80 MCG/ML (10ML) SYRINGE FOR IV PUSH (FOR BLOOD PRESSURE SUPPORT)
PREFILLED_SYRINGE | INTRAVENOUS | Status: DC | PRN
  Administered 2023-12-07: 80 ug via INTRAVENOUS

## 2023-12-07 MED ORDER — LIDOCAINE 2% (20 MG/ML) 5 ML SYRINGE
INTRAMUSCULAR | Status: DC | PRN
  Administered 2023-12-07: 100 mg via INTRAVENOUS

## 2023-12-07 MED ORDER — FENTANYL CITRATE (PF) 100 MCG/2ML IJ SOLN: 25.0000 ug | INTRAMUSCULAR | Status: DC | PRN

## 2023-12-07 MED ORDER — EPHEDRINE SULFATE-NACL 50-0.9 MG/10ML-% IV SOSY
PREFILLED_SYRINGE | INTRAVENOUS | Status: DC | PRN
  Administered 2023-12-07: 10 mg via INTRAVENOUS

## 2023-12-07 MED ORDER — ONDANSETRON HCL 4 MG/2ML IJ SOLN
INTRAMUSCULAR | Status: DC | PRN
  Administered 2023-12-07: 4 mg via INTRAVENOUS

## 2023-12-07 MED ORDER — ONDANSETRON HCL 4 MG/2ML IJ SOLN: 4.0000 mg | Freq: Once | INTRAMUSCULAR | Status: DC | PRN

## 2023-12-07 SURGICAL SUPPLY — 37 items

## 2023-12-07 NOTE — Telephone Encounter (Signed)
 Pt had bronchoscopy today and PFT has already been scheduled for 9/11.  Pt would like to keep upcoming PFT appt. nfn

## 2023-12-07 NOTE — Telephone Encounter (Signed)
**Note De-identified  [place] Obfuscation** Please advise 

## 2023-12-07 NOTE — Telephone Encounter (Signed)
 Copied from CRM [id number]. Topic: General - Call Back - No Documentation >> Dec 04, 2023  4:13 PM Chantha C wrote: Reason for CRM: Patient [phone] is returning the office call, the message was to call back no details provided. Informed patient, the office is closed at 2:30 pm on Friday and cannot find documentation on why patient was called, will send a message and have the nurse call on Monday. Patient states she's scheduled for a bronchoscopy on Monday. Please advise and call back.

## 2023-12-07 NOTE — Telephone Encounter (Signed)
 PFT scheduled for 9/11. Will close encounter.

## 2023-12-07 NOTE — Anesthesia Procedure Notes (Signed)
 Procedure Name: Intubation Date/Time: 12/07/2023 7:47 AM  Performed by: Loreli Blima LABOR, CRNAPre-anesthesia Checklist: Patient identified, Emergency Drugs available, Suction available and Patient being monitored Patient Re-evaluated:Patient Re-evaluated prior to induction Oxygen Delivery Method: Circle System Utilized Preoxygenation: Pre-oxygenation with 100% oxygen Induction Type: IV induction Ventilation: Mask ventilation without difficulty Laryngoscope Size: Mac and 3 Grade View: Grade I Tube type: Oral Tube size: 8.5 mm Number of attempts: 1 Airway Equipment and Method: Stylet Placement Confirmation: ETT inserted through vocal cords under direct vision, positive ETCO2 and breath sounds checked- equal and bilateral Secured at: 22 cm Tube secured with: Tape Dental Injury: Teeth and Oropharynx as per pre-operative assessment

## 2023-12-07 NOTE — Op Note (Signed)
 Video Bronchoscopy with Robotic Assisted Bronchoscopic Navigation   Date of Operation: 12/07/2023   Pre-op Diagnosis: Right lower lobe pulmonary nodule  Post-op Diagnosis: Same  Surgeon: Lamar Chris  Assistants: None  Anesthesia: General endotracheal anesthesia  Operation: Flexible video fiberoptic bronchoscopy with robotic assistance and biopsies.  Estimated Blood Loss: Minimal  Complications: None  Indications and History: Kari Welch is a 78 y.o. female with history of cough and possible obstructive lung disease followed by Dr. Annella in our office.  She has an enlarging right lower lobe pulmonary nodule on CT chest.  Recommendation made to achieve a tissue diagnosis via robotic assisted navigational bronchoscopy.  The risks, benefits, complications, treatment options and expected outcomes were discussed with the patient.  The possibilities of pneumothorax, pneumonia, reaction to medication, pulmonary aspiration, perforation of a viscus, bleeding, failure to diagnose a condition and creating a complication requiring transfusion or operation were discussed with the patient who freely signed the consent.    Description of Procedure: The patient was seen in the Preoperative Area, was examined and was deemed appropriate to proceed.  The patient was taken to [facility] room 3, identified as Kari Welch and the procedure verified as Flexible Video Fiberoptic Bronchoscopy.  A Time Out was held and the above information confirmed.   Prior to the date of the procedure a high-resolution CT scan of the chest was performed. Utilizing ION software program a virtual tracheobronchial tree was generated to allow the creation of distinct navigation pathways to the patient's parenchymal abnormalities. After being taken to the operating room general anesthesia was initiated and the patient  was orally intubated. The video fiberoptic bronchoscope was introduced via the endotracheal tube and  a general inspection was performed which showed normal right and left lung anatomy. Aspiration of the bilateral mainstems was completed to remove any remaining secretions.  A left upper lobe bronchoalveolar lavage was performed with 50 cc normal saline instilled and approximately 25 cc returned.  This was sent for microbiology.  Robotic catheter inserted into patient's endotracheal tube.   Target #1 right lower lobe nodule: The distinct navigation pathways prepared prior to this procedure were then utilized to navigate to patient's lesion identified on CT scan. The robotic catheter was secured into place and the vision probe was withdrawn.  Lesion location was approximated using fluoroscopy.  Local registration and targeting was performed using [employer] Cios mobile C-arm three-dimensional imaging. Under fluoroscopic guidance transbronchial needle biopsies and transbronchial cryoprobe biopsies were performed to be sent for cytology and pathology.  Needle-in-lesion was confirmed using Cios mobile C-arm.  A bronchioalveolar lavage was performed in the right lower lobe adjacent to the nodule and sent for microbiology.  Under fluoroscopic guidance a single fiducial marker was placed adjacent to the nodule.   At the end of the procedure a general airway inspection was performed and there was no evidence of active bleeding. The bronchoscope was removed.  The patient tolerated the procedure well. There was no significant blood loss and there were no obvious complications. A post-procedural chest x-ray is pending.  Samples Target #1: 1. Transbronchial Wang needle biopsies from right lower lobe nodule 3. Transbronchial cryoprobe biopsies from right lower lobe nodule 4. Bronchoalveolar lavage from right lower lobe 5. Bronchoalveolar lavage from the left upper lobe    Plans:  The patient will be discharged from the PACU to home when recovered from anesthesia and after chest x-ray is reviewed. We  will review the cytology, pathology and microbiology results  with the patient when they become available. Outpatient followup will be with Dr. Annella.   Lamar Chris, MD, PhD 12/07/2023, 8:42 AM [facility] [phone] or if no answer before 7:00PM call [phone] For any issues after 7:00PM please call eLink [phone]

## 2023-12-07 NOTE — Transfer of Care (Signed)
 Immediate Anesthesia Transfer of Care Note  Patient: Kari Welch  Procedure(s) Performed: VIDEO BRONCHOSCOPY WITH ENDOBRONCHIAL NAVIGATION (Right) BRONCHOSCOPY, WITH NEEDLE ASPIRATION BIOPSY  Patient Location: PACU  Anesthesia Type:General  Level of Consciousness: awake and alert   Airway & Oxygen Therapy: Patient Spontanous Breathing  Post-op Assessment: Report given to RN and Post -op Vital signs reviewed and stable  Post vital signs: Reviewed and stable  Last Vitals:  Vitals Value Taken Time  BP 122/64 12/07/23 08:45  Temp 36.6 C 12/07/23 08:45  Pulse 69 12/07/23 08:47  Resp 16 12/07/23 08:47  SpO2 96 % 12/07/23 08:47  Vitals shown include unfiled device data.  Last Pain:  Vitals:   12/07/23 0619  PainSc: 0-No pain         Complications: No notable events documented.

## 2023-12-07 NOTE — Interval H&P Note (Signed)
 History and Physical Interval Note:  12/07/2023 7:31 AM  Kari Welch  has presented today for surgery, with the diagnosis of lung nodule.  The various methods of treatment have been discussed with the patient and family. After consideration of risks, benefits and other options for treatment, the patient has consented to  Procedure(s): VIDEO BRONCHOSCOPY WITH ENDOBRONCHIAL NAVIGATION (Right) as a surgical intervention.  The patient's history has been reviewed, patient examined, no change in status, stable for surgery.  I have reviewed the patient's chart and labs.  Questions were answered to the patient's satisfaction.     Lamar GORMAN Chris

## 2023-12-07 NOTE — Telephone Encounter (Signed)
 Copied from CRM [id number]. Topic: Clinical - Medical Advice >> Dec 07, 2023  8:58 AM [name] wrote: Reason for CRM: Spouse calling, requesting a call back from Dr.Hunsucker's nurse - states he's Dr.Eriksson & Dr.Hunsucker knows him. Call is in regard to scheduling a CT scan or bronchoscopy.   Callback number: [phone]  Duplicate, Nfn.

## 2023-12-07 NOTE — Op Note (Signed)
 Procedure Note  Patient: Kari Welch  [employer] Cios mobile C-arm was utilized to identify and biopsy right lower lobe pulmonary nodule.  Needle-in-lesion was confirmed using real-time Cios imaging, and images were uploaded to PACS.      Lamar Chris, MD, PhD 12/07/2023, 8:46 AM [facility] [phone] or if no answer before 7:00PM call [phone] For any issues after 7:00PM please call eLink [phone]

## 2023-12-07 NOTE — Discharge Instructions (Addendum)
 Flexible Bronchoscopy, Care After This sheet gives you information about how to care for yourself after your test. Your doctor may also give you more specific instructions. If you have problems or questions, contact your doctor. Follow these instructions at home: Eating and drinking When you are wide awake, your numbness is gone and your cough and gag reflexes have come back, you may: Start eating only soft foods. Slowly drink liquids. Six hours after the test, go back to your normal diet. Driving Do not drive for 24 hours if you were given a medicine to help you relax (sedative). Do not drive or use heavy machinery while taking prescription pain medicine. General instructions Take over-the-counter and prescription medicines only as told by your doctor. Return to your normal activities as told. Ask what activities are safe for you. Do not use any products that have nicotine or tobacco in them. This includes cigarettes and e-cigarettes. If you need help quitting, ask your doctor. Keep all follow-up visits as told by your doctor. This is important. It is very important if you had a tissue sample (biopsy) taken. Get help right away if: You have shortness of breath that gets worse. You get light-headed. You feel like you are going to pass out (faint). You have chest pain. You cough up: More than a little blood. More blood than before. Summary Do not use cigarettes. Do not use e-cigarettes. Seek care in the Emergency Department right away if you have chest pain or shortness of breath. Call or MyChart Message our office for any questions or problems at [phone].  Okay to restart Pletal  on 12/08/2023.   This information is not intended to replace advice given to you by your health care provider. Make sure you discuss any questions you have with your health care provider.

## 2023-12-07 NOTE — Anesthesia Postprocedure Evaluation (Signed)
 Anesthesia Post Note  Patient: Kari Welch  Procedure(s) Performed: VIDEO BRONCHOSCOPY WITH ENDOBRONCHIAL NAVIGATION (Right) BRONCHOSCOPY, WITH NEEDLE ASPIRATION BIOPSY INSERTION, FIDUCIAL MARKERS BRONCHOSCOPY, WITH BIOPSY IRRIGATION, BRONCHUS     Patient location during evaluation: PACU Anesthesia Type: General Level of consciousness: awake and alert Pain management: pain level controlled Vital Signs Assessment: post-procedure vital signs reviewed and stable Respiratory status: spontaneous breathing, nonlabored ventilation, respiratory function stable and patient connected to nasal cannula oxygen Cardiovascular status: blood pressure returned to baseline and stable Postop Assessment: no apparent nausea or vomiting Anesthetic complications: no   No notable events documented.  Last Vitals:  Vitals:   12/07/23 0900 12/07/23 0915  BP: 99/77 100/68  Pulse: 67 63  Resp: 20 13  Temp:  36.7 C  SpO2: 95% 94%    Last Pain:  Vitals:   12/07/23 0915  PainSc: 0-No pain                 Lynwood MARLA Cornea

## 2023-12-08 ENCOUNTER — Encounter (HOSPITAL_COMMUNITY): Payer: Self-pay | Admitting: Emergency Medicine

## 2023-12-08 LAB — ACID FAST SMEAR (AFB, MYCOBACTERIA)
Acid Fast Smear: NEGATIVE
Acid Fast Smear: NEGATIVE

## 2023-12-08 LAB — CYTOLOGY - NON PAP

## 2023-12-09 LAB — CULTURE, BAL-QUANTITATIVE W GRAM STAIN
Culture: NO GROWTH
Culture: NO GROWTH
Gram Stain: NONE SEEN

## 2023-12-09 LAB — FUNGUS STAIN

## 2023-12-09 LAB — FUNGAL STAIN REFLEX

## 2023-12-10 ENCOUNTER — Encounter

## 2023-12-11 ENCOUNTER — Other Ambulatory Visit (HOSPITAL_BASED_OUTPATIENT_CLINIC_OR_DEPARTMENT_OTHER): Payer: Self-pay

## 2023-12-11 ENCOUNTER — Telehealth: Payer: Self-pay | Admitting: Pulmonary Disease

## 2023-12-11 ENCOUNTER — Encounter: Payer: Self-pay | Admitting: Pulmonary Disease

## 2023-12-11 DIAGNOSIS — R911 Solitary pulmonary nodule: Secondary | ICD-10-CM

## 2023-12-11 MED ORDER — AMOXICILLIN-POT CLAVULANATE 875-125 MG PO TABS
1.0000 | ORAL_TABLET | Freq: Two times a day (BID) | ORAL | 0 refills | Status: AC
  Filled 2023-12-11: qty 14, 7d supply, fill #0

## 2023-12-11 NOTE — Telephone Encounter (Signed)
 Called and discussed results of biopsy, cytology results, with husband and patient.  Relayed negative cytology, no malignancy.  They inquired about pathology given biopsies were taken.  The cytology report indicates biopsies were included in the specimen.  I relayed this back, I do not expect additional pathology report.  Based on the result of no malignant cells but no other alternative diagnosis, I recommend a 69-month follow-up CT scan.  If enlarging at that time it would be reasonable to consider repeat biopsy versus definitive therapy of surgery versus radiation.  Augmentin  BID x 7 days sent if ongoing mild hemoptysis and discolored sputum.

## 2023-12-11 NOTE — Telephone Encounter (Signed)
 Can you advise?

## 2023-12-12 LAB — AEROBIC/ANAEROBIC CULTURE W GRAM STAIN (SURGICAL/DEEP WOUND)
Culture: NO GROWTH
Culture: NORMAL
Gram Stain: NONE SEEN

## 2023-12-14 ENCOUNTER — Encounter

## 2023-12-15 ENCOUNTER — Ambulatory Visit (HOSPITAL_COMMUNITY)

## 2023-12-16 ENCOUNTER — Other Ambulatory Visit (HOSPITAL_BASED_OUTPATIENT_CLINIC_OR_DEPARTMENT_OTHER): Payer: Self-pay

## 2023-12-16 ENCOUNTER — Other Ambulatory Visit

## 2023-12-18 ENCOUNTER — Ambulatory Visit: Admitting: Pulmonary Disease

## 2023-12-18 ENCOUNTER — Encounter: Payer: Self-pay | Admitting: Pulmonary Disease

## 2023-12-18 ENCOUNTER — Other Ambulatory Visit (HOSPITAL_BASED_OUTPATIENT_CLINIC_OR_DEPARTMENT_OTHER): Payer: Self-pay

## 2023-12-18 VITALS — BP 122/60 | HR 71 | Temp 98.0°F | Ht 61.0 in | Wt 132.6 lb

## 2023-12-18 DIAGNOSIS — R911 Solitary pulmonary nodule: Secondary | ICD-10-CM

## 2023-12-18 DIAGNOSIS — R053 Chronic cough: Secondary | ICD-10-CM | POA: Diagnosis not present

## 2023-12-18 MED ORDER — ALBUTEROL SULFATE HFA 108 (90 BASE) MCG/ACT IN AERS
2.0000 | INHALATION_SPRAY | Freq: Four times a day (QID) | RESPIRATORY_TRACT | 11 refills | Status: AC | PRN
  Filled 2023-12-18: qty 8.5, 28d supply, fill #0
  Filled 2024-01-08: qty 8.5, 28d supply, fill #1
  Filled 2024-02-09: qty 8.5, 28d supply, fill #2
  Filled 2024-03-04: qty 6.7, 25d supply, fill #3

## 2023-12-18 MED ORDER — BUDESONIDE-FORMOTEROL FUMARATE 160-4.5 MCG/ACT IN AERO
2.0000 | INHALATION_SPRAY | Freq: Two times a day (BID) | RESPIRATORY_TRACT | 11 refills | Status: AC
  Filled 2023-12-18: qty 10.2, 30d supply, fill #0
  Filled 2024-01-08 – 2024-01-11 (×2): qty 10.2, 30d supply, fill #1
  Filled 2024-02-09: qty 10.2, 30d supply, fill #2
  Filled 2024-03-04 – 2024-03-10 (×2): qty 10.2, 30d supply, fill #3

## 2023-12-18 NOTE — Progress Notes (Signed)
 @Patient  ID: Kari Welch, female    DOB: 1945-06-19, 78 y.o.   MRN: [id number]  Chief Complaint  Patient presents with   Follow-up    Review CT from 11/23/2023    Referring provider: Katrinka Garnette KIDD, MD  HPI:   78 y.o. woman whom we are seeing for evaluation of chronic cough felt to be related to poorly controlled asthma.  Returns for follow-up.  Bronchoscopy with biopsy performed in interim.  Nondiagnostic.  But no malignancy.  This was extensively discussed over the phone over the last several days.  Plan for repeat 67-month CT scan given no definitive diagnosis.  She has been dealing with return of cough.  At last visit 2 weeks ago we discussed resuming Symbicort  regularly using 2 puffs twice a day every day to see if taking maintenance of the regularly would be beneficial before making medication changes.  This seems to have helped the cough quite a bit.  In addition, she started Augmentin  I prescribed last week in the setting of worsening cough and phlegm changes after bronchoscopy.  She also thinks this helps.  HPI initial visit: Patient complains of cough.  Present for months.  At least 6 months if not longer.  Typically worse in the mornings.  Hacking cough.  Sometimes brings up phlegm.  Within the also has coughing flares throughout the day.  Seems to be some variability in terms of severity and frequency day-to-day.  No position to make things better or worse.  No seasonal or environmental factors she can identify to make things better or worse.  No really alleviating or exacerbating factors.  Recently prescribed Symbicort .   She had chest imaging 05/2022, chest x-ray that on my review and interpretation reveals clear lungs with mild hyperinflation.  More recently, she had a CT chest that on my review interpretation shows fairly significant emphysematous changes with a predilection for the upper lobes, otherwise clear lungs, scattered nodules.   Questionaires / Pulmonary  Flowsheets:   ACT:      No data to display          MMRC:     No data to display          Epworth:      No data to display          Tests:   FENO:  No results found for: NITRICOXIDE  PFT:     No data to display          WALK:      No data to display          Imaging: Personally reviewed and as per EMR DG Chest Port 1 View Result Date: 12/07/2023 EXAM: 1 VIEW XRAY OF THE CHEST 12/07/2023 08:54:14 AM COMPARISON: 05/27/2022 CLINICAL HISTORY: S/P bronchoscopy with biopsy FINDINGS: LUNGS AND PLEURA: No focal pulmonary opacity. No pulmonary edema. No pleural effusion. No pneumothorax. Aortic atherosclerotic calcifications. There is a new fiducial marker projecting over the medial right lung base. HEART AND MEDIASTINUM: No acute abnormality of the cardiac and mediastinal silhouettes. BONES AND SOFT TISSUES: No acute osseous abnormality. IMPRESSION: 1. Status post bronchoscopy. No pneumothorax. 2. New financial marker projecting over the medial right lung base. Electronically signed by: Waddell Calk MD 12/07/2023 09:03 AM EDT RP Workstation: [id number]   DG C-ARM BRONCHOSCOPY Result Date: 12/07/2023 C-ARM BRONCHOSCOPY: Fluoroscopy was utilized by the requesting physician.  No radiographic interpretation.   CT Super D Chest Wo Contrast Addendum Date: 11/27/2023 ADDENDUM REPORT: 11/27/2023 09:17 ADDENDUM:  Patient does not have a history of breast cancer, as dictated in the original report. Electronically Signed   By: Newell Eke M.D.   On: 11/27/2023 09:17   Result Date: 11/27/2023 CLINICAL DATA:  Lung nodules, emphysema.  Left breast cancer. EXAM: CT CHEST WITHOUT CONTRAST TECHNIQUE: Multidetector CT imaging of the chest was performed using thin slice collimation for electromagnetic bronchoscopy planning purposes, without intravenous contrast. RADIATION DOSE REDUCTION: This exam was performed according to the departmental dose-optimization program which includes  automated exposure control, adjustment of the mA and/or kV according to patient size and/or use of iterative reconstruction technique. COMPARISON:  12/15/2022 and 07/08/2022. FINDINGS: Cardiovascular: Atherosclerotic calcification of the aorta, aortic valve and coronary arteries. Enlarged pulmonic trunk and heart. No pericardial effusion. Mediastinum/Nodes: No pathologically enlarged mediastinal or axillary lymph nodes. Hilar regions are difficult to definitively evaluate without IV contrast. Esophagus is grossly unremarkable. Lungs/Pleura: Centrilobular and paraseptal emphysema. New nodular consolidation in the medial right lower lobe measures 9 x 12 mm (5/102). Minimal dependent atelectasis bilaterally. Previously question 3 mm lingular nodule is not visualized. Chronic mild volume loss in the lingula. No pleural fluid. Airway is unremarkable. Upper Abdomen: Cholecystectomy. Visualized portions of the liver, adrenal glands, kidneys, spleen, pancreas, stomach and bowel are otherwise grossly unremarkable. No upper abdominal adenopathy. Musculoskeletal: None. IMPRESSION: 1. New nodular consolidation in the medial right lower lobe may be infectious/inflammatory in etiology. Recommend follow-up CT chest without contrast in 3-4 weeks to ensure resolution. Malignancy cannot be excluded. 2. Aortic atherosclerosis (ICD10-I70.0). Coronary artery calcification. 3. Enlarged pulmonic trunk, indicative of pulmonary arterial hypertension. 4.  Emphysema (ICD10-J43.9). Electronically Signed: By: Newell Eke M.D. On: 11/25/2023 11:41    Lab Results: Personally reviewed CBC    Component Value Date/Time   WBC 6.8 12/07/2023 0616   RBC 3.47 (L) 12/07/2023 0616   HGB 12.3 12/07/2023 0616   HCT 35.6 (L) 12/07/2023 0616   PLT 303 12/07/2023 0616   MCV 102.6 (H) 12/07/2023 0616   MCH 35.4 (H) 12/07/2023 0616   MCHC 34.6 12/07/2023 0616   RDW 13.9 12/07/2023 0616   LYMPHSABS 1.5 05/12/2023 1101   MONOABS 0.6  05/12/2023 1101   EOSABS 0.1 05/12/2023 1101   BASOSABS 0.1 05/12/2023 1101    BMET    Component Value Date/Time   NA 137 12/07/2023 0616   K 3.4 (L) 12/07/2023 0616   CL 106 12/07/2023 0616   CO2 22 12/07/2023 0616   GLUCOSE 101 (H) 12/07/2023 0616   GLUCOSE 83 01/27/2006 1158   BUN 22 12/07/2023 0616   CREATININE 1.05 (H) 12/07/2023 0616   CREATININE 1.05 (H) 03/15/2020 1155   CALCIUM  8.5 (L) 12/07/2023 0616   GFRNONAA 55 (L) 12/07/2023 0616   GFRNONAA 53 (L) 03/15/2020 1155   GFRAA 61 03/15/2020 1155    BNP No results found for: BNP  ProBNP    Component Value Date/Time   PROBNP 34.0 07/16/2022 1000    Specialty Problems       Pulmonary Problems   Emphysema lung (HCC)   Noted on CT April 2024      Lung nodule    Allergies  Allergen Reactions   Alpha-Gal Hives   Methylprednisolone  Other (See Comments)    With IM injection- Panic attacks, trouble with concentration, hyperactive, hyperventilation   Prednisone  Swelling    short course=swelling    Immunization History  Administered Date(s) Administered   DTaP 12/25/2009   Fluad  Quad(high Dose 65+) 12/21/2018, 02/14/2020, 01/08/2021, 01/15/2022   Fluad  Trivalent(High  Dose 65+) 12/19/2022   INFLUENZA, HIGH DOSE SEASONAL PF 02/26/2015, 04/24/2016, 01/20/2017, 03/10/2018   Influenza Split 01/17/2011   Influenza,inj,Quad PF,6+ Mos 01/07/2013, 01/13/2014   PFIZER Comirnaty (Gray Top)Covid-19 Tri-Sucrose Vaccine 01/15/2022   PFIZER(Purple Top)SARS-COV-2 Vaccination 05/06/2019, 05/27/2019, 12/06/2019   Pfizer Covid-19 Vaccine Bivalent Booster 43yrs & up 01/28/2021   Pfizer(Comirnaty )Fall Seasonal Vaccine 12 years and older 01/15/2022, 12/19/2022   Pneumococcal Conjugate-13 04/19/2013   Pneumococcal Polysaccharide-23 05/02/2015   Tdap 03/31/2008   Zoster Recombinant(Shingrix) 10/03/2020, 03/01/2021    Past Medical History:  Diagnosis Date   Abnormal Pap smear    h/o   Allergy    seasonal   Allergy to  alpha-gal    Arthritis    Asthma    d/t seasonal allergies   Collagen vascular disease (HCC)    COVID    Elevated LFTs    Work up negative - Dr. Obie   Emphysema lung [facility])    Hyperlipidemia    Hyperplastic colon polyp    Hypertension    Internal hemorrhoids    INTERNAL HEMORRHOIDS 01/24/2009   Qualifier: Diagnosis of  By: Marcelo CMA (AAMA), Dottie     Migraine    Pneumonia    Prothrombin gene mutation (HCC)    heterozygous   PVD (peripheral vascular disease) (HCC)    takes Pletal  to open blood flow of blood to fingers, toes, and lower extremities   Raynaud's disease    Vasculitis (HCC)    Venous insufficiency    superficial left leg- seeing a vein specialist in August    Tobacco History: Social History   Tobacco Use  Smoking Status Former   Current packs/day: 0.00   Average packs/day: 0.5 packs/day for 18.0 years (9.0 ttl pk-yrs)   Types: Cigarettes   Start date: 10/11/1970   Quit date: 10/10/1988   Years since quitting: 35.2  Smokeless Tobacco Never  Tobacco Comments   Stopped smoking 2008.   Counseling given: Not Answered Tobacco comments: Stopped smoking 2008.   Continue to not smoke  Outpatient Encounter Medications as of 12/18/2023  Medication Sig   acetaminophen  (TYLENOL ) 500 MG tablet Take 1,000 mg by mouth every 6 (six) hours as needed for mild pain.   ALPRAZolam  (XANAX ) 0.5 MG tablet Take 1 tablet (0.5 mg total) by mouth every 8 (eight) hours as needed for anxiety. Do not drive for 8 hours after use.   amLODipine  (NORVASC ) 5 MG tablet Take 1 tablet (5 mg total) by mouth 2 (two) times daily.   amoxicillin -clavulanate (AUGMENTIN ) 875-125 MG tablet Take 1 tablet by mouth 2 (two) times daily for 7 days.   aspirin  81 MG chewable tablet Chew 81 mg by mouth daily.   cilostazol  (PLETAL ) 100 MG tablet Take 1 tablet (100 mg total) by mouth 2 (two) times daily.   cyanocobalamin  1000 MCG tablet Take 1,000 mcg by mouth daily.   EPINEPHrine  0.3 mg/0.3 mL IJ  SOAJ injection Inject 0.3 mg into the muscle as needed for anaphylaxis.   estradiol  (CLIMARA  - DOSED IN MG/24 HR) 0.0375 mg/24hr patch Place 1 patch onto the skin once a week   estradiol  (CLIMARA  - DOSED IN MG/24 HR) 0.0375 mg/24hr patch Place 1 patch (0.0375 mg total) onto the skin once a week.   FLUoxetine  (PROZAC ) 40 MG capsule Take 1 capsule (40 mg total) by mouth daily.   HYDROcodone -acetaminophen  (NORCO) 10-325 MG tablet TAKE (1) TABLET EVERY FOUR TO SIX HOURS AS NEEDED. Do not drive for 6 hours after use.   isosorbide  mononitrate (IMDUR )  60 MG 24 hr tablet Take 1 tablet (60 mg total) by mouth daily.   ondansetron  (ZOFRAN -ODT) 4 MG disintegrating tablet Take 1 tablet (4 mg total) by mouth every 8 (eight) hours as needed for nausea or vomiting.   potassium chloride  SA (KLOR-CON  M) 20 MEQ tablet Take 1 tablet (20 mEq total) by mouth in the morning AND 2 tablets (40 mEq total) every evening.   Rimegepant Sulfate  (NURTEC) 75 MG TBDP Take 75 mg by mouth daily as needed.   rosuvastatin  (CRESTOR ) 10 MG tablet Alternating 10 mg / 20 mg every other day   triamterene -hydrochlorothiazide  (MAXZIDE -25) 37.5-25 MG tablet Take 1 tablet by mouth daily.   [DISCONTINUED] albuterol  (PROAIR  HFA) 108 (90 Base) MCG/ACT inhaler Inhale 1-2 puffs into the lungs every 6 (six) hours as needed for wheezing or shortness of breath.   [DISCONTINUED] budesonide -formoterol  (SYMBICORT ) 160-4.5 MCG/ACT inhaler Inhale 2 puffs into the lungs 2 (two) times daily. (Patient taking differently: Inhale 2 puffs into the lungs 2 (two) times daily.)   albuterol  (PROAIR  HFA) 108 (90 Base) MCG/ACT inhaler Inhale 2 puffs into the lungs every 6 (six) hours as needed for wheezing or shortness of breath.   budesonide -formoterol  (SYMBICORT ) 160-4.5 MCG/ACT inhaler Inhale 2 puffs into the lungs 2 (two) times daily.   [DISCONTINUED] spironolactone  (ALDACTONE ) 25 MG tablet Take 1 tablet (25 mg total) by mouth once daily   No facility-administered  encounter medications on file as of 12/18/2023.     Review of Systems  Review of Systems  N/a Physical Exam  BP 122/60   Pulse 71   Temp 98 F (36.7 C) (Oral)   Ht 5' 1 (1.549 m)   Wt 132 lb 9.6 oz (60.1 kg)   LMP 03/31/1977   SpO2 94% Comment: room air  BMI 25.05 kg/m   Wt Readings from Last 5 Encounters:  12/18/23 132 lb 9.6 oz (60.1 kg)  12/07/23 130 lb (59 kg)  12/04/23 133 lb 6.4 oz (60.5 kg)  06/10/23 137 lb (62.1 kg)  05/12/23 139 lb (63 kg)    BMI Readings from Last 5 Encounters:  12/18/23 25.05 kg/m  12/07/23 24.56 kg/m  12/04/23 25.62 kg/m  06/10/23 25.89 kg/m  05/12/23 26.26 kg/m     Physical Exam General: Sitting in chair, no acute distress Eyes: EOMI, no icterus Neck: Supple, no JVP Pulmonary: Clear, normal work of breathing Cardiovascular: Warm, no edema Abdomen: Nondistended, bowel sounds present MSK: No synovitis, no joint effusion Neuro: Normal gait, no weakness Psych: Normal mood, full affect   Assessment & Plan:   Chronic cough: Suspect multifactorial.  Related to history of smoking, emphysema on CT versus asthma.  Historically improved with the Symbicort .  Now with worsening over last few months.  Cyclical bouts of bronchitis with multiple weeks of symptoms.  High suspicion for cough variant asthma.  Continue Symbicort  use, regular use has improved symptoms.  Symbicort  and albuterol  refilled today.  Pulmonary nodule: Increase in size right lower lobe for about 6 mm to 12 mm over 16 months or so.  High suspicion for malignancy.  Underwent bronchoscopic biopsy 12/07/2023 with no definitive diagnosis but no malignant cells seen.  However, this is good news and decreases likelihood of malignancy.  Agreed upon 56-month follow-up CT scan to be performed 01/2024.  Return for f/u Dr. Annella, after CT scan.   Donnice JONELLE Annella, MD

## 2023-12-19 ENCOUNTER — Other Ambulatory Visit (HOSPITAL_BASED_OUTPATIENT_CLINIC_OR_DEPARTMENT_OTHER): Payer: Self-pay

## 2023-12-23 ENCOUNTER — Other Ambulatory Visit (HOSPITAL_BASED_OUTPATIENT_CLINIC_OR_DEPARTMENT_OTHER): Payer: Self-pay

## 2023-12-24 ENCOUNTER — Other Ambulatory Visit: Payer: Self-pay

## 2023-12-24 ENCOUNTER — Other Ambulatory Visit (HOSPITAL_BASED_OUTPATIENT_CLINIC_OR_DEPARTMENT_OTHER): Payer: Self-pay

## 2023-12-28 ENCOUNTER — Other Ambulatory Visit (HOSPITAL_BASED_OUTPATIENT_CLINIC_OR_DEPARTMENT_OTHER): Payer: Self-pay

## 2024-01-05 LAB — FUNGUS CULTURE RESULT

## 2024-01-05 LAB — FUNGUS CULTURE WITH STAIN

## 2024-01-05 LAB — FUNGAL ORGANISM REFLEX

## 2024-01-07 ENCOUNTER — Other Ambulatory Visit (HOSPITAL_BASED_OUTPATIENT_CLINIC_OR_DEPARTMENT_OTHER): Payer: Self-pay

## 2024-01-08 ENCOUNTER — Other Ambulatory Visit (HOSPITAL_BASED_OUTPATIENT_CLINIC_OR_DEPARTMENT_OTHER): Payer: Self-pay

## 2024-01-09 ENCOUNTER — Other Ambulatory Visit (HOSPITAL_BASED_OUTPATIENT_CLINIC_OR_DEPARTMENT_OTHER): Payer: Self-pay

## 2024-01-11 ENCOUNTER — Other Ambulatory Visit (HOSPITAL_BASED_OUTPATIENT_CLINIC_OR_DEPARTMENT_OTHER): Payer: Self-pay

## 2024-01-11 ENCOUNTER — Other Ambulatory Visit: Payer: Self-pay

## 2024-01-11 ENCOUNTER — Encounter: Payer: Self-pay | Admitting: Family Medicine

## 2024-01-11 MED ORDER — EPINEPHRINE 0.3 MG/0.3ML IJ SOAJ
0.3000 mg | INTRAMUSCULAR | 0 refills | Status: AC | PRN
  Filled 2024-01-11: qty 2, 30d supply, fill #0

## 2024-01-20 LAB — ACID FAST CULTURE WITH REFLEXED SENSITIVITIES (MYCOBACTERIA)
Acid Fast Culture: NEGATIVE
Acid Fast Culture: NEGATIVE

## 2024-01-25 ENCOUNTER — Other Ambulatory Visit (HOSPITAL_BASED_OUTPATIENT_CLINIC_OR_DEPARTMENT_OTHER): Payer: Self-pay

## 2024-01-25 MED ORDER — COMIRNATY 30 MCG/0.3ML IM SUSY
0.3000 mL | PREFILLED_SYRINGE | Freq: Once | INTRAMUSCULAR | 0 refills | Status: AC
Start: 2024-01-25 — End: 2024-01-26
  Filled 2024-01-25: qty 0.3, 1d supply, fill #0

## 2024-01-25 MED ORDER — FLUZONE HIGH-DOSE 0.5 ML IM SUSY
0.5000 mL | PREFILLED_SYRINGE | Freq: Once | INTRAMUSCULAR | 0 refills | Status: AC
  Filled 2024-01-25: qty 0.5, 1d supply, fill #0

## 2024-02-03 DIAGNOSIS — K080 Exfoliation of teeth due to systemic causes: Secondary | ICD-10-CM | POA: Diagnosis not present

## 2024-02-09 ENCOUNTER — Other Ambulatory Visit: Payer: Self-pay | Admitting: Family Medicine

## 2024-02-09 ENCOUNTER — Other Ambulatory Visit (HOSPITAL_BASED_OUTPATIENT_CLINIC_OR_DEPARTMENT_OTHER): Payer: Self-pay

## 2024-02-09 MED ORDER — NURTEC 75 MG PO TBDP
75.0000 mg | ORAL_TABLET | Freq: Every day | ORAL | 5 refills | Status: AC | PRN
  Filled 2024-02-09: qty 16, 30d supply, fill #0
  Filled 2024-03-04 – 2024-03-08 (×3): qty 16, 30d supply, fill #1

## 2024-02-13 DIAGNOSIS — M79601 Pain in right arm: Secondary | ICD-10-CM | POA: Diagnosis not present

## 2024-02-13 DIAGNOSIS — S41159A Open bite of unspecified upper arm, initial encounter: Secondary | ICD-10-CM | POA: Diagnosis not present

## 2024-02-13 DIAGNOSIS — W540XXA Bitten by dog, initial encounter: Secondary | ICD-10-CM | POA: Diagnosis not present

## 2024-02-14 ENCOUNTER — Other Ambulatory Visit (HOSPITAL_BASED_OUTPATIENT_CLINIC_OR_DEPARTMENT_OTHER): Payer: Self-pay

## 2024-02-14 MED ORDER — AMOXICILLIN-POT CLAVULANATE 875-125 MG PO TABS
1.0000 | ORAL_TABLET | Freq: Two times a day (BID) | ORAL | 0 refills | Status: DC
  Filled 2024-02-14: qty 14, 7d supply, fill #0

## 2024-02-15 ENCOUNTER — Other Ambulatory Visit (HOSPITAL_BASED_OUTPATIENT_CLINIC_OR_DEPARTMENT_OTHER): Payer: Self-pay

## 2024-02-16 ENCOUNTER — Ambulatory Visit: Payer: Self-pay | Admitting: Pulmonary Disease

## 2024-02-16 ENCOUNTER — Ambulatory Visit
Admission: RE | Admit: 2024-02-16 | Discharge: 2024-02-16 | Disposition: A | Discharge status: Home / Self Care | Source: Ambulatory Visit | Attending: Pulmonary Disease | Admitting: Pulmonary Disease

## 2024-02-16 DIAGNOSIS — J432 Centrilobular emphysema: Secondary | ICD-10-CM | POA: Diagnosis not present

## 2024-02-16 DIAGNOSIS — R911 Solitary pulmonary nodule: Secondary | ICD-10-CM

## 2024-02-22 ENCOUNTER — Other Ambulatory Visit

## 2024-02-24 ENCOUNTER — Encounter: Payer: Self-pay | Admitting: Pulmonary Disease

## 2024-02-24 ENCOUNTER — Ambulatory Visit: Admitting: Pulmonary Disease

## 2024-02-24 VITALS — BP 124/74 | HR 63 | Temp 97.5°F | Ht 61.0 in | Wt 138.0 lb

## 2024-02-24 DIAGNOSIS — R053 Chronic cough: Secondary | ICD-10-CM | POA: Diagnosis not present

## 2024-02-24 DIAGNOSIS — R911 Solitary pulmonary nodule: Secondary | ICD-10-CM

## 2024-02-24 NOTE — Progress Notes (Signed)
 "  @Patient  ID: Kari Welch, female    DOB: 1945/04/24, 78 y.o.   MRN: [id number]  Chief Complaint  Patient presents with   Follow-up    Review CT chest from 02/16/2024.    Referring provider: Katrinka Garnette KIDD, MD  HPI:   78 y.o. woman whom we are seeing for evaluation of chronic cough felt to be related to poorly controlled asthma.  Returns for follow-up.  Of repeat CT scan.  Following nondiagnostic bronchoscopy.  This reveals smaller size and right lower lobe nodule.  Discussed in detail.  Discussed continue inhalers for cough.  HPI initial visit: Patient complains of cough.  Present for months.  At least 6 months if not longer.  Typically worse in the mornings.  Hacking cough.  Sometimes brings up phlegm.  Within the also has coughing flares throughout the day.  Seems to be some variability in terms of severity and frequency day-to-day.  No position to make things better or worse.  No seasonal or environmental factors she can identify to make things better or worse.  No really alleviating or exacerbating factors.  Recently prescribed Symbicort .   She had chest imaging 05/2022, chest x-ray that on my review and interpretation reveals clear lungs with mild hyperinflation.  More recently, she had a CT chest that on my review interpretation shows fairly significant emphysematous changes with a predilection for the upper lobes, otherwise clear lungs, scattered nodules.   Questionaires / Pulmonary Flowsheets:   ACT:      No data to display          MMRC:     No data to display          Epworth:      No data to display          Tests:   FENO:  No results found for: NITRICOXIDE  PFT:     No data to display          WALK:      No data to display          Imaging: Personally reviewed and as per EMR CT Super D Chest Wo Contrast Result Date: 02/16/2024 EXAM: CT CHEST WITHOUT CONTRAST 02/16/2024 10:33:30 AM TECHNIQUE: CT of the chest was  performed without the administration of intravenous contrast. Multiplanar reformatted images are provided for review. Automated exposure control, iterative reconstruction, and/or weight based adjustment of the mA/kV was utilized to reduce the radiation dose to as low as reasonably achievable. COMPARISON: Chest CT of 11/03/2023. CLINICAL HISTORY: Follow up of pulmonary nodules. Prior benign biopsy 9/25.bronchitis 8 weeks ago. FINDINGS: MEDIASTINUM: Mild cardiomegaly, without pericardial effusion. Left main, LAD, right coronary artery calcification. Bovine arch. Aortic atherosclerosis. Pulmonary artery enlargement, outflow tract 3.5 cm. The central airways are clear. Subtle air fluid level in the distal esophagus on image 113 / 2. LYMPH NODES: No mediastinal, hilar or axillary lymphadenopathy. LUNGS AND PLEURA: Mild to moderate centrilobular emphysema. Biapical pleuroparenchymal scarring. Chronic lingular volume loss and scarring. The medial right lower lobe subpleural area of nodularity is immediately adjacent to a presumed biopsy fiducial. Measures 10 x 6 mm on image 115 / 4 versus 12 x 9 mm on the prior exam. Slightly less well defined today. Just caudal to this, a right lower lobe nodule at 2 mm on image 119 / 4 is unchanged. Minimal soft tissue thickening along a pulmonary bulla within the inferior right upper lobe is similar on image 68 / 4. No pleural effusion or  pneumothorax. SOFT TISSUES/BONES: At least 1 remote anterior lower right rib fracture. No acute abnormality of the soft tissues. UPPER ABDOMEN: Limited images of the upper abdomen demonstrates no acute abnormality. IMPRESSION: 1. The pleural-based medial right lower lobe area of presumed nodular consolidation is decreased since the prior exam, favoring an infectious or inflammatory etiology. 2. No acute process in the chest 3. Esophageal fluid level suggests dysmotility and/or gastroesophageal reflux. 4. Pulmonary artery enlargement suggests pulmonary  arterial hypertension. 5. Aortic atherosclerosis (icd10-i70.0), coronary artery atherosclerosis, and emphysema (icd10-j43.9). Electronically signed by: Rockey Kilts MD 02/16/2024 11:06 AM EST RP Workstation: [id number]    Lab Results: Personally reviewed CBC    Component Value Date/Time   WBC 6.8 12/07/2023 0616   RBC 3.47 (L) 12/07/2023 0616   HGB 12.3 12/07/2023 0616   HCT 35.6 (L) 12/07/2023 0616   PLT 303 12/07/2023 0616   MCV 102.6 (H) 12/07/2023 0616   MCH 35.4 (H) 12/07/2023 0616   MCHC 34.6 12/07/2023 0616   RDW 13.9 12/07/2023 0616   LYMPHSABS 1.5 05/12/2023 1101   MONOABS 0.6 05/12/2023 1101   EOSABS 0.1 05/12/2023 1101   BASOSABS 0.1 05/12/2023 1101    BMET    Component Value Date/Time   NA 137 12/07/2023 0616   K 3.4 (L) 12/07/2023 0616   CL 106 12/07/2023 0616   CO2 22 12/07/2023 0616   GLUCOSE 101 (H) 12/07/2023 0616   GLUCOSE 83 01/27/2006 1158   BUN 22 12/07/2023 0616   CREATININE 1.05 (H) 12/07/2023 0616   CREATININE 1.05 (H) 03/15/2020 1155   CALCIUM  8.5 (L) 12/07/2023 0616   GFRNONAA 55 (L) 12/07/2023 0616   GFRNONAA 53 (L) 03/15/2020 1155   GFRAA 61 03/15/2020 1155    BNP No results found for: BNP  ProBNP    Component Value Date/Time   PROBNP 34.0 07/16/2022 1000    Specialty Problems       Pulmonary Problems   Emphysema lung (HCC)   Noted on CT April 2024      Lung nodule    Allergies  Allergen Reactions   Alpha-Gal Hives   Methylprednisolone  Other (See Comments)    With IM injection- Panic attacks, trouble with concentration, hyperactive, hyperventilation   Prednisone  Swelling    short course=swelling    Immunization History  Administered Date(s) Administered   DTaP 12/25/2009   Fluad  Quad(high Dose 65+) 12/21/2018, 02/14/2020, 01/08/2021, 01/15/2022   Fluad  Trivalent(High Dose 65+) 12/19/2022   INFLUENZA, HIGH DOSE SEASONAL PF 02/26/2015, 04/24/2016, 01/20/2017, 03/10/2018, 01/25/2024   Influenza Split 01/17/2011    Influenza,inj,Quad PF,6+ Mos 01/07/2013, 01/13/2014   PFIZER Comirnaty (Gray Top)Covid-19 Tri-Sucrose Vaccine 01/15/2022   PFIZER(Purple Top)SARS-COV-2 Vaccination 05/06/2019, 05/27/2019, 12/06/2019   Pfizer Covid-19 Vaccine Bivalent Booster 44yrs & up 01/28/2021   Pfizer(Comirnaty )Fall Seasonal Vaccine 12 years and older 01/15/2022, 12/19/2022, 01/25/2024   Pneumococcal Conjugate-13 04/19/2013   Pneumococcal Polysaccharide-23 05/02/2015   Tdap 03/31/2008   Zoster Recombinant(Shingrix) 10/03/2020, 03/01/2021    Past Medical History:  Diagnosis Date   Abnormal Pap smear    h/o   Allergy    seasonal   Allergy to alpha-gal    Arthritis    Asthma    d/t seasonal allergies   Collagen vascular disease    COVID    Elevated LFTs    Work up negative - Dr. Obie   Emphysema lung [facility])    Hyperlipidemia    Hyperplastic colon polyp    Hypertension    Internal hemorrhoids    INTERNAL HEMORRHOIDS 01/24/2009  Qualifier: Diagnosis of  By: Nelson-Smith CMA (AAMA), Dottie     Migraine    Pneumonia    Prothrombin gene mutation    heterozygous   PVD (peripheral vascular disease)    takes Pletal  to open blood flow of blood to fingers, toes, and lower extremities   Raynaud's disease    Vasculitis    Venous insufficiency    superficial left leg- seeing a vein specialist in August    Tobacco History: Social History   Tobacco Use  Smoking Status Former   Current packs/day: 0.00   Average packs/day: 0.5 packs/day for 18.0 years (9.0 ttl pk-yrs)   Types: Cigarettes   Start date: 10/11/1970   Quit date: 2008   Years since quitting: 17.9  Smokeless Tobacco Never  Tobacco Comments   Stopped smoking 2008.   Counseling given: Not Answered Tobacco comments: Stopped smoking 2008.   Continue to not smoke  Outpatient Encounter Medications as of 02/24/2024  Medication Sig   acetaminophen  (TYLENOL ) 500 MG tablet Take 1,000 mg by mouth every 6 (six) hours as needed for mild pain.    albuterol  (PROAIR  HFA) 108 (90 Base) MCG/ACT inhaler Inhale 2 puffs into the lungs every 6 (six) hours as needed for wheezing or shortness of breath.   ALPRAZolam  (XANAX ) 0.5 MG tablet Take 1 tablet (0.5 mg total) by mouth every 8 (eight) hours as needed for anxiety. Do not drive for 8 hours after use.   amLODipine  (NORVASC ) 5 MG tablet Take 1 tablet (5 mg total) by mouth 2 (two) times daily.   aspirin  81 MG chewable tablet Chew 81 mg by mouth daily.   budesonide -formoterol  (SYMBICORT ) 160-4.5 MCG/ACT inhaler Inhale 2 puffs into the lungs 2 (two) times daily.   cilostazol  (PLETAL ) 100 MG tablet Take 1 tablet (100 mg total) by mouth 2 (two) times daily.   cyanocobalamin  1000 MCG tablet Take 1,000 mcg by mouth daily.   EPINEPHrine  0.3 mg/0.3 mL IJ SOAJ injection Inject 0.3 mg into the muscle as needed for anaphylaxis.   estradiol  (CLIMARA  - DOSED IN MG/24 HR) 0.0375 mg/24hr patch Place 1 patch onto the skin once a week   estradiol  (CLIMARA  - DOSED IN MG/24 HR) 0.0375 mg/24hr patch Place 1 patch (0.0375 mg total) onto the skin once a week.   FLUoxetine  (PROZAC ) 40 MG capsule Take 1 capsule (40 mg total) by mouth daily.   HYDROcodone -acetaminophen  (NORCO) 10-325 MG tablet TAKE (1) TABLET EVERY FOUR TO SIX HOURS AS NEEDED. Do not drive for 6 hours after use.   isosorbide  mononitrate (IMDUR ) 60 MG 24 hr tablet Take 1 tablet (60 mg total) by mouth daily.   ondansetron  (ZOFRAN -ODT) 4 MG disintegrating tablet Take 1 tablet (4 mg total) by mouth every 8 (eight) hours as needed for nausea or vomiting.   potassium chloride  SA (KLOR-CON  M) 20 MEQ tablet Take 1 tablet (20 mEq total) by mouth in the morning AND 2 tablets (40 mEq total) every evening.   Rimegepant Sulfate  (NURTEC) 75 MG TBDP Take 75 mg by mouth daily as needed.   rosuvastatin  (CRESTOR ) 10 MG tablet Alternate 1 tablet (10 mg) by mouth and 2 tablets (20 mg) by mouth every other night.   rosuvastatin  (CRESTOR ) 10 MG tablet Alternating 10 mg / 20 mg  every other day   triamterene -hydrochlorothiazide  (MAXZIDE -25) 37.5-25 MG tablet Take 1 tablet by mouth daily.   [DISCONTINUED] amoxicillin -clavulanate (AUGMENTIN ) 875-125 MG tablet Take 1 tablet by mouth 2 (two) times daily with food.   [DISCONTINUED] spironolactone  (  ALDACTONE ) 25 MG tablet Take 1 tablet (25 mg total) by mouth once daily   No facility-administered encounter medications on file as of 02/24/2024.     Review of Systems  Review of Systems  N/a Physical Exam  BP 124/74   Pulse 63   Temp (!) 97.5 F (36.4 C) (Oral)   Ht 5' 1 (1.549 m)   Wt 138 lb (62.6 kg)   LMP 03/31/1977   SpO2 96% Comment: room air  BMI 26.07 kg/m   Wt Readings from Last 5 Encounters:  02/24/24 138 lb (62.6 kg)  12/18/23 132 lb 9.6 oz (60.1 kg)  12/07/23 130 lb (59 kg)  12/04/23 133 lb 6.4 oz (60.5 kg)  06/10/23 137 lb (62.1 kg)    BMI Readings from Last 5 Encounters:  02/24/24 26.07 kg/m  12/18/23 25.05 kg/m  12/07/23 24.56 kg/m  12/04/23 25.62 kg/m  06/10/23 25.89 kg/m     Physical Exam General: Sitting in chair, no acute distress Eyes: EOMI, no icterus Neck: Supple, no JVP Pulmonary: Clear, normal work of breathing Cardiovascular: Warm, no edema Abdomen: Nondistended, bowel sounds present MSK: No synovitis, no joint effusion Neuro: Normal gait, no weakness Psych: Normal mood, full affect   Assessment & Plan:   Chronic cough: Suspect multifactorial.  Related to history of smoking, emphysema on CT versus asthma.  Historically improved with the Symbicort .  Now with worsening over last few months.  Cyclical bouts of bronchitis with multiple weeks of symptoms.  High suspicion for cough variant asthma.  Continue Symbicort  use, regular use has improved symptoms.  Flares typically respond to antibiotics and steroids.  Pulmonary nodule: Waxing waning terms of size.  Likely enlarged in setting of acute mucus impaction.  Repeat scan 01/2024 with subsequent decrease in size.  No  further imaging planned.  No follow-ups on file.   Donnice JONELLE Beals, MD   "

## 2024-03-02 ENCOUNTER — Other Ambulatory Visit (HOSPITAL_COMMUNITY): Payer: Self-pay

## 2024-03-04 ENCOUNTER — Other Ambulatory Visit (HOSPITAL_BASED_OUTPATIENT_CLINIC_OR_DEPARTMENT_OTHER): Payer: Self-pay

## 2024-03-04 ENCOUNTER — Other Ambulatory Visit: Payer: Self-pay

## 2024-03-07 ENCOUNTER — Other Ambulatory Visit (HOSPITAL_BASED_OUTPATIENT_CLINIC_OR_DEPARTMENT_OTHER): Payer: Self-pay

## 2024-03-08 ENCOUNTER — Other Ambulatory Visit (HOSPITAL_BASED_OUTPATIENT_CLINIC_OR_DEPARTMENT_OTHER): Payer: Self-pay

## 2024-03-08 ENCOUNTER — Other Ambulatory Visit (HOSPITAL_COMMUNITY): Payer: Self-pay

## 2024-03-10 ENCOUNTER — Other Ambulatory Visit: Payer: Self-pay

## 2024-03-18 ENCOUNTER — Other Ambulatory Visit: Payer: Self-pay | Admitting: Family Medicine

## 2024-03-18 ENCOUNTER — Other Ambulatory Visit (HOSPITAL_BASED_OUTPATIENT_CLINIC_OR_DEPARTMENT_OTHER): Payer: Self-pay

## 2024-03-18 MED ORDER — ONDANSETRON HCL 4 MG PO TABS
4.0000 mg | ORAL_TABLET | Freq: Three times a day (TID) | ORAL | 0 refills | Status: AC | PRN
  Filled 2024-03-18: qty 20, 7d supply, fill #0

## 2024-03-18 NOTE — Telephone Encounter (Signed)
 Pt requesting the script changed to not ODT.

## 2024-03-21 ENCOUNTER — Other Ambulatory Visit: Payer: Self-pay

## 2024-03-21 ENCOUNTER — Encounter: Payer: Self-pay | Admitting: Pulmonary Disease

## 2024-03-21 ENCOUNTER — Other Ambulatory Visit (HOSPITAL_BASED_OUTPATIENT_CLINIC_OR_DEPARTMENT_OTHER): Payer: Self-pay

## 2024-03-22 ENCOUNTER — Ambulatory Visit: Payer: Self-pay | Admitting: Pulmonary Disease

## 2024-03-22 ENCOUNTER — Other Ambulatory Visit (HOSPITAL_BASED_OUTPATIENT_CLINIC_OR_DEPARTMENT_OTHER): Payer: Self-pay

## 2024-03-22 ENCOUNTER — Other Ambulatory Visit: Payer: Self-pay

## 2024-03-22 MED ORDER — BENZONATATE 200 MG PO CAPS
200.0000 mg | ORAL_CAPSULE | Freq: Three times a day (TID) | ORAL | 0 refills | Status: AC | PRN
  Filled 2024-03-22: qty 45, 15d supply, fill #0

## 2024-03-22 MED ORDER — AZITHROMYCIN 250 MG PO TABS
ORAL_TABLET | ORAL | 0 refills | Status: AC
  Filled 2024-03-22: qty 6, 5d supply, fill #0

## 2024-03-22 NOTE — Telephone Encounter (Signed)
 Husband didn't want to make an appointment for the patient at this time unless Dr Annella wanted to see her. Husband calling to see if Dr Annella wanted the patient to have an antibiotic at this time or continue with home care. He would like a call back from the office with Dr [facility] recommendations or a MyChart message.  FYI Only or Action Required?: Action required by provider: clinical question for provider and update on patient condition.  Patient is followed in Pulmonology for chronic cough, lung nodule, dyspnea on exertion, last seen on 02/24/2024 by Hunsucker, Donnice SAUNDERS, MD.  Called Nurse Triage reporting Cough.  Symptoms began 48 hours ago.  Interventions attempted: OTC medications: Tylenol , Dayquil, Nyquil, Cough Drops, Prescription medications: Symbicort , and Increased fluids/rest.  Symptoms are: unchanged.  Triage Disposition: See Physician Within 24 Hours  Patient/caregiver understands and will follow disposition?: No, wishes to speak with PCP                 Copied from CRM #[id number]. Topic: Clinical - Red Word Triage >> Mar 22, 2024 11:21 AM Essie A wrote: Red Word that prompted transfer to Nurse Triage: recent flare up of bronchitis with cough, chest tightness and some yellow sputum.  No fever.  Started about 24 hours ago Reason for Disposition  [1] Known COPD or other severe lung disease (i.e., bronchiectasis, cystic fibrosis, lung surgery) AND [2] symptoms getting worse (i.e., increased sputum purulence or amount, increased breathing difficulty  Answer Assessment - Initial Assessment Questions Husband wants either a call back or MyChart message response from Dr Annella on his advice at this point. Husband states that at this point the cough is not continuous He states that for the past 48 hours the patient has had a cough with yellow sputum, she started Symbicort  yesterday, has no known fevers, and has a history of emphysema with periodic bouts  of bronchitis. Patient has been taking Tylenol , Dayquil, Nyquil, and using cough drops as needed. Husband denies the patient having any difficulty breathing. Husband didn't want to make an appointment for the patient at this time unless Dr Annella wanted to see her. Husband calling to see if Dr Annella wanted the patient to have an antibiotic at this time or continue with home care. He states if an antibiotic is advised and to be sent in--have it sent to the patient's pharmacy--[facility] on [address]  Patient has not had to use her rescue inhaler that her husband is aware of.  Patient's husband is advised to call us  back if anything changes or with any further questions/concerns. Patient's husband is advised that if anything worsens to go to the Emergency Room. Patient's husband verbalized understanding.   E2C2 Pulmonary Triage - Initial Assessment Questions  How long have symptoms been present? 48 hours ago  Have you tested for COVID or Flu? Note: If not, ask patient if a home test can be taken. If so, instruct patient to call back for positive results. No  MEDICINES:   Have you used any OTC meds to help with symptoms? Yes If yes, ask What medications? Tylenol , Dayquil, Nyquil, Cough drops  Have you used your inhalers/maintenance medication? Yes If yes, What medications? Symbicort   If inhaler, ask How many puffs and how often? Note: Review instructions on medication in the chart. ----  OXYGEN: Do you wear supplemental oxygen? No If yes, How many liters are you supposed to use? N/A  Do you monitor your oxygen levels? No If yes, What is  your reading (oxygen level) today? N/A  What is your usual oxygen saturation reading?  (Note: Pulmonary O2 sats should be 90% or greater) N/A       6. FEVER: Do you have a fever? If Yes, ask: What is your temperature, how was it measured, and when did it start?     No known  fevers  Protocols used: Cough - Acute Productive-A-AH

## 2024-03-22 NOTE — Telephone Encounter (Signed)
 Acute URI with 2 days of cough  Hx of Asthma and chronic cough   Recommend Delsym 2 teaspoons twice daily as needed for cough.  May have Tessalon  Perles 3 times daily as needed for cough.  Z-Pak to have on hold if symptoms worsen with discolored mucus. If not improving or worsens to seek emergency room or urgent care.  Office visit if not improving

## 2024-03-22 NOTE — Telephone Encounter (Signed)
Called and spoke with patient, advised of recommendations per Rexene Edison NP.  She verbalized understanding.  Nothing further needed.

## 2024-04-16 ENCOUNTER — Other Ambulatory Visit (HOSPITAL_BASED_OUTPATIENT_CLINIC_OR_DEPARTMENT_OTHER): Payer: Self-pay

## 2024-05-02 ENCOUNTER — Encounter: Payer: Self-pay | Admitting: Family Medicine

## 2024-05-03 NOTE — Telephone Encounter (Signed)
 Okay to order

## 2024-05-04 ENCOUNTER — Other Ambulatory Visit: Payer: Self-pay | Admitting: Family Medicine

## 2024-05-04 DIAGNOSIS — Z91018 Allergy to other foods: Secondary | ICD-10-CM

## 2024-05-04 DIAGNOSIS — I10 Essential (primary) hypertension: Secondary | ICD-10-CM

## 2024-05-04 DIAGNOSIS — E785 Hyperlipidemia, unspecified: Secondary | ICD-10-CM

## 2024-05-04 DIAGNOSIS — Z0000 Encounter for general adult medical examination without abnormal findings: Secondary | ICD-10-CM

## 2024-05-12 ENCOUNTER — Encounter: Payer: Medicare Other | Admitting: Family Medicine

## 2024-06-07 ENCOUNTER — Ambulatory Visit: Admitting: Pulmonary Disease

## 2024-10-27 ENCOUNTER — Ambulatory Visit: Admitting: Dermatology
